# Patient Record
Sex: Male | Born: 1960 | Race: White | Hispanic: No | Marital: Married | Smoking: Never smoker
Health system: Southern US, Community
[De-identification: ages and names within clinical notes are randomized; demographics above are authoritative.]

## PROBLEM LIST (undated history)

## (undated) DIAGNOSIS — M109 Gout, unspecified: Secondary | ICD-10-CM

## (undated) DIAGNOSIS — M1990 Unspecified osteoarthritis, unspecified site: Secondary | ICD-10-CM

## (undated) DIAGNOSIS — R7989 Other specified abnormal findings of blood chemistry: Secondary | ICD-10-CM

## (undated) DIAGNOSIS — C61 Malignant neoplasm of prostate: Secondary | ICD-10-CM

## (undated) HISTORY — PX: CYSTECTOMY: SUR359

## (undated) HISTORY — PX: BONE MARROW BIOPSY: SHX199

## (undated) HISTORY — PX: KNEE SURGERY: SHX244

## (undated) HISTORY — DX: Gout, unspecified: M10.9

## (undated) HISTORY — DX: Malignant neoplasm of prostate: C61

---

## 2003-12-05 ENCOUNTER — Other Ambulatory Visit: Payer: Self-pay

## 2003-12-06 ENCOUNTER — Other Ambulatory Visit: Payer: Self-pay

## 2004-05-03 ENCOUNTER — Ambulatory Visit: Payer: Self-pay | Admitting: Internal Medicine

## 2004-05-18 ENCOUNTER — Ambulatory Visit: Payer: Self-pay | Admitting: Internal Medicine

## 2004-06-28 ENCOUNTER — Ambulatory Visit: Payer: Self-pay | Admitting: Internal Medicine

## 2004-07-18 ENCOUNTER — Ambulatory Visit: Payer: Self-pay | Admitting: Internal Medicine

## 2004-08-18 ENCOUNTER — Ambulatory Visit: Payer: Self-pay | Admitting: Internal Medicine

## 2004-09-07 ENCOUNTER — Other Ambulatory Visit: Payer: Self-pay

## 2004-09-15 ENCOUNTER — Ambulatory Visit: Payer: Self-pay | Admitting: Internal Medicine

## 2004-10-16 ENCOUNTER — Ambulatory Visit: Payer: Self-pay | Admitting: Internal Medicine

## 2004-11-15 ENCOUNTER — Ambulatory Visit: Payer: Self-pay | Admitting: Internal Medicine

## 2004-12-16 ENCOUNTER — Ambulatory Visit: Payer: Self-pay | Admitting: Internal Medicine

## 2005-01-15 ENCOUNTER — Ambulatory Visit: Payer: Self-pay | Admitting: Internal Medicine

## 2005-02-15 ENCOUNTER — Ambulatory Visit: Payer: Self-pay | Admitting: Internal Medicine

## 2005-03-18 ENCOUNTER — Ambulatory Visit: Payer: Self-pay | Admitting: Internal Medicine

## 2005-04-17 ENCOUNTER — Ambulatory Visit: Payer: Self-pay | Admitting: Internal Medicine

## 2005-05-18 ENCOUNTER — Ambulatory Visit: Payer: Self-pay | Admitting: Internal Medicine

## 2005-07-08 ENCOUNTER — Ambulatory Visit: Payer: Self-pay | Admitting: Internal Medicine

## 2005-07-18 ENCOUNTER — Ambulatory Visit: Payer: Self-pay | Admitting: Internal Medicine

## 2005-08-18 ENCOUNTER — Ambulatory Visit: Payer: Self-pay | Admitting: Internal Medicine

## 2005-09-15 ENCOUNTER — Ambulatory Visit: Payer: Self-pay | Admitting: Internal Medicine

## 2005-10-16 ENCOUNTER — Ambulatory Visit: Payer: Self-pay | Admitting: Internal Medicine

## 2005-12-26 ENCOUNTER — Ambulatory Visit: Payer: Self-pay | Admitting: Internal Medicine

## 2006-01-15 ENCOUNTER — Ambulatory Visit: Payer: Self-pay | Admitting: Internal Medicine

## 2006-02-15 ENCOUNTER — Ambulatory Visit: Payer: Self-pay | Admitting: Internal Medicine

## 2006-03-24 ENCOUNTER — Ambulatory Visit: Payer: Self-pay | Admitting: Internal Medicine

## 2006-04-17 ENCOUNTER — Ambulatory Visit: Payer: Self-pay | Admitting: Internal Medicine

## 2006-05-18 ENCOUNTER — Ambulatory Visit: Payer: Self-pay | Admitting: Internal Medicine

## 2006-06-27 ENCOUNTER — Ambulatory Visit: Payer: Self-pay | Admitting: Internal Medicine

## 2006-07-18 ENCOUNTER — Ambulatory Visit: Payer: Self-pay | Admitting: Internal Medicine

## 2006-08-23 ENCOUNTER — Ambulatory Visit: Payer: Self-pay | Admitting: Internal Medicine

## 2006-09-16 ENCOUNTER — Ambulatory Visit: Payer: Self-pay | Admitting: Internal Medicine

## 2006-10-17 ENCOUNTER — Ambulatory Visit: Payer: Self-pay | Admitting: Internal Medicine

## 2006-11-16 ENCOUNTER — Ambulatory Visit: Payer: Self-pay | Admitting: Internal Medicine

## 2006-12-17 ENCOUNTER — Ambulatory Visit: Payer: Self-pay | Admitting: Internal Medicine

## 2007-02-12 ENCOUNTER — Ambulatory Visit: Payer: Self-pay | Admitting: Internal Medicine

## 2007-02-16 ENCOUNTER — Ambulatory Visit: Payer: Self-pay | Admitting: Internal Medicine

## 2007-03-19 ENCOUNTER — Ambulatory Visit: Payer: Self-pay | Admitting: Internal Medicine

## 2007-04-03 ENCOUNTER — Ambulatory Visit: Payer: Self-pay | Admitting: Internal Medicine

## 2007-04-18 ENCOUNTER — Ambulatory Visit: Payer: Self-pay | Admitting: Internal Medicine

## 2007-06-06 ENCOUNTER — Ambulatory Visit: Payer: Self-pay | Admitting: Internal Medicine

## 2007-06-18 ENCOUNTER — Ambulatory Visit: Payer: Self-pay | Admitting: Internal Medicine

## 2007-07-24 ENCOUNTER — Ambulatory Visit: Payer: Self-pay | Admitting: Internal Medicine

## 2007-08-19 ENCOUNTER — Ambulatory Visit: Payer: Self-pay | Admitting: Internal Medicine

## 2007-10-02 ENCOUNTER — Ambulatory Visit: Payer: Self-pay | Admitting: Internal Medicine

## 2007-10-17 ENCOUNTER — Ambulatory Visit: Payer: Self-pay | Admitting: Internal Medicine

## 2007-11-29 ENCOUNTER — Ambulatory Visit: Payer: Self-pay | Admitting: Internal Medicine

## 2007-12-17 ENCOUNTER — Ambulatory Visit: Payer: Self-pay | Admitting: Internal Medicine

## 2008-01-28 DIAGNOSIS — D239 Other benign neoplasm of skin, unspecified: Secondary | ICD-10-CM

## 2008-01-28 HISTORY — DX: Other benign neoplasm of skin, unspecified: D23.9

## 2008-01-30 ENCOUNTER — Ambulatory Visit: Payer: Self-pay | Admitting: Internal Medicine

## 2008-02-16 ENCOUNTER — Ambulatory Visit: Payer: Self-pay | Admitting: Internal Medicine

## 2008-03-25 ENCOUNTER — Ambulatory Visit: Payer: Self-pay | Admitting: Internal Medicine

## 2008-04-17 ENCOUNTER — Ambulatory Visit: Payer: Self-pay | Admitting: Internal Medicine

## 2008-05-18 ENCOUNTER — Ambulatory Visit: Payer: Self-pay | Admitting: Internal Medicine

## 2008-06-17 ENCOUNTER — Ambulatory Visit: Payer: Self-pay | Admitting: Internal Medicine

## 2008-07-04 ENCOUNTER — Ambulatory Visit: Payer: Self-pay | Admitting: Internal Medicine

## 2008-07-18 ENCOUNTER — Ambulatory Visit: Payer: Self-pay

## 2008-07-25 ENCOUNTER — Ambulatory Visit: Payer: Self-pay | Admitting: Family Medicine

## 2008-08-22 ENCOUNTER — Ambulatory Visit: Payer: Self-pay | Admitting: Neurology

## 2008-09-02 ENCOUNTER — Ambulatory Visit: Payer: Self-pay | Admitting: Internal Medicine

## 2008-09-09 ENCOUNTER — Encounter: Payer: Self-pay | Admitting: Neurology

## 2008-09-15 ENCOUNTER — Encounter: Payer: Self-pay | Admitting: Neurology

## 2008-09-15 ENCOUNTER — Ambulatory Visit: Payer: Self-pay | Admitting: Internal Medicine

## 2008-10-16 ENCOUNTER — Encounter: Payer: Self-pay | Admitting: Neurology

## 2008-10-16 ENCOUNTER — Ambulatory Visit: Payer: Self-pay | Admitting: Internal Medicine

## 2008-10-24 ENCOUNTER — Ambulatory Visit: Payer: Self-pay | Admitting: Internal Medicine

## 2008-11-15 ENCOUNTER — Encounter: Payer: Self-pay | Admitting: Neurology

## 2008-11-15 ENCOUNTER — Ambulatory Visit: Payer: Self-pay | Admitting: Internal Medicine

## 2008-12-16 ENCOUNTER — Ambulatory Visit: Payer: Self-pay | Admitting: Internal Medicine

## 2009-01-02 ENCOUNTER — Ambulatory Visit: Payer: Self-pay | Admitting: Internal Medicine

## 2009-01-15 ENCOUNTER — Ambulatory Visit: Payer: Self-pay | Admitting: Internal Medicine

## 2009-02-27 ENCOUNTER — Ambulatory Visit: Payer: Self-pay | Admitting: Internal Medicine

## 2009-03-18 ENCOUNTER — Ambulatory Visit: Payer: Self-pay | Admitting: Internal Medicine

## 2009-04-24 ENCOUNTER — Ambulatory Visit: Payer: Self-pay | Admitting: Internal Medicine

## 2009-05-18 ENCOUNTER — Ambulatory Visit: Payer: Self-pay | Admitting: Internal Medicine

## 2009-07-18 ENCOUNTER — Ambulatory Visit: Payer: Self-pay | Admitting: Internal Medicine

## 2009-07-20 IMAGING — CR CERVICAL SPINE - 2-3 VIEW
1 series · 4 of 4 positions shown · non-contrast
Comparison: none

REASON FOR EXAM: Pain
COMMENTS:

[Series 2: view not recorded · 0.17mm/px · 4 of 4 slices shown]
[im 1/4]
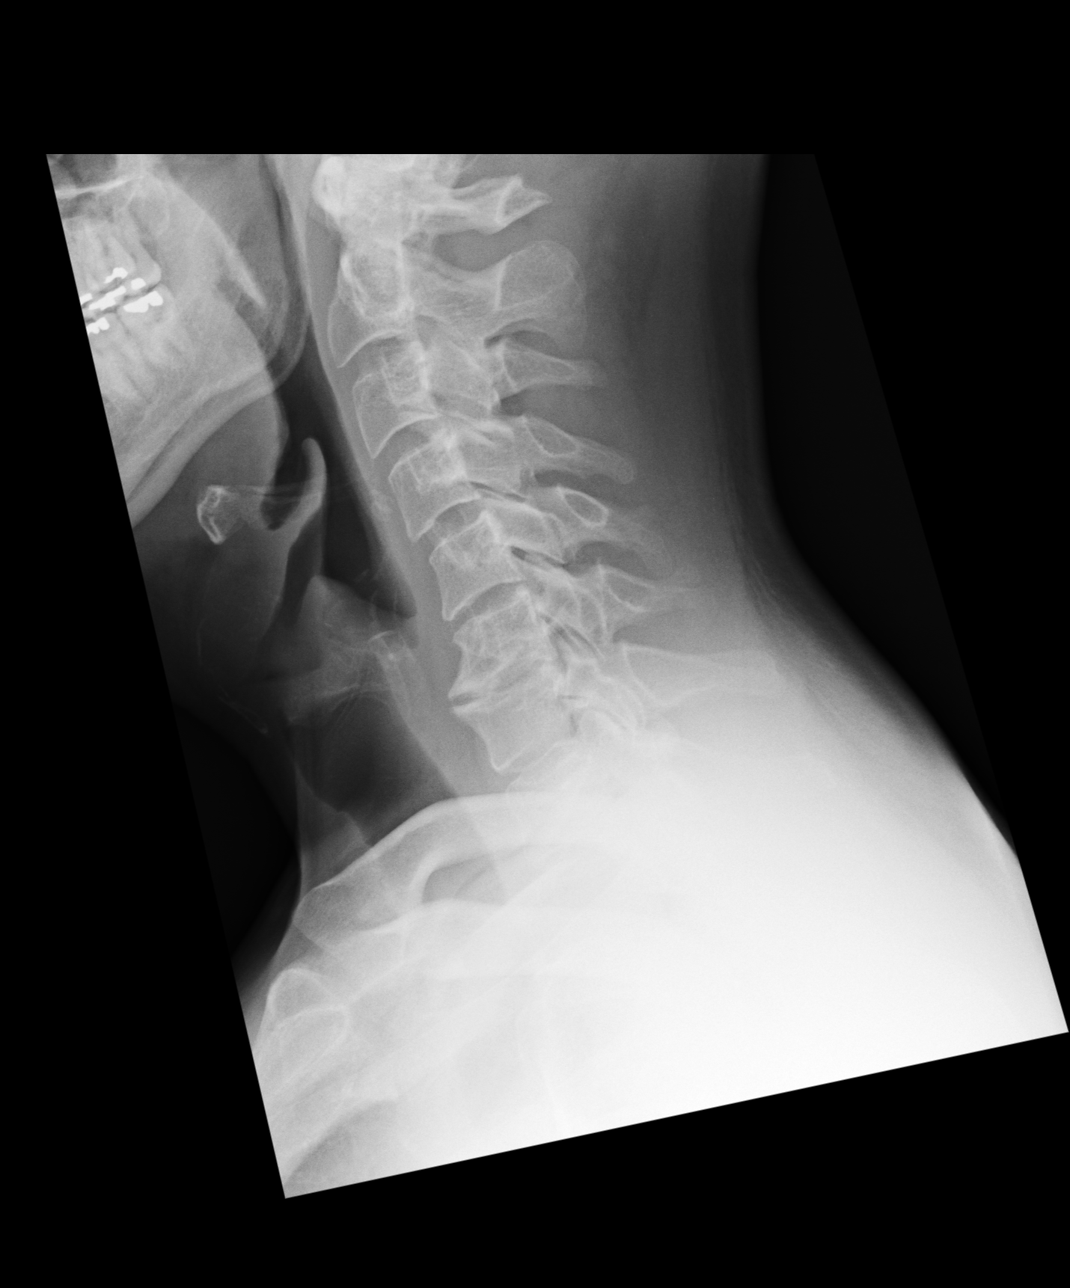
[im 2/4]
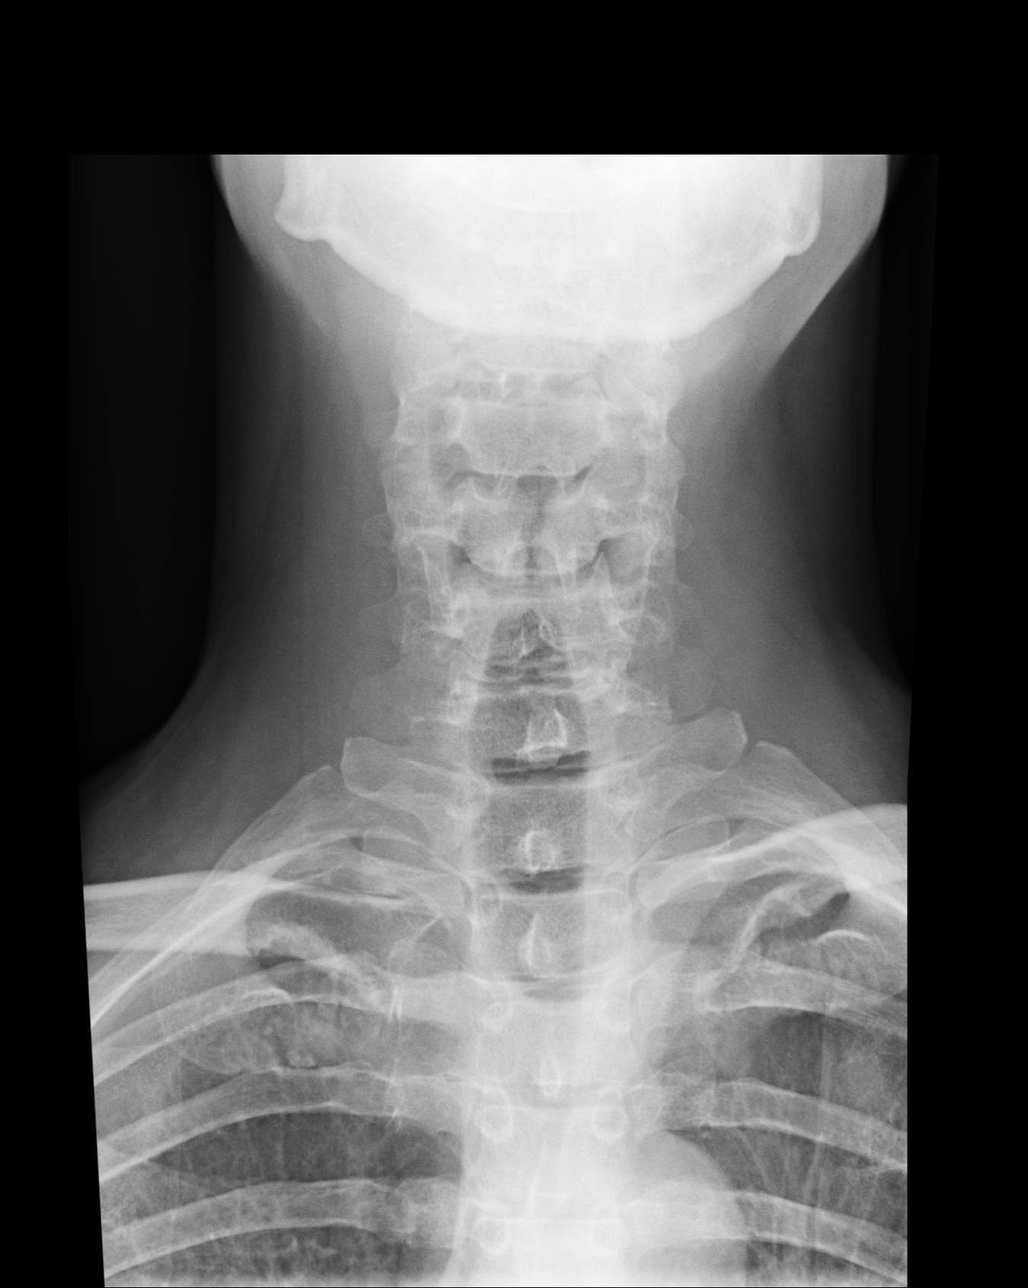
[im 3/4]
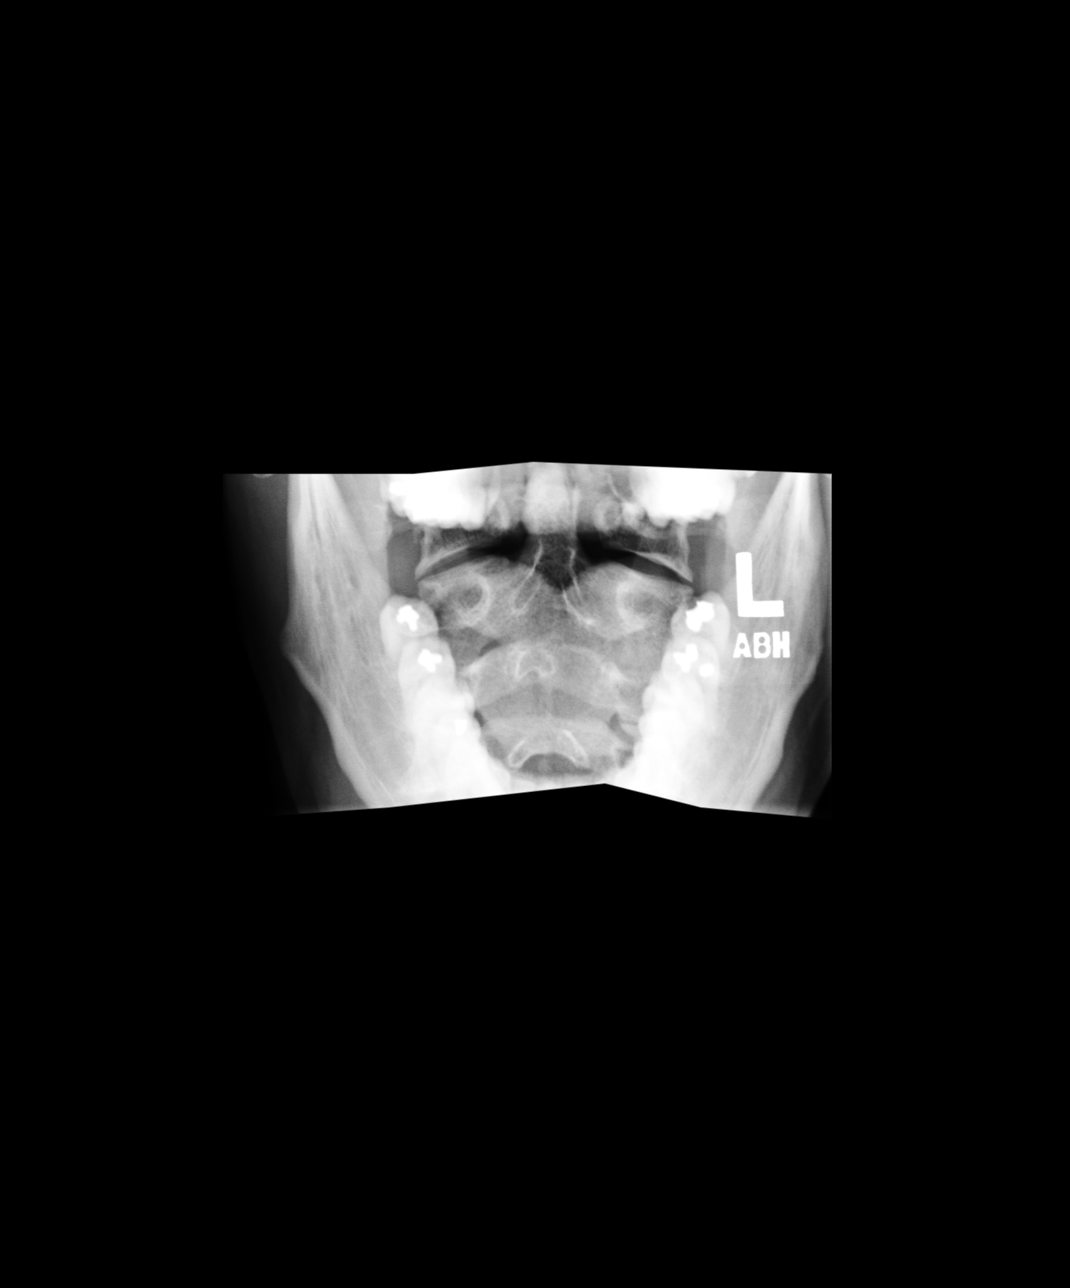
[im 4/4]
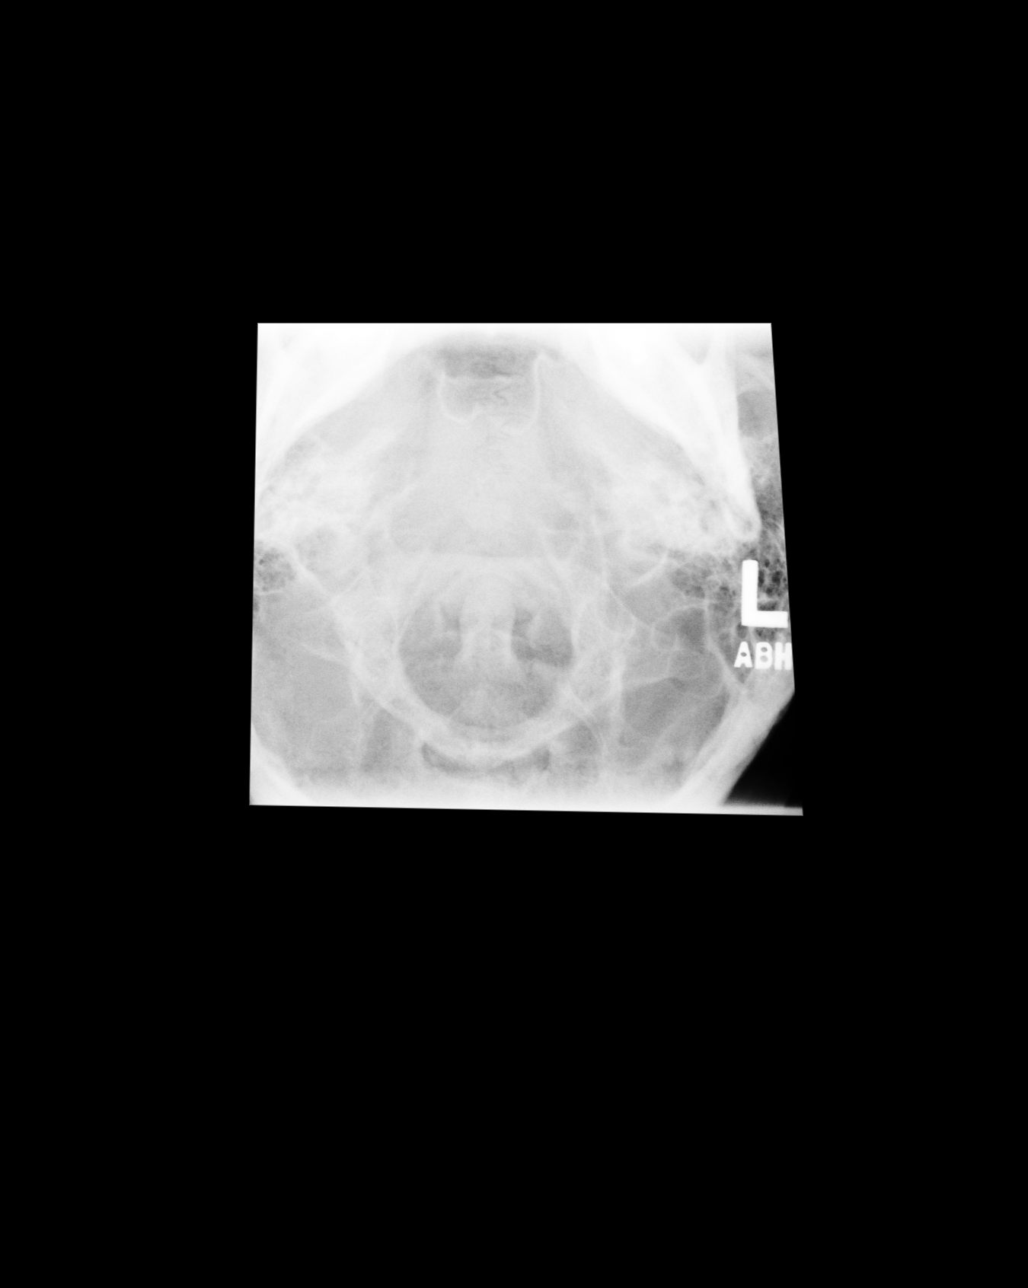

[4 of 4 positions shown; findings below may reference images not displayed]

PROCEDURE:     KDR - KDXR C-SPINE AP AND LATERAL  - July 25, 2008 [DATE]

RESULT:     The patient is reporting approximately one month history of left
upper extremity radicular symptoms.

The cervical vertebral bodies are preserved in height. There is considerable
degenerative change at the C6-7 disc with a large anterior osteophyte as
well as mild narrowing of the disc space. The posterior elements are grossly
intact. The prevertebral soft tissue spaces are normal. The odontoid is
intact and the lateral masses of C1 align normally with those of C2.
IMPRESSION: There is degenerative change at the C6-7 level. Further
evaluation with MRI would be useful to allow evaluation of the neural
foramina.

## 2009-08-14 ENCOUNTER — Ambulatory Visit: Payer: Self-pay | Admitting: Internal Medicine

## 2009-08-17 IMAGING — CR ORBITS FOR FOREIGN BODY - 2 VIEW
1 series · 3 of 3 positions shown · non-contrast
Comparison: none

REASON FOR EXAM: hx : metal in eye evaluate for residuak fragment prior
to MRI
COMMENTS:

PROCEDURE:     DXR - DXR ORBITS FOR MRI CLEARANCE  - August 22, 2008  [DATE]
RESULT:     Three views of the orbits were obtained prior to MRI
examination. No radiodense orbital foreign body is seen that would preclude
MRI.

[Series 1: view not recorded · 0.17mm/px · 3 of 3 slices shown]
[im 1/3]
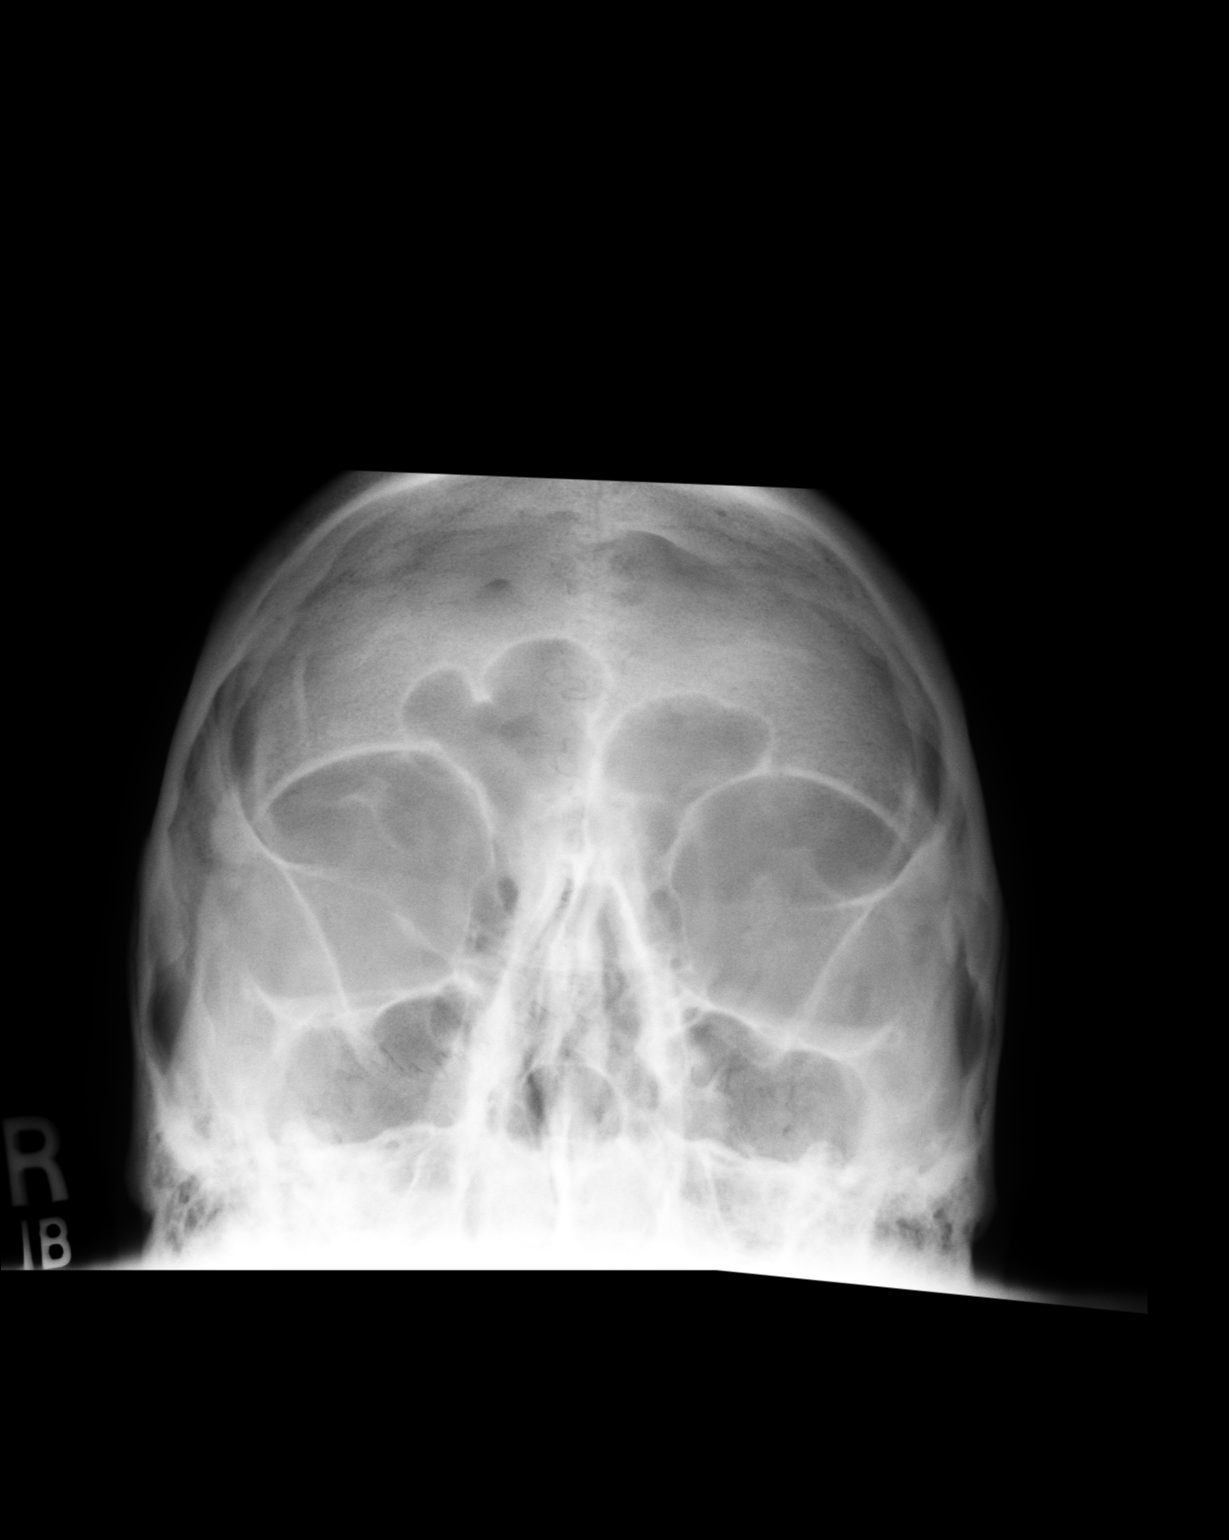
[im 2/3]
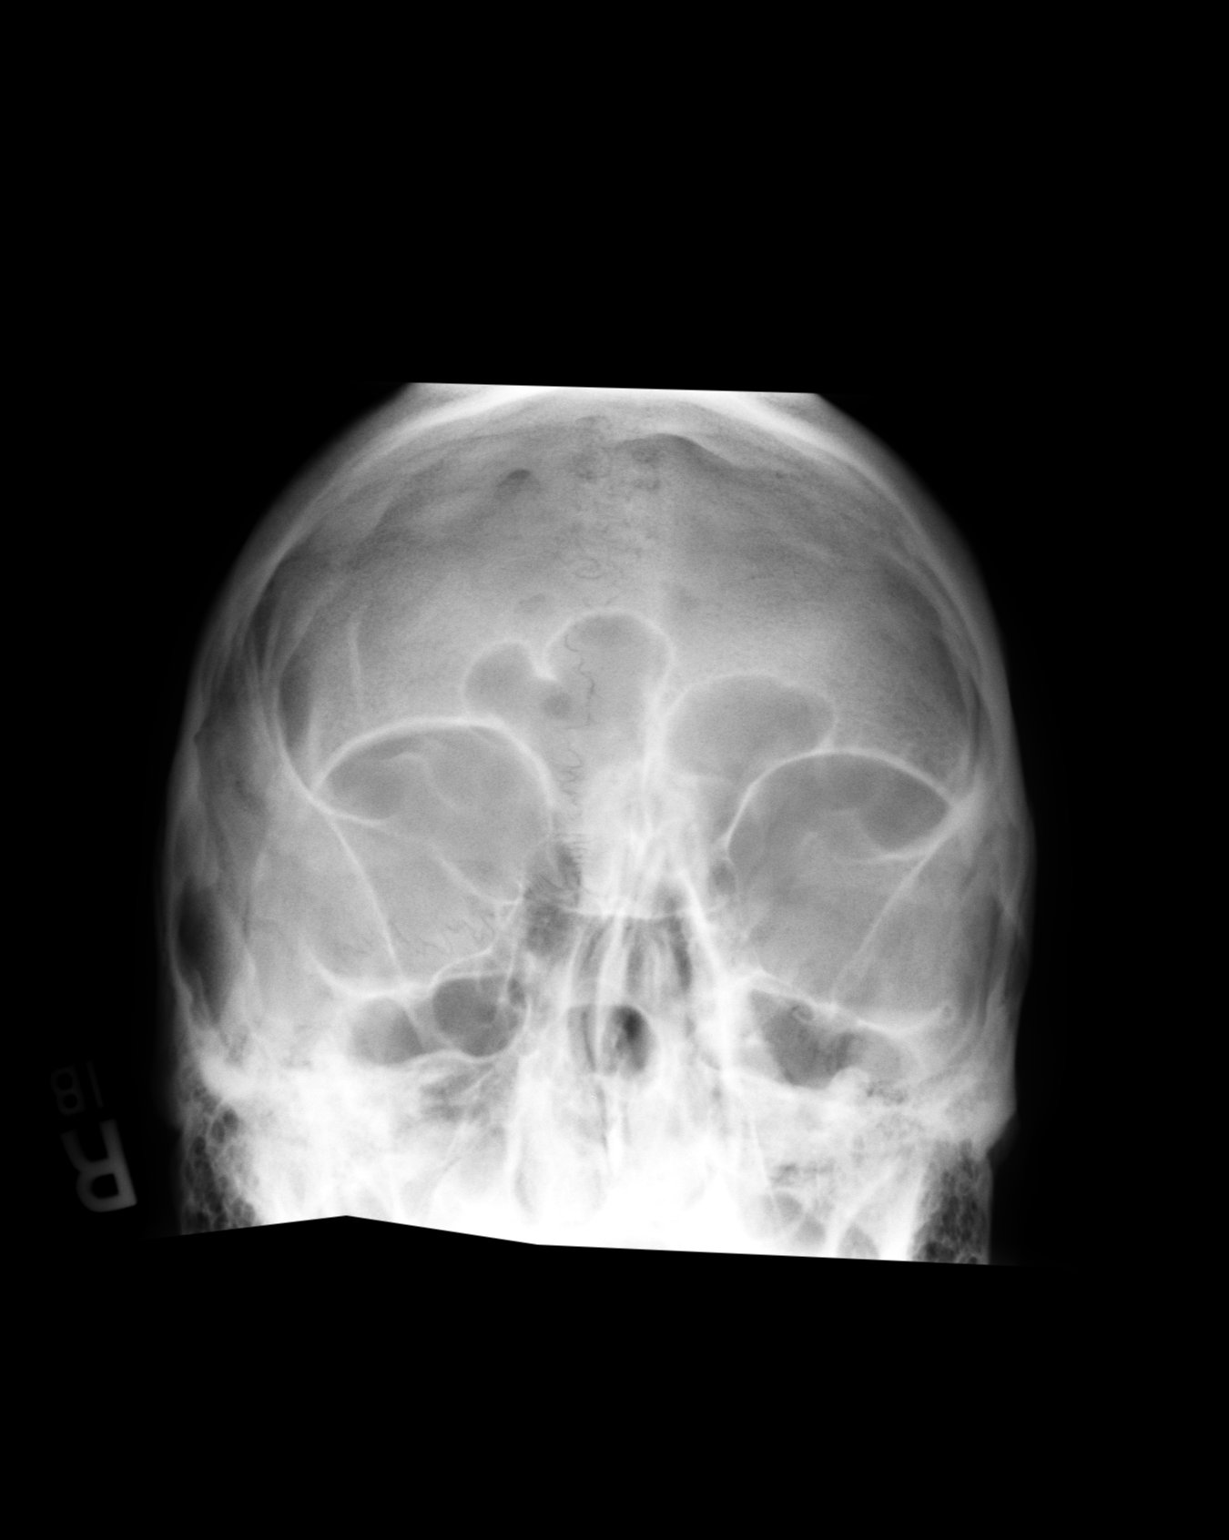
[im 3/3]
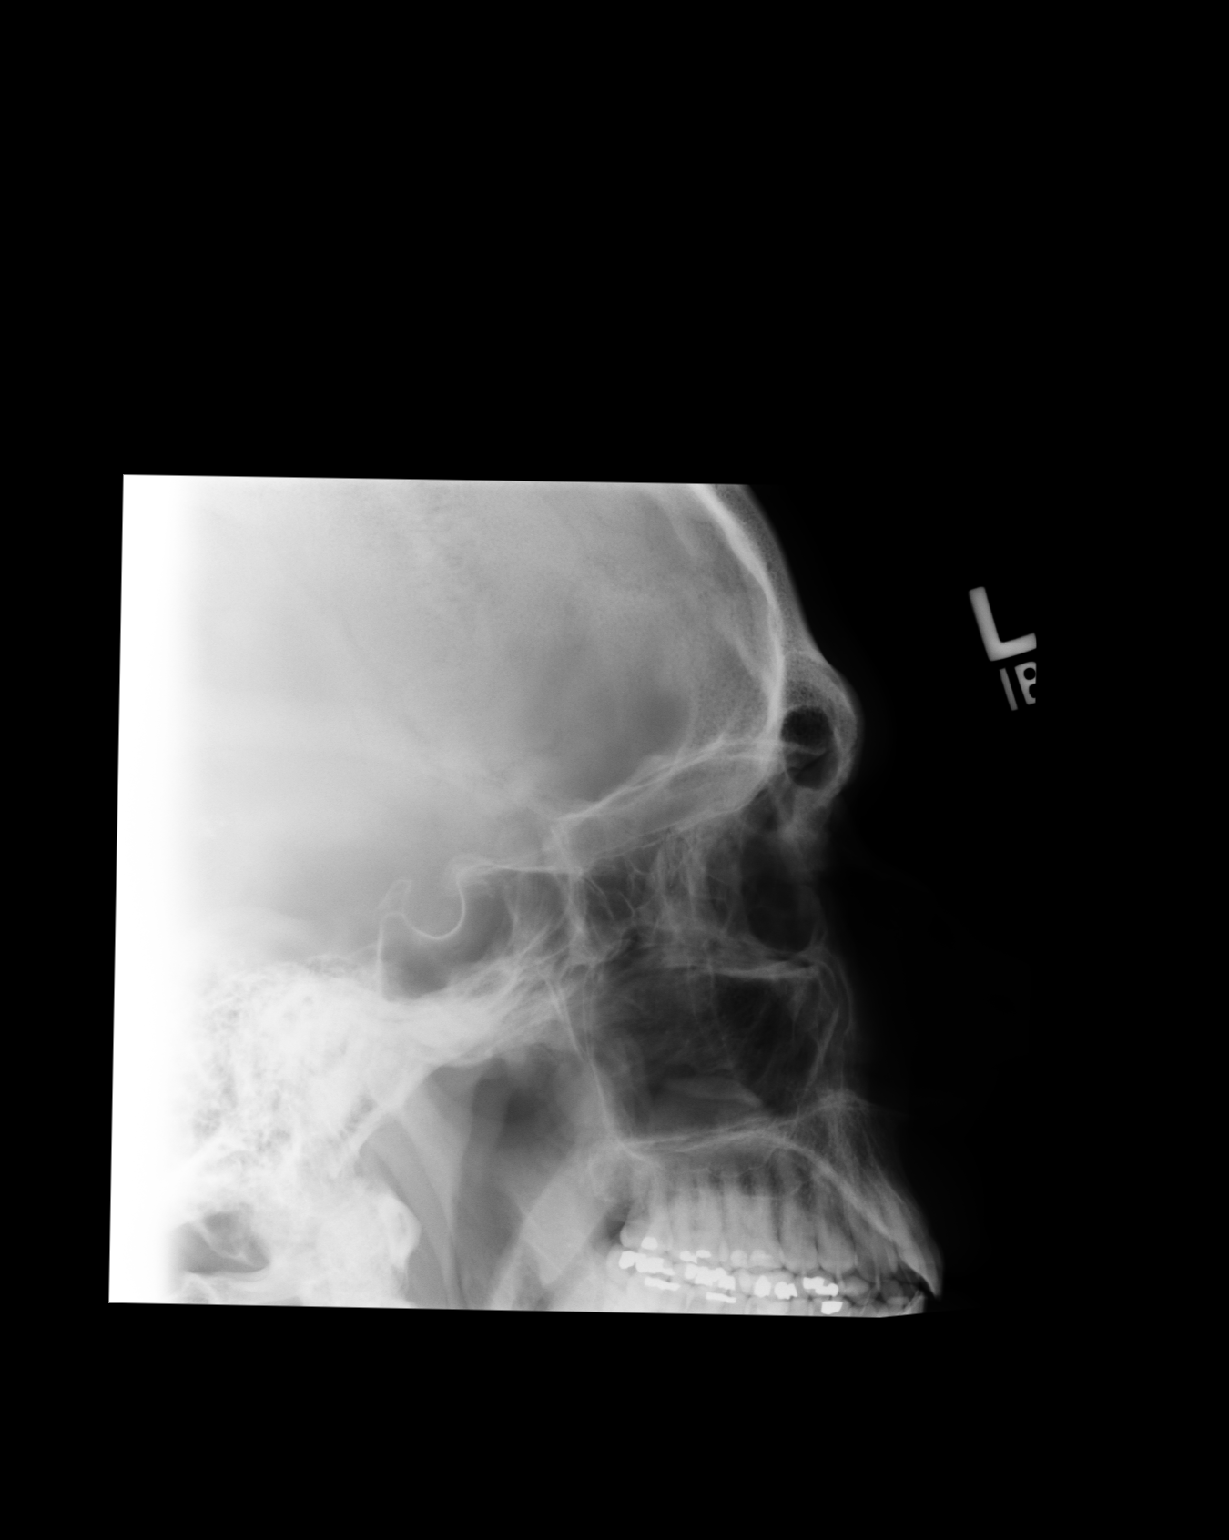

[3 of 3 positions shown; findings below may reference images not displayed]

IMPRESSION: 1. The patient is cleared for MRI.

## 2009-08-18 ENCOUNTER — Ambulatory Visit: Payer: Self-pay | Admitting: Internal Medicine

## 2009-10-12 DIAGNOSIS — C4491 Basal cell carcinoma of skin, unspecified: Secondary | ICD-10-CM

## 2009-10-12 HISTORY — DX: Basal cell carcinoma of skin, unspecified: C44.91

## 2009-10-13 ENCOUNTER — Ambulatory Visit: Payer: Self-pay | Admitting: Internal Medicine

## 2009-10-16 ENCOUNTER — Ambulatory Visit: Payer: Self-pay | Admitting: Internal Medicine

## 2009-12-04 ENCOUNTER — Ambulatory Visit: Payer: Self-pay | Admitting: Internal Medicine

## 2009-12-16 ENCOUNTER — Ambulatory Visit: Payer: Self-pay | Admitting: Internal Medicine

## 2010-03-18 ENCOUNTER — Ambulatory Visit: Payer: Self-pay | Admitting: Internal Medicine

## 2010-03-26 ENCOUNTER — Ambulatory Visit: Payer: Self-pay | Admitting: Internal Medicine

## 2010-04-17 ENCOUNTER — Ambulatory Visit: Payer: Self-pay | Admitting: Internal Medicine

## 2010-05-21 ENCOUNTER — Ambulatory Visit: Payer: Self-pay | Admitting: Internal Medicine

## 2010-06-17 ENCOUNTER — Ambulatory Visit: Payer: Self-pay | Admitting: Internal Medicine

## 2010-07-18 ENCOUNTER — Ambulatory Visit: Payer: Self-pay | Admitting: Internal Medicine

## 2010-09-10 ENCOUNTER — Ambulatory Visit: Payer: Self-pay | Admitting: Internal Medicine

## 2010-09-16 ENCOUNTER — Ambulatory Visit: Payer: Self-pay | Admitting: Internal Medicine

## 2010-12-03 ENCOUNTER — Ambulatory Visit: Payer: Self-pay | Admitting: Internal Medicine

## 2010-12-17 ENCOUNTER — Ambulatory Visit: Payer: Self-pay | Admitting: Internal Medicine

## 2011-02-08 ENCOUNTER — Ambulatory Visit: Payer: Self-pay | Admitting: Internal Medicine

## 2011-02-16 ENCOUNTER — Ambulatory Visit: Payer: Self-pay | Admitting: Internal Medicine

## 2011-03-25 ENCOUNTER — Ambulatory Visit: Payer: Self-pay | Admitting: Internal Medicine

## 2011-04-18 ENCOUNTER — Ambulatory Visit: Payer: Self-pay | Admitting: Internal Medicine

## 2011-07-20 ENCOUNTER — Ambulatory Visit: Payer: Self-pay | Admitting: Internal Medicine

## 2011-07-20 LAB — CBC CANCER CENTER
Basophil %: 0.7 %
Eosinophil %: 5 %
HCT: 38.9 % — ABNORMAL LOW (ref 40.0–52.0)
Lymphocyte #: 1.6 x10 3/mm (ref 1.0–3.6)
MCH: 30.9 pg (ref 26.0–34.0)
MCV: 92 fL (ref 80–100)
Monocyte %: 6.8 %
Neutrophil #: 5.5 x10 3/mm (ref 1.4–6.5)
Platelet: 191 x10 3/mm (ref 150–440)
RDW: 13.9 % (ref 11.5–14.5)
WBC: 8.1 x10 3/mm (ref 3.8–10.6)

## 2011-07-20 LAB — CREATININE, SERUM
Creatinine: 0.99 mg/dL (ref 0.60–1.30)
EGFR (African American): >60
EGFR (Non-African Amer.): >60

## 2011-07-20 LAB — HEPATIC FUNCTION PANEL A (ARMC)
Albumin: 3.3 g/dL — ABNORMAL LOW (ref 3.4–5.0)
Alkaline Phosphatase: 59 U/L (ref 50–136)
SGOT(AST): 22 U/L (ref 15–37)
SGPT (ALT): 25 U/L
Total Protein: 6.5 g/dL (ref 6.4–8.2)

## 2011-08-19 ENCOUNTER — Ambulatory Visit: Payer: Self-pay | Admitting: Internal Medicine

## 2011-09-20 ENCOUNTER — Ambulatory Visit: Payer: Self-pay | Admitting: Internal Medicine

## 2011-09-20 LAB — CBC CANCER CENTER
Basophil #: 0.1 x10 3/mm (ref 0.0–0.1)
Eosinophil #: 0.4 x10 3/mm (ref 0.0–0.7)
Eosinophil %: 5.1 %
HGB: 13.5 g/dL (ref 13.0–18.0)
Lymphocyte %: 20.5 %
MCH: 30.9 pg (ref 26.0–34.0)
MCHC: 33.8 g/dL (ref 32.0–36.0)
Monocyte #: 0.5 x10 3/mm (ref 0.0–0.7)
Monocyte %: 6.4 %
Neutrophil #: 5.5 x10 3/mm (ref 1.4–6.5)
Platelet: 261 x10 3/mm (ref 150–440)
RDW: 14.1 % (ref 11.5–14.5)
WBC: 8.2 x10 3/mm (ref 3.8–10.6)

## 2011-09-20 LAB — HEPATIC FUNCTION PANEL A (ARMC)
Alkaline Phosphatase: 48 U/L — ABNORMAL LOW (ref 50–136)
SGOT(AST): 37 U/L (ref 15–37)
Total Protein: 6.8 g/dL (ref 6.4–8.2)

## 2011-09-20 LAB — CREATININE, SERUM
Creatinine: 1.08 mg/dL (ref 0.60–1.30)
EGFR (African American): >60

## 2011-10-17 ENCOUNTER — Ambulatory Visit: Payer: Self-pay | Admitting: Internal Medicine

## 2011-11-09 LAB — CBC CANCER CENTER
Basophil #: 0.1 x10 3/mm (ref 0.0–0.1)
Basophil %: 1.7 %
Eosinophil #: 0.4 x10 3/mm (ref 0.0–0.7)
HCT: 42.2 % (ref 40.0–52.0)
MCH: 30.5 pg (ref 26.0–34.0)
MCV: 94 fL (ref 80–100)
Monocyte #: 0.5 x10 3/mm (ref 0.2–1.0)
Monocyte %: 6.9 %
Neutrophil #: 4.8 x10 3/mm (ref 1.4–6.5)
Neutrophil %: 63.8 %
Platelet: 350 x10 3/mm (ref 150–440)
RBC: 4.5 10*6/uL (ref 4.40–5.90)
RDW: 14 % (ref 11.5–14.5)
WBC: 7.5 x10 3/mm (ref 3.8–10.6)

## 2011-11-09 LAB — IRON AND TIBC
Iron Bind.Cap.(Total): 328 ug/dL (ref 250–450)
Iron Saturation: 30 %
Unbound Iron-Bind.Cap.: 229 ug/dL

## 2011-11-09 LAB — HEPATIC FUNCTION PANEL A (ARMC)
Alkaline Phosphatase: 61 U/L (ref 50–136)
Bilirubin, Direct: 0.1 mg/dL (ref 0.00–0.20)
Bilirubin,Total: 0.5 mg/dL (ref 0.2–1.0)
SGOT(AST): 24 U/L (ref 15–37)

## 2011-11-16 ENCOUNTER — Ambulatory Visit: Payer: Self-pay | Admitting: Internal Medicine

## 2012-01-27 ENCOUNTER — Ambulatory Visit: Payer: Self-pay | Admitting: Internal Medicine

## 2012-01-27 LAB — CBC CANCER CENTER
Basophil #: 0.2 x10 3/mm — ABNORMAL HIGH (ref 0.0–0.1)
Eosinophil #: 0.5 x10 3/mm (ref 0.0–0.7)
HGB: 13.5 g/dL (ref 13.0–18.0)
Lymphocyte #: 1.7 x10 3/mm (ref 1.0–3.6)
MCHC: 32.9 g/dL (ref 32.0–36.0)
Monocyte #: 0.6 x10 3/mm (ref 0.2–1.0)
Neutrophil #: 5.4 x10 3/mm (ref 1.4–6.5)
Neutrophil %: 64.7 %
RDW: 13.9 % (ref 11.5–14.5)
WBC: 8.3 x10 3/mm (ref 3.8–10.6)

## 2012-01-27 LAB — CREATININE, SERUM
Creatinine: 1.08 mg/dL
EGFR (African American): >60
EGFR (Non-African Amer.): >60

## 2012-01-27 LAB — HEPATIC FUNCTION PANEL A (ARMC)
Alkaline Phosphatase: 65 U/L (ref 50–136)
Bilirubin,Total: 0.6 mg/dL (ref 0.2–1.0)
SGOT(AST): 20 U/L (ref 15–37)
SGPT (ALT): 23 U/L
Total Protein: 6.9 g/dL (ref 6.4–8.2)

## 2012-02-16 ENCOUNTER — Ambulatory Visit: Payer: Self-pay | Admitting: Internal Medicine

## 2012-02-29 LAB — CBC CANCER CENTER
Basophil #: 0.2 x10 3/mm — ABNORMAL HIGH (ref 0.0–0.1)
Basophil %: 2.4 %
Eosinophil #: 0.4 x10 3/mm (ref 0.0–0.7)
Eosinophil %: 6.2 %
HGB: 13 g/dL (ref 13.0–18.0)
Lymphocyte %: 21.5 %
MCH: 30.6 pg (ref 26.0–34.0)
Monocyte #: 0.5 x10 3/mm (ref 0.2–1.0)
Monocyte %: 7.7 %
Neutrophil %: 62.2 %
RBC: 4.25 10*6/uL — ABNORMAL LOW (ref 4.40–5.90)
RDW: 13.7 % (ref 11.5–14.5)

## 2012-02-29 LAB — CREATININE, SERUM
EGFR (African American): >60
EGFR (Non-African Amer.): >60

## 2012-02-29 LAB — HEPATIC FUNCTION PANEL A (ARMC)
Alkaline Phosphatase: 60 U/L (ref 50–136)
SGOT(AST): 22 U/L (ref 15–37)
SGPT (ALT): 29 U/L (ref 12–78)

## 2012-03-18 ENCOUNTER — Ambulatory Visit: Payer: Self-pay | Admitting: Internal Medicine

## 2012-06-18 ENCOUNTER — Ambulatory Visit: Payer: Self-pay | Admitting: Internal Medicine

## 2012-06-18 LAB — CBC CANCER CENTER
Basophil #: 0.2 x10 3/mm — ABNORMAL HIGH (ref 0.0–0.1)
Eosinophil #: 0.5 x10 3/mm (ref 0.0–0.7)
Eosinophil %: 6.7 %
HCT: 40.3 % (ref 40.0–52.0)
Lymphocyte #: 1.6 x10 3/mm (ref 1.0–3.6)
MCHC: 33 g/dL (ref 32.0–36.0)
MCV: 92 fL (ref 80–100)
Monocyte %: 6.7 %
Neutrophil #: 4.4 x10 3/mm (ref 1.4–6.5)
RBC: 4.37 10*6/uL — ABNORMAL LOW (ref 4.40–5.90)
RDW: 14.1 % (ref 11.5–14.5)
WBC: 7.1 x10 3/mm (ref 3.8–10.6)

## 2012-06-18 LAB — IRON AND TIBC
Iron Bind.Cap.(Total): 331 ug/dL (ref 250–450)
Iron: 126 ug/dL (ref 65–175)
Unbound Iron-Bind.Cap.: 205 ug/dL

## 2012-06-18 LAB — HEPATIC FUNCTION PANEL A (ARMC)
Albumin: 3.7 g/dL (ref 3.4–5.0)
Bilirubin, Direct: 0.1 mg/dL (ref 0.00–0.20)
Total Protein: 6.7 g/dL (ref 6.4–8.2)

## 2012-06-18 LAB — CREATININE, SERUM
Creatinine: 0.97 mg/dL (ref 0.60–1.30)
EGFR (African American): >60

## 2012-07-18 ENCOUNTER — Ambulatory Visit: Payer: Self-pay | Admitting: Internal Medicine

## 2012-08-18 ENCOUNTER — Ambulatory Visit: Payer: Self-pay | Admitting: Internal Medicine

## 2012-08-20 LAB — CBC CANCER CENTER
Basophil #: 0.1 x10 3/mm (ref 0.0–0.1)
Basophil %: 1.4 %
Eosinophil #: 0.4 x10 3/mm (ref 0.0–0.7)
Eosinophil %: 3.8 %
HCT: 38.9 % — ABNORMAL LOW (ref 40.0–52.0)
Lymphocyte %: 15.5 %
MCH: 30.3 pg (ref 26.0–34.0)
MCHC: 32.8 g/dL (ref 32.0–36.0)
MCV: 93 fL (ref 80–100)
Monocyte #: 0.6 x10 3/mm (ref 0.2–1.0)
Platelet: 365 x10 3/mm (ref 150–440)
RBC: 4.2 10*6/uL — ABNORMAL LOW (ref 4.40–5.90)
WBC: 9.3 x10 3/mm (ref 3.8–10.6)

## 2012-08-20 LAB — CREATININE, SERUM
Creatinine: 0.98 mg/dL (ref 0.60–1.30)
EGFR (African American): >60
EGFR (Non-African Amer.): >60

## 2012-08-20 LAB — HEPATIC FUNCTION PANEL A (ARMC)
Alkaline Phosphatase: 55 U/L (ref 50–136)
Bilirubin, Direct: 0.1 mg/dL (ref 0.00–0.20)
SGOT(AST): 24 U/L (ref 15–37)
SGPT (ALT): 25 U/L (ref 12–78)
Total Protein: 6.4 g/dL (ref 6.4–8.2)

## 2012-09-15 ENCOUNTER — Ambulatory Visit: Payer: Self-pay | Admitting: Internal Medicine

## 2012-10-10 LAB — CREATININE, SERUM
Creatinine: 1.3 mg/dL (ref 0.60–1.30)
EGFR (Non-African Amer.): >60

## 2012-10-10 LAB — HEPATIC FUNCTION PANEL A (ARMC)
Albumin: 3.7 g/dL (ref 3.4–5.0)
Alkaline Phosphatase: 51 U/L (ref 50–136)
Bilirubin, Direct: 0.1 mg/dL (ref 0.00–0.20)
Bilirubin,Total: 0.6 mg/dL (ref 0.2–1.0)
SGOT(AST): 26 U/L (ref 15–37)
SGPT (ALT): 26 U/L (ref 12–78)
Total Protein: 6.7 g/dL (ref 6.4–8.2)

## 2012-10-10 LAB — CBC CANCER CENTER
Eosinophil #: 0.4 x10 3/mm (ref 0.0–0.7)
HCT: 40.4 % (ref 40.0–52.0)
HGB: 13.5 g/dL (ref 13.0–18.0)
Lymphocyte #: 1.4 x10 3/mm (ref 1.0–3.6)
MCH: 30.8 pg (ref 26.0–34.0)
MCHC: 33.5 g/dL (ref 32.0–36.0)
MCV: 92 fL (ref 80–100)
Neutrophil #: 5.6 x10 3/mm (ref 1.4–6.5)
Neutrophil %: 69.2 %
WBC: 8 x10 3/mm (ref 3.8–10.6)

## 2012-10-16 ENCOUNTER — Ambulatory Visit: Payer: Self-pay | Admitting: Internal Medicine

## 2012-11-15 ENCOUNTER — Ambulatory Visit: Payer: Self-pay | Admitting: Internal Medicine

## 2012-12-12 LAB — CBC CANCER CENTER
Eosinophil #: 0.5 x10 3/mm (ref 0.0–0.7)
Eosinophil %: 6 %
HGB: 13.8 g/dL (ref 13.0–18.0)
Lymphocyte #: 1.8 x10 3/mm (ref 1.0–3.6)
MCH: 30.9 pg (ref 26.0–34.0)
MCHC: 33.7 g/dL (ref 32.0–36.0)
MCV: 92 fL (ref 80–100)
Neutrophil #: 5.2 x10 3/mm (ref 1.4–6.5)
Platelet: 536 x10 3/mm — ABNORMAL HIGH (ref 150–440)
RBC: 4.48 10*6/uL (ref 4.40–5.90)
RDW: 13.8 % (ref 11.5–14.5)

## 2012-12-12 LAB — HEPATIC FUNCTION PANEL A (ARMC)
Albumin: 3.7 g/dL (ref 3.4–5.0)
Bilirubin, Direct: 0.1 mg/dL (ref 0.00–0.20)
SGOT(AST): 23 U/L (ref 15–37)
SGPT (ALT): 27 U/L (ref 12–78)
Total Protein: 6.8 g/dL (ref 6.4–8.2)

## 2012-12-12 LAB — CREATININE, SERUM: Creatinine: 1.1 mg/dL (ref 0.60–1.30)

## 2012-12-16 ENCOUNTER — Ambulatory Visit: Payer: Self-pay | Admitting: Internal Medicine

## 2013-01-15 ENCOUNTER — Ambulatory Visit: Payer: Self-pay | Admitting: Internal Medicine

## 2013-01-30 LAB — CBC CANCER CENTER
Basophil %: 3.4 %
Eosinophil #: 0.5 x10 3/mm (ref 0.0–0.7)
HCT: 41 % (ref 40.0–52.0)
Lymphocyte #: 1.7 x10 3/mm (ref 1.0–3.6)
Lymphocyte %: 20.5 %
MCHC: 33.9 g/dL (ref 32.0–36.0)
MCV: 91 fL (ref 80–100)
Monocyte #: 0.6 x10 3/mm (ref 0.2–1.0)
Monocyte %: 6.5 %
Neutrophil #: 5.4 x10 3/mm (ref 1.4–6.5)
Neutrophil %: 63.9 %
Platelet: 441 x10 3/mm — ABNORMAL HIGH (ref 150–440)
RBC: 4.49 10*6/uL (ref 4.40–5.90)
WBC: 8.4 x10 3/mm (ref 3.8–10.6)

## 2013-01-30 LAB — IRON AND TIBC
Iron Bind.Cap.(Total): 349 ug/dL (ref 250–450)
Unbound Iron-Bind.Cap.: 252 ug/dL

## 2013-01-30 LAB — CREATININE, SERUM: EGFR (African American): >60

## 2013-01-30 LAB — HEPATIC FUNCTION PANEL A (ARMC)
Albumin: 3.8 g/dL (ref 3.4–5.0)
Bilirubin, Direct: 0.1 mg/dL (ref 0.00–0.20)
SGOT(AST): 28 U/L (ref 15–37)
SGPT (ALT): 24 U/L (ref 12–78)

## 2013-02-15 ENCOUNTER — Ambulatory Visit: Payer: Self-pay | Admitting: Internal Medicine

## 2013-03-27 ENCOUNTER — Ambulatory Visit: Payer: Self-pay | Admitting: Internal Medicine

## 2013-05-22 ENCOUNTER — Ambulatory Visit: Payer: Self-pay | Admitting: Internal Medicine

## 2013-05-24 LAB — CBC CANCER CENTER
Basophil #: 0.3 x10 3/mm — ABNORMAL HIGH (ref 0.0–0.1)
Eosinophil %: 4.4 %
Lymphocyte #: 1.9 x10 3/mm (ref 1.0–3.6)
MCH: 30.8 pg (ref 26.0–34.0)
MCV: 94 fL (ref 80–100)
Monocyte %: 7.3 %
Neutrophil #: 5.7 x10 3/mm (ref 1.4–6.5)
Neutrophil %: 64.1 %
Platelet: 447 x10 3/mm — ABNORMAL HIGH (ref 150–440)
RBC: 4.46 10*6/uL (ref 4.40–5.90)
RDW: 13.7 % (ref 11.5–14.5)
WBC: 8.9 x10 3/mm (ref 3.8–10.6)

## 2013-05-24 LAB — CREATININE, SERUM
Creatinine: 1.14 mg/dL (ref 0.60–1.30)
EGFR (African American): >60
EGFR (Non-African Amer.): >60

## 2013-05-24 LAB — HEPATIC FUNCTION PANEL A (ARMC)
Albumin: 3.5 g/dL (ref 3.4–5.0)
Alkaline Phosphatase: 48 U/L — ABNORMAL LOW (ref 50–136)
Bilirubin,Total: 0.6 mg/dL (ref 0.2–1.0)
SGOT(AST): 22 U/L (ref 15–37)
SGPT (ALT): 25 U/L (ref 12–78)

## 2013-06-17 ENCOUNTER — Ambulatory Visit: Payer: Self-pay | Admitting: Internal Medicine

## 2013-08-15 ENCOUNTER — Ambulatory Visit: Payer: Self-pay | Admitting: Internal Medicine

## 2013-08-16 LAB — HEPATIC FUNCTION PANEL A (ARMC)
ALT: 27 U/L (ref 12–78)
AST: 23 U/L (ref 15–37)
Albumin: 3.5 g/dL (ref 3.4–5.0)
Alkaline Phosphatase: 43 U/L — ABNORMAL LOW
BILIRUBIN DIRECT: 0.1 mg/dL (ref 0.00–0.20)
Bilirubin,Total: 0.4 mg/dL (ref 0.2–1.0)
Total Protein: 6.5 g/dL (ref 6.4–8.2)

## 2013-08-16 LAB — CBC CANCER CENTER
BASOS PCT: 2.5 %
Basophil #: 0.2 x10 3/mm — ABNORMAL HIGH (ref 0.0–0.1)
EOS ABS: 0.4 x10 3/mm (ref 0.0–0.7)
Eosinophil %: 4.9 %
HCT: 42.9 % (ref 40.0–52.0)
HGB: 13.9 g/dL (ref 13.0–18.0)
LYMPHS PCT: 20.4 %
Lymphocyte #: 1.8 x10 3/mm (ref 1.0–3.6)
MCH: 29.7 pg (ref 26.0–34.0)
MCHC: 32.4 g/dL (ref 32.0–36.0)
MCV: 92 fL (ref 80–100)
Monocyte #: 0.6 x10 3/mm (ref 0.2–1.0)
Monocyte %: 6.8 %
NEUTROS ABS: 5.8 x10 3/mm (ref 1.4–6.5)
Neutrophil %: 65.4 %
PLATELETS: 466 x10 3/mm — AB (ref 150–440)
RBC: 4.69 10*6/uL (ref 4.40–5.90)
RDW: 13.1 % (ref 11.5–14.5)
WBC: 8.8 x10 3/mm (ref 3.8–10.6)

## 2013-08-16 LAB — CREATININE, SERUM
Creatinine: 1.21 mg/dL (ref 0.60–1.30)
EGFR (Non-African Amer.): >60

## 2013-08-18 ENCOUNTER — Ambulatory Visit: Payer: Self-pay | Admitting: Internal Medicine

## 2013-11-07 ENCOUNTER — Ambulatory Visit: Payer: Self-pay | Admitting: Internal Medicine

## 2014-01-01 LAB — BASIC METABOLIC PANEL
BUN: 18 mg/dL (ref 4–21)
CREATININE: 1 mg/dL (ref 0.6–1.3)
Glucose: 89 mg/dL
Potassium: 5.5 mmol/L — AB (ref 3.4–5.3)
SODIUM: 140 mmol/L (ref 137–147)

## 2014-01-01 LAB — TSH: TSH: 4.31 u[IU]/mL (ref 0.41–5.90)

## 2014-01-01 LAB — LIPID PANEL
Cholesterol: 219 mg/dL — AB (ref 0–200)
HDL: 78 mg/dL — AB (ref 35–70)
LDL Cholesterol: 128 mg/dL
LDL/HDL RATIO: 1.6
Triglycerides: 65 mg/dL (ref 40–160)

## 2014-02-04 ENCOUNTER — Ambulatory Visit: Payer: Self-pay | Admitting: Internal Medicine

## 2014-02-04 LAB — IRON AND TIBC
IRON: 83 ug/dL (ref 65–175)
Iron Bind.Cap.(Total): 360 ug/dL (ref 250–450)
Iron Saturation: 23 %
Unbound Iron-Bind.Cap.: 277 ug/dL

## 2014-02-04 LAB — CBC CANCER CENTER
Basophil #: 0.1 x10 3/mm (ref 0.0–0.1)
Basophil %: 1.2 %
EOS ABS: 0.5 x10 3/mm (ref 0.0–0.7)
Eosinophil %: 5.6 %
HCT: 43.6 % (ref 40.0–52.0)
HGB: 14.1 g/dL (ref 13.0–18.0)
LYMPHS PCT: 20.4 %
Lymphocyte #: 1.8 x10 3/mm (ref 1.0–3.6)
MCH: 30.2 pg (ref 26.0–34.0)
MCHC: 32.3 g/dL (ref 32.0–36.0)
MCV: 93 fL (ref 80–100)
MONO ABS: 0.4 x10 3/mm (ref 0.2–1.0)
MONOS PCT: 4.8 %
Neutrophil #: 5.9 x10 3/mm (ref 1.4–6.5)
Neutrophil %: 68 %
PLATELETS: 377 x10 3/mm (ref 150–440)
RBC: 4.67 10*6/uL (ref 4.40–5.90)
RDW: 13.9 % (ref 11.5–14.5)
WBC: 8.7 x10 3/mm (ref 3.8–10.6)

## 2014-02-04 LAB — HEPATIC FUNCTION PANEL A (ARMC)
ALK PHOS: 51 U/L
AST: 22 U/L (ref 15–37)
Albumin: 3.7 g/dL (ref 3.4–5.0)
BILIRUBIN TOTAL: 0.6 mg/dL (ref 0.2–1.0)
Bilirubin, Direct: 0.1 mg/dL (ref 0.00–0.20)
SGPT (ALT): 21 U/L (ref 12–78)
Total Protein: 6.9 g/dL (ref 6.4–8.2)

## 2014-02-04 LAB — CREATININE, SERUM
CREATININE: 1.03 mg/dL (ref 0.60–1.30)
EGFR (African American): >60
EGFR (Non-African Amer.): >60

## 2014-02-15 ENCOUNTER — Ambulatory Visit: Payer: Self-pay | Admitting: Internal Medicine

## 2014-04-25 ENCOUNTER — Ambulatory Visit: Payer: Self-pay | Admitting: Internal Medicine

## 2014-04-25 LAB — CBC CANCER CENTER
Basophil #: 0.1 x10 3/mm (ref 0.0–0.1)
Basophil %: 0.6 %
EOS ABS: 0.6 x10 3/mm (ref 0.0–0.7)
Eosinophil %: 6.1 %
HCT: 43.2 % (ref 40.0–52.0)
HGB: 14.1 g/dL (ref 13.0–18.0)
LYMPHS PCT: 21.1 %
Lymphocyte #: 2.1 x10 3/mm (ref 1.0–3.6)
MCH: 30.4 pg (ref 26.0–34.0)
MCHC: 32.5 g/dL (ref 32.0–36.0)
MCV: 93 fL (ref 80–100)
MONO ABS: 0.7 x10 3/mm (ref 0.2–1.0)
MONOS PCT: 7 %
NEUTROS ABS: 6.3 x10 3/mm (ref 1.4–6.5)
Neutrophil %: 65.2 %
PLATELETS: 432 x10 3/mm (ref 150–440)
RBC: 4.63 10*6/uL (ref 4.40–5.90)
RDW: 13.8 % (ref 11.5–14.5)
WBC: 9.7 x10 3/mm (ref 3.8–10.6)

## 2014-04-25 LAB — HEPATIC FUNCTION PANEL A (ARMC)
Albumin: 3.8 g/dL (ref 3.4–5.0)
Alkaline Phosphatase: 44 U/L — ABNORMAL LOW
BILIRUBIN DIRECT: 0.1 mg/dL (ref 0.00–0.20)
BILIRUBIN TOTAL: 0.6 mg/dL (ref 0.2–1.0)
SGOT(AST): 22 U/L (ref 15–37)
SGPT (ALT): 26 U/L
TOTAL PROTEIN: 6.7 g/dL (ref 6.4–8.2)

## 2014-04-25 LAB — CREATININE, SERUM: CREATININE: 1.1 mg/dL (ref 0.60–1.30)

## 2014-05-18 ENCOUNTER — Ambulatory Visit: Payer: Self-pay | Admitting: Internal Medicine

## 2014-06-06 ENCOUNTER — Ambulatory Visit: Payer: Self-pay | Admitting: Gastroenterology

## 2014-07-18 HISTORY — PX: COLONOSCOPY: SHX174

## 2014-12-03 ENCOUNTER — Other Ambulatory Visit: Payer: Self-pay | Admitting: *Deleted

## 2014-12-03 DIAGNOSIS — D473 Essential (hemorrhagic) thrombocythemia: Secondary | ICD-10-CM

## 2014-12-05 ENCOUNTER — Inpatient Hospital Stay: Payer: Managed Care, Other (non HMO) | Attending: Internal Medicine

## 2014-12-05 ENCOUNTER — Encounter (INDEPENDENT_AMBULATORY_CARE_PROVIDER_SITE_OTHER): Payer: Self-pay

## 2014-12-05 ENCOUNTER — Telehealth: Payer: Self-pay | Admitting: *Deleted

## 2014-12-05 DIAGNOSIS — D473 Essential (hemorrhagic) thrombocythemia: Secondary | ICD-10-CM | POA: Diagnosis present

## 2014-12-05 LAB — CBC WITH DIFFERENTIAL/PLATELET
Basophils Absolute: 0.2 10*3/uL — ABNORMAL HIGH (ref 0–0.1)
Basophils Relative: 2 %
EOS ABS: 0.5 10*3/uL (ref 0–0.7)
Eosinophils Relative: 6 %
HEMATOCRIT: 41.4 % (ref 40.0–52.0)
HEMOGLOBIN: 13.7 g/dL (ref 13.0–18.0)
LYMPHS ABS: 1.6 10*3/uL (ref 1.0–3.6)
Lymphocytes Relative: 21 %
MCH: 30 pg (ref 26.0–34.0)
MCHC: 33 g/dL (ref 32.0–36.0)
MCV: 90.7 fL (ref 80.0–100.0)
Monocytes Absolute: 0.7 10*3/uL (ref 0.2–1.0)
Monocytes Relative: 9 %
Neutro Abs: 4.6 10*3/uL (ref 1.4–6.5)
Neutrophils Relative %: 62 %
Platelets: 402 10*3/uL (ref 150–440)
RBC: 4.56 MIL/uL (ref 4.40–5.90)
RDW: 13.5 % (ref 11.5–14.5)
WBC: 7.5 10*3/uL (ref 3.8–10.6)

## 2014-12-05 LAB — HEPATIC FUNCTION PANEL
ALT: 18 U/L (ref 17–63)
AST: 28 U/L (ref 15–41)
Albumin: 4.2 g/dL (ref 3.5–5.0)
Alkaline Phosphatase: 37 U/L — ABNORMAL LOW (ref 38–126)
BILIRUBIN INDIRECT: 0.7 mg/dL (ref 0.3–0.9)
Bilirubin, Direct: 0.1 mg/dL (ref 0.1–0.5)
TOTAL PROTEIN: 6.8 g/dL (ref 6.5–8.1)
Total Bilirubin: 0.8 mg/dL (ref 0.3–1.2)

## 2014-12-05 LAB — IRON AND TIBC
IRON: 118 ug/dL (ref 45–182)
SATURATION RATIOS: 33 % (ref 17.9–39.5)
TIBC: 359 ug/dL (ref 250–450)
UIBC: 241 ug/dL

## 2014-12-05 LAB — CREATININE, SERUM: Creatinine, Ser: 1.03 mg/dL (ref 0.61–1.24)

## 2014-12-05 NOTE — Telephone Encounter (Signed)
Left message on voicemail that labs nml and no change in med

## 2015-01-01 ENCOUNTER — Other Ambulatory Visit: Payer: Self-pay | Admitting: *Deleted

## 2015-01-02 MED ORDER — ANAGRELIDE HCL 0.5 MG PO CAPS: ORAL_CAPSULE | ORAL | Status: DC

## 2015-01-02 NOTE — Telephone Encounter (Signed)
Escribed

## 2015-01-05 ENCOUNTER — Telehealth: Payer: Self-pay | Admitting: *Deleted

## 2015-01-05 ENCOUNTER — Other Ambulatory Visit: Payer: Self-pay | Admitting: Internal Medicine

## 2015-01-05 DIAGNOSIS — D473 Essential (hemorrhagic) thrombocythemia: Secondary | ICD-10-CM

## 2015-01-05 MED ORDER — ANAGRELIDE HCL 0.5 MG PO CAPS: ORAL_CAPSULE | ORAL | Status: DC

## 2015-01-05 NOTE — Telephone Encounter (Signed)
Pt.notified

## 2015-01-05 NOTE — Telephone Encounter (Signed)
Have sent Escript for 90-day supply with 3 refills.

## 2015-03-27 ENCOUNTER — Inpatient Hospital Stay: Payer: BLUE CROSS/BLUE SHIELD | Attending: Internal Medicine

## 2015-03-27 ENCOUNTER — Inpatient Hospital Stay (HOSPITAL_BASED_OUTPATIENT_CLINIC_OR_DEPARTMENT_OTHER): Payer: BLUE CROSS/BLUE SHIELD | Admitting: Internal Medicine

## 2015-03-27 VITALS — BP 135/82 | HR 58 | Temp 97.3°F | Resp 18 | Ht 70.0 in | Wt 178.1 lb

## 2015-03-27 DIAGNOSIS — Z7982 Long term (current) use of aspirin: Secondary | ICD-10-CM | POA: Diagnosis not present

## 2015-03-27 DIAGNOSIS — D473 Essential (hemorrhagic) thrombocythemia: Secondary | ICD-10-CM | POA: Diagnosis present

## 2015-03-27 DIAGNOSIS — M129 Arthropathy, unspecified: Secondary | ICD-10-CM | POA: Insufficient documentation

## 2015-03-27 DIAGNOSIS — Z79899 Other long term (current) drug therapy: Secondary | ICD-10-CM | POA: Insufficient documentation

## 2015-03-27 LAB — CREATININE, SERUM
CREATININE: 1.05 mg/dL (ref 0.61–1.24)
GFR calc Af Amer: >60 mL/min (ref >60)

## 2015-03-27 LAB — HEPATIC FUNCTION PANEL
ALT: 21 U/L (ref 17–63)
AST: 30 U/L (ref 15–41)
Albumin: 4.2 g/dL (ref 3.5–5.0)
Alkaline Phosphatase: 41 U/L (ref 38–126)
BILIRUBIN TOTAL: 0.8 mg/dL (ref 0.3–1.2)
Bilirubin, Direct: <0.1 mg/dL — ABNORMAL LOW (ref 0.1–0.5)
Total Protein: 7 g/dL (ref 6.5–8.1)

## 2015-03-27 LAB — CBC
HCT: 43.1 % (ref 40.0–52.0)
Hemoglobin: 14.3 g/dL (ref 13.0–18.0)
MCH: 30.4 pg (ref 26.0–34.0)
MCHC: 33.2 g/dL (ref 32.0–36.0)
MCV: 91.6 fL (ref 80.0–100.0)
PLATELETS: 453 10*3/uL — AB (ref 150–440)
RBC: 4.7 MIL/uL (ref 4.40–5.90)
RDW: 13.7 % (ref 11.5–14.5)
WBC: 8.9 10*3/uL (ref 3.8–10.6)

## 2015-03-27 NOTE — Progress Notes (Signed)
Patient is here for follow-up. He states that he has been doing well and offers no complaints today.  

## 2015-03-30 ENCOUNTER — Telehealth: Payer: Self-pay | Admitting: *Deleted

## 2015-03-30 DIAGNOSIS — D473 Essential (hemorrhagic) thrombocythemia: Secondary | ICD-10-CM

## 2015-03-30 MED ORDER — ANAGRELIDE HCL 0.5 MG PO CAPS: ORAL_CAPSULE | ORAL | Status: DC

## 2015-03-30 NOTE — Telephone Encounter (Signed)
Now able to get med from local pharmacy adn is requesting refill be sent to [facility]

## 2015-04-12 NOTE — Progress Notes (Signed)
[facility]  [phone] [phone]     ID: Anthony Graves OB: [birth year]  MR#: [id number]  VQM#:[id number]  Patient Care Team: Jerrol Banana., MD as PCP - General (Family Medicine)  CHIEF COMPLAINT/DIAGNOSIS:  Thrombocytosis - Essential thrombocythemia.  Asymptomatic. On Anagrelide therapy.   Bone marrow study on December 24, 2003 showed increased enlarged megakaryocytes, cellularity 50%, consistent with ET.  Cytogenetics, flow study and BCR/ABL study negative. January 2007: [id number] mutation negative    HISTORY OF PRESENT ILLNESS:  Patient returns for hematology followup, he was seen in Oct 2015. States that he continues to do well.  Denies any acute sickness or any episodes of deep vein thrombosis (DVT), pulmonary embolism, transient ischemic attack (TIA), stroke or angina since last visit here.  Platelet count has remained under good control, he is on Anagrelide. Denies any recurrent dizziness or headaches. Denies any bleeding symptoms.  Appetite and weight are steady.  States he remains physically active and ambulatory.  REVIEW OF SYSTEMS:   ROS As in HPI above. In addition, no fevers or sweats. No new headaches or focal weakness.  No sore throat, cough, shortness of breath, sputum, hemoptysis or chest pain. No dizziness or palpitation. No abdominal pain, constipation, diarrhea, dysuria or hematuria. No new skin rash or bleeding symptoms. No new paresthesias in extremities.   PAST MEDICAL HISTORY: Reviewed.        ET   Arthritis in left knee.   Arthroscopic knee surgery in 1998.  PAST SURGICAL HISTORY: Reviewed. As above.  FAMILY HISTORY: Reviewed. Negative for malignancy, hematologic disorders, diabetes or  hypertension.  SOCIAL HISTORY: Reviewed. Nonsmoker.  Moderate social alcohol. No recreational drug use.  Allergies  Allergen Reactions  . Penicillins     Other reaction(s): Unknown Reaction as a child    Current Outpatient  Prescriptions  Medication Sig Dispense Refill  . aspirin EC 81 MG tablet Take by mouth.    . celecoxib (CELEBREX) 200 MG capsule Take by mouth.    . anagrelide (AGRYLIN) 0.5 MG capsule 2 cap(s) orally in am and 1 cap orally in pm 270 capsule 2   No current facility-administered medications for this visit.    PHYSICAL EXAM: Filed Vitals:   03/27/15 1019  BP: 135/82  Pulse: 58  Temp: 97.3 F (36.3 C)  Resp: 18     Body mass index is 25.56 kg/(m^2).      GENERAL: Patient is alert and oriented and in no acute distress. No icterus. HEENT: EOMs intact. No cervical lymphadenopathy. CVS: S1S2, regular LUNGS: Bilaterally clear to auscultation, no rhonchi. ABDOMEN: Soft, nontender. No hepatosplenomegaly clinically.  NEURO: grossly nonfocal, cranial nerves are intact. Gait unremarkable. EXTREMITIES: no edema.   LAB RESULTS:    Component Value Date/Time   CREATININE 1.05 03/27/2015 1004   CREATININE 1.10 04/25/2014 0823   PROT 7.0 03/27/2015 1004   PROT 6.7 04/25/2014 0823   ALBUMIN 4.2 03/27/2015 1004   ALBUMIN 3.8 04/25/2014 0823   AST 30 03/27/2015 1004   AST 22 04/25/2014 0823   ALT 21 03/27/2015 1004   ALT 26 04/25/2014 0823   ALKPHOS 41 03/27/2015 1004   ALKPHOS 44* 04/25/2014 0823   BILITOT 0.8 03/27/2015 1004   BILITOT 0.6 04/25/2014 0823   GFRNONAA >60 03/27/2015 1004   GFRNONAA >60 04/25/2014 0823   GFRNONAA >60 02/04/2014 0830   GFRAA >60 03/27/2015 1004   GFRAA >60 04/25/2014 0823   GFRAA >60 02/04/2014 0830    Lab Results  Component Value Date   WBC 8.9 03/27/2015   NEUTROABS 4.6 12/05/2014   HGB 14.3 03/27/2015   HCT 43.1 03/27/2015   MCV 91.6 03/27/2015   PLT 453* 03/27/2015    Lab Results  Component Value Date   IRON 118 12/05/2014     ASSESSMENT / PLAN:   Essential thrombocythemia - patient continues to do well clinically without any new symptoms. No h/o episodes of thromboembolic phenomena or any bleeding symptoms. Platelet count remains  under good control at 453K today. Plan is to continue current dose of Anagrelide at 1 mg every morning and 0.5 mg every afternoon.  He was also advised to continue aspirin 81 mg daily. He is planning for hip surgery in Dec, will therefore check blood counts 1-2 weeks before this, then lab at 30 weeks, next MD f/u at about 46 weeks with CBC. Will also monitor creatinine and LFT intermittently.   In between visits, he was advised to call or come to the ER in case of fevers, bleeding, acute sickness or new symptoms. Patient is agreeable to this plan.     Leia Alf, MD   04/12/2015 10:41 PM

## 2015-07-02 ENCOUNTER — Telehealth: Payer: Self-pay | Admitting: Family Medicine

## 2015-07-03 ENCOUNTER — Encounter: Payer: Self-pay | Admitting: Emergency Medicine

## 2015-07-03 ENCOUNTER — Inpatient Hospital Stay: Payer: BLUE CROSS/BLUE SHIELD | Attending: Internal Medicine

## 2015-07-03 ENCOUNTER — Emergency Department
Admission: EM | Admit: 2015-07-03 | Discharge: 2015-07-03 | Disposition: A | Discharge status: Home / Self Care | Payer: BLUE CROSS/BLUE SHIELD | Attending: Emergency Medicine | Admitting: Emergency Medicine

## 2015-07-03 DIAGNOSIS — Z79899 Other long term (current) drug therapy: Secondary | ICD-10-CM | POA: Diagnosis not present

## 2015-07-03 DIAGNOSIS — R110 Nausea: Secondary | ICD-10-CM | POA: Diagnosis not present

## 2015-07-03 DIAGNOSIS — R040 Epistaxis: Secondary | ICD-10-CM | POA: Insufficient documentation

## 2015-07-03 DIAGNOSIS — Z7982 Long term (current) use of aspirin: Secondary | ICD-10-CM | POA: Diagnosis not present

## 2015-07-03 DIAGNOSIS — Z880 Allergy status to penicillin: Secondary | ICD-10-CM | POA: Insufficient documentation

## 2015-07-03 HISTORY — DX: Other specified abnormal findings of blood chemistry: R79.89

## 2015-07-03 MED ORDER — OXYMETAZOLINE HCL 0.05 % NA SOLN
1.0000 | Freq: Once | NASAL | Status: AC
  Administered 2015-07-03: 1 via NASAL

## 2015-07-03 MED ORDER — TRANEXAMIC ACID 1000 MG/10ML IV SOLN
500.0000 mg | Freq: Once | INTRAVENOUS | Status: AC
  Administered 2015-07-03: 500 mg via TOPICAL
  Filled 2015-07-03: qty 10

## 2015-07-03 MED ORDER — OXYMETAZOLINE HCL 0.05 % NA SOLN
NASAL | Status: AC
  Administered 2015-07-03: 1 via NASAL
  Filled 2015-07-03: qty 15

## 2015-07-03 NOTE — ED Notes (Signed)
Dr Kinner at bedside. 

## 2015-07-03 NOTE — ED Notes (Signed)
Pt states his nose started bleeding this am, Mqueen stopped the bleeding once today on left side, now right side is bleeding. Pt states he is swallowing clots of blood.

## 2015-07-03 NOTE — ED Notes (Signed)
Per Dr. Corky Downs, Pt is walking up and down hallway. If pt feels okay, discharge will be re-instated and pt will be able to go home.

## 2015-07-03 NOTE — Discharge Instructions (Signed)

## 2015-07-03 NOTE — ED Notes (Signed)
Pt left nare began bleeding around packing. Dr. Corky Downs notified. Pt will be watched for further bleeding, and discharge will be held off for now.

## 2015-07-03 NOTE — ED Provider Notes (Addendum)
[facility] Emergency Department Provider Note  ____________________________________________  Time seen: 8 PM  I have reviewed the triage vital signs and the nursing notes.   HISTORY  Chief Complaint Epistaxis    HPI Anthony Graves is a 54 y.o. male who presents with nosebleed. Reportedly he had a nosebleed this morning and saw Dr. Tami Ribas ENT who packed his left nare after cauterizing. He had been doing well but reports the bleeding started abruptly again while watching TV he felt a running down the back of his throat. He reports the bleeding stopped while waiting in the waiting room. Overall he feels well. He does not have a history of high blood pressure.     Past Medical History  Diagnosis Date  . High platelet count (HCC)     There are no active problems to display for this patient.   Past Surgical History  Procedure Laterality Date  . Knee surgery      Current Outpatient Rx  Name  Route  Sig  Dispense  Refill  . anagrelide (AGRYLIN) 0.5 MG capsule      2 cap(s) orally in am and 1 cap orally in pm   270 capsule   2   . aspirin EC 81 MG tablet   Oral   Take by mouth.         . celecoxib (CELEBREX) 200 MG capsule   Oral   Take by mouth.           Allergies Penicillins  No family history on file.  Social History Social History  Substance Use Topics  . Smoking status: Never Smoker   . Smokeless tobacco: None  . Alcohol Use: Yes     Comment: occas.     Review of Systems  Constitutional: Negative for fever. Eyes: Negative for visual changes. ENT: Positive for nosebleed Cardiovascular: Negative for chest pain. Respiratory: Negative for shortness of breath. Gastrointestinal: Positive for nausea   Skin: Negative for pallor Neurological: No dizziness Psychiatric: No anxiety    ____________________________________________   PHYSICAL EXAM:  VITAL SIGNS: ED Triage Vitals  Enc Vitals Group     BP 07/03/15 1900  159/68 mmHg     Pulse Rate 07/03/15 1900 87     Resp 07/03/15 1900 18     Temp --      Temp src --      SpO2 07/03/15 1900 100 %     Weight 07/03/15 1853 170 lb (77.111 kg)     Height 07/03/15 1853 5\' 10"  (1.778 m)     Head Cir --      Peak Flow --      Pain Score 07/03/15 2001 0     Pain Loc --      Pain Edu? --      Excl. in [place]? --      Constitutional: Alert and oriented. Well appearing and in no distress. Eyes: Conjunctivae are normal.  ENT   Head: Normocephalic and atraumatic.   Mouth/Throat: Mucous membranes are moist.  nose: Dried blood in the right nose, no active bleeding,Merocell in the left nose, no active bleeding Cardiovascular: Normal rate, regular rhythm.  Respiratory: Normal respiratory effort without tachypnea nor retractions.   Genitourinary: deferred Musculoskeletal: Nontender with normal range of motion in all extremities.  Neurologic:  Normal speech and language. No gross focal neurologic deficits are appreciated. Skin:  Skin is warm, dry and intact. No rash noted. Psychiatric: Mood and affect are normal. Patient exhibits appropriate  insight and judgment.  ____________________________________________    LABS (pertinent positives/negatives)  Labs Reviewed - No data to display  ____________________________________________   EKG  None  ____________________________________________    RADIOLOGY I have personally reviewed any xrays that were ordered on this patient: None  ____________________________________________   PROCEDURES  Procedure(s) performed: none  Critical Care performed: none  ____________________________________________   INITIAL IMPRESSION / ASSESSMENT AND PLAN / ED COURSE  Pertinent labs & imaging results that were available during my care of the patient were reviewed by me and considered in my medical decision making (see chart for details).  Patient's epistaxis improved in the waiting room. He is not actively  bleeding.  I discussed the case with ENT Dr. Richardson Landry who is on call. He states no indication to do additional packing at this time.  I gave the patient Afrin and discharged him after having him walk down the hallway without any discomfort or rebleeding. However just before leaving he started to have bleeding in the left nares again around the Merocel.  Nasal clamp applied after Afrin sprayed onto Merocel.  ----------------------------------------- 9:33 PM on 07/03/2015 -----------------------------------------  Patient continuing to have bleeding in the left nose. We will use TXA.  ----------------------------------------- 10:08 PM on 07/03/2015 -----------------------------------------  Bleeding is controlled patient is ambulating eating and drinking without difficulty.  Okay for discharge  ____________________________________________   FINAL CLINICAL IMPRESSION(S) / ED DIAGNOSES  Final diagnoses:  Anterior epistaxis     Lavonia Drafts, MD 07/03/15 2113  Lavonia Drafts, MD 07/03/15 2209

## 2015-07-30 DIAGNOSIS — D473 Essential (hemorrhagic) thrombocythemia: Secondary | ICD-10-CM | POA: Insufficient documentation

## 2015-07-30 DIAGNOSIS — Z872 Personal history of diseases of the skin and subcutaneous tissue: Secondary | ICD-10-CM | POA: Insufficient documentation

## 2015-07-30 DIAGNOSIS — M5412 Radiculopathy, cervical region: Secondary | ICD-10-CM | POA: Insufficient documentation

## 2015-09-16 ENCOUNTER — Ambulatory Visit: Payer: Self-pay | Admitting: Family Medicine

## 2015-10-23 ENCOUNTER — Telehealth: Payer: Self-pay | Admitting: *Deleted

## 2015-10-23 ENCOUNTER — Inpatient Hospital Stay: Payer: BLUE CROSS/BLUE SHIELD | Attending: Internal Medicine

## 2015-10-23 DIAGNOSIS — D751 Secondary polycythemia: Secondary | ICD-10-CM | POA: Insufficient documentation

## 2015-10-23 DIAGNOSIS — D473 Essential (hemorrhagic) thrombocythemia: Secondary | ICD-10-CM

## 2015-10-23 LAB — CREATININE, SERUM
CREATININE: 0.95 mg/dL (ref 0.61–1.24)
GFR calc non Af Amer: >60 mL/min (ref >60)

## 2015-10-23 LAB — CBC WITH DIFFERENTIAL/PLATELET
BASOS ABS: 0.2 10*3/uL — AB (ref 0–0.1)
Basophils Relative: 2 %
EOS ABS: 0.5 10*3/uL (ref 0–0.7)
EOS PCT: 4 %
HCT: 40.6 % (ref 40.0–52.0)
Hemoglobin: 13.6 g/dL (ref 13.0–18.0)
LYMPHS PCT: 17 %
Lymphs Abs: 1.9 10*3/uL (ref 1.0–3.6)
MCH: 30.2 pg (ref 26.0–34.0)
MCHC: 33.6 g/dL (ref 32.0–36.0)
MCV: 89.8 fL (ref 80.0–100.0)
Monocytes Absolute: 0.7 10*3/uL (ref 0.2–1.0)
Monocytes Relative: 7 %
NEUTROS PCT: 70 %
Neutro Abs: 7.5 10*3/uL — ABNORMAL HIGH (ref 1.4–6.5)
PLATELETS: 515 10*3/uL — AB (ref 150–440)
RBC: 4.52 MIL/uL (ref 4.40–5.90)
RDW: 13.8 % (ref 11.5–14.5)
WBC: 10.8 10*3/uL — AB (ref 3.8–10.6)

## 2015-10-23 LAB — HEPATIC FUNCTION PANEL
ALT: 19 U/L (ref 17–63)
AST: 25 U/L (ref 15–41)
Albumin: 4.1 g/dL (ref 3.5–5.0)
Alkaline Phosphatase: 42 U/L (ref 38–126)
BILIRUBIN INDIRECT: 0.8 mg/dL (ref 0.3–0.9)
Bilirubin, Direct: 0.1 mg/dL (ref 0.1–0.5)
TOTAL PROTEIN: 6.9 g/dL (ref 6.5–8.1)
Total Bilirubin: 0.9 mg/dL (ref 0.3–1.2)

## 2015-10-23 NOTE — Telephone Encounter (Signed)
Called for results of labs from this morning. I showed results to Dr Rogue Bussing who said to check with patient to see if he is taking his Agrylin 1 mg in AM 0.5mg  in PM and that he is taking his 81 mg ASA. He is to repeat labs in 1 month and to see him same day, will not change meds at this time, but will consider changing at appt in 1 month if not improved in lab values. Patient reports that he is taking his med correctly and that he agrees to appt on 5/10

## 2015-10-26 ENCOUNTER — Inpatient Hospital Stay: Admit: 2015-10-26 | Payer: BLUE CROSS/BLUE SHIELD | Admitting: Orthopedic Surgery

## 2015-10-26 SURGERY — ARTHROPLASTY, KNEE, TOTAL
Anesthesia: Choice | Site: Knee | Laterality: Right

## 2015-11-03 ENCOUNTER — Ambulatory Visit (INDEPENDENT_AMBULATORY_CARE_PROVIDER_SITE_OTHER): Payer: BLUE CROSS/BLUE SHIELD | Admitting: Family Medicine

## 2015-11-03 ENCOUNTER — Encounter: Payer: Self-pay | Admitting: Family Medicine

## 2015-11-03 VITALS — BP 130/68 | HR 75 | Temp 98.1°F | Resp 16 | Wt 179.6 lb

## 2015-11-03 DIAGNOSIS — J0100 Acute maxillary sinusitis, unspecified: Secondary | ICD-10-CM

## 2015-11-03 MED ORDER — AZITHROMYCIN 250 MG PO TABS: ORAL_TABLET | ORAL | Status: DC

## 2015-11-03 NOTE — Patient Instructions (Signed)

## 2015-11-03 NOTE — Progress Notes (Signed)
Patient ID: Anthony Graves, male   DOB: 09-10-1960, 55 y.o.   MRN: [id number]   Patient: Anthony Graves Male    DOB: 1961/07/09   55 y.o.   MRN: [id number] Visit Date: 11/03/2015  Today's Provider: Vernie Murders, PA   Chief Complaint  Patient presents with  . URI   Subjective:    URI  This is a recurrent (had a heavy cold/"flu" 2 weeks ago.) problem. Episode onset: onset 2 days ago. The problem has been unchanged. There has been no fever. Associated symptoms include congestion, coughing and sneezing. Pertinent negatives include no ear pain or sore throat. Treatments tried: Delsym. The treatment provided mild relief.    Past Medical History  Diagnosis Date  . High platelet count [facility])    Patient Active Problem List   Diagnosis Date Noted  . 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 Personal history of diseases of skin or subcutaneous tissue 07/30/2015  . Elevated platelet count ([place]) 07/30/2015   Past Surgical History  Procedure Laterality Date  . Knee surgery    . Cystectomy      pilonidal cyst removed   History reviewed. No pertinent family history.  Previous Medications   ANAGRELIDE (AGRYLIN) 0.5 MG CAPSULE    2 cap(s) orally in am and 1 cap orally in pm   ASPIRIN EC 81 MG TABLET    Take by mouth.   CELECOXIB (CELEBREX) 200 MG CAPSULE    Take by mouth.   Allergies  Allergen Reactions  . Penicillins     Other reaction(s): Unknown Reaction as a child    Review of Systems  Constitutional: Negative.   HENT: Positive for congestion and sneezing. Negative for ear pain and sore throat.   Eyes: Negative.   Respiratory: Positive for cough.   Cardiovascular: Negative.   Gastrointestinal: Negative.   Endocrine: Negative.   Genitourinary: Negative.   Musculoskeletal: Negative.   Skin: Negative.   Allergic/Immunologic: Negative.   Neurological: Negative.   Hematological: Negative.   Psychiatric/Behavioral: Negative.     Social History  Substance Use Topics  . Smoking  status: Never Smoker   . Smokeless tobacco: Not on file  . Alcohol Use: Yes     Comment: occas.    Objective:   BP 130/68 mmHg  Pulse 75  Temp(Src) 98.1 F (36.7 C) (Oral)  Resp 16  Wt 179 lb 9.6 oz (81.466 kg)  Physical Exam  Constitutional: He is oriented to person, place, and time. He appears well-developed and well-nourished.  HENT:  Head: Normocephalic.  Right Ear: External ear normal.  Left Ear: External ear normal.  Mouth/Throat: Oropharynx is clear and moist.  Slightly reddened and swollen turbinates with some bluish color to posterior membranes on the right. No transillumination of the right maxillary sinus without significant tenderness.  Eyes: Conjunctivae and EOM are normal.  Neck: Neck supple.  Cardiovascular: Normal rate and regular rhythm.   Pulmonary/Chest: Effort normal and breath sounds normal.  Abdominal: Soft. Bowel sounds are normal.  Lymphadenopathy:    He has no cervical adenopathy.  Neurological: He is alert and oriented to person, place, and time.  Skin: No rash noted.  Psychiatric: He has a normal mood and affect. His behavior is normal.      Assessment & Plan:     1. Acute maxillary sinusitis, recurrence not specified Onset over the past 2 days with some sneezing and cough. May use Flonase, Zyrtec and Delsym (or Mucinex-DM) as needed. Add antibiotic and increase fluid  intake. May use Tylenol prn aches or pains. Recheck in 7 days if no improvement.  - azithromycin (ZITHROMAX) 250 MG tablet; Take 2 tablets by mouth today then one daily for 4 days.  Dispense: 6 tablet; Refill: 0

## 2015-11-23 DIAGNOSIS — L578 Other skin changes due to chronic exposure to nonionizing radiation: Secondary | ICD-10-CM | POA: Diagnosis not present

## 2015-11-23 DIAGNOSIS — D485 Neoplasm of uncertain behavior of skin: Secondary | ICD-10-CM | POA: Diagnosis not present

## 2015-11-23 DIAGNOSIS — Z85828 Personal history of other malignant neoplasm of skin: Secondary | ICD-10-CM | POA: Diagnosis not present

## 2015-11-23 DIAGNOSIS — L820 Inflamed seborrheic keratosis: Secondary | ICD-10-CM | POA: Diagnosis not present

## 2015-11-23 DIAGNOSIS — Z1283 Encounter for screening for malignant neoplasm of skin: Secondary | ICD-10-CM | POA: Diagnosis not present

## 2015-11-25 ENCOUNTER — Inpatient Hospital Stay (HOSPITAL_BASED_OUTPATIENT_CLINIC_OR_DEPARTMENT_OTHER): Payer: BLUE CROSS/BLUE SHIELD | Admitting: Internal Medicine

## 2015-11-25 ENCOUNTER — Inpatient Hospital Stay: Payer: BLUE CROSS/BLUE SHIELD | Attending: Internal Medicine

## 2015-11-25 VITALS — BP 129/79 | HR 72 | Temp 95.6°F | Resp 20 | Wt 178.8 lb

## 2015-11-25 DIAGNOSIS — Z79899 Other long term (current) drug therapy: Secondary | ICD-10-CM | POA: Insufficient documentation

## 2015-11-25 DIAGNOSIS — R7989 Other specified abnormal findings of blood chemistry: Secondary | ICD-10-CM

## 2015-11-25 DIAGNOSIS — D473 Essential (hemorrhagic) thrombocythemia: Secondary | ICD-10-CM

## 2015-11-25 DIAGNOSIS — Z7982 Long term (current) use of aspirin: Secondary | ICD-10-CM | POA: Insufficient documentation

## 2015-11-25 LAB — CBC WITH DIFFERENTIAL/PLATELET
BASOS PCT: 1 %
Basophils Absolute: 0.1 10*3/uL (ref 0–0.1)
EOS ABS: 0.4 10*3/uL (ref 0–0.7)
EOS PCT: 4 %
HCT: 40.3 % (ref 40.0–52.0)
HEMOGLOBIN: 13.6 g/dL (ref 13.0–18.0)
Lymphocytes Relative: 17 %
Lymphs Abs: 1.8 10*3/uL (ref 1.0–3.6)
MCH: 29.9 pg (ref 26.0–34.0)
MCHC: 33.7 g/dL (ref 32.0–36.0)
MCV: 88.9 fL (ref 80.0–100.0)
MONO ABS: 0.8 10*3/uL (ref 0.2–1.0)
MONOS PCT: 8 %
NEUTROS PCT: 70 %
Neutro Abs: 7.3 10*3/uL — ABNORMAL HIGH (ref 1.4–6.5)
PLATELETS: 469 10*3/uL — AB (ref 150–440)
RBC: 4.54 MIL/uL (ref 4.40–5.90)
RDW: 14.2 % (ref 11.5–14.5)
WBC: 10.4 10*3/uL (ref 3.8–10.6)

## 2015-11-25 NOTE — Progress Notes (Signed)
Patient does not offer any problems today.  

## 2015-11-25 NOTE — Progress Notes (Signed)
[place] OFFICE PROGRESS NOTE  Patient Care Team: Jerrol Banana., MD as PCP - General (Family Medicine)   SUMMARY OF HEMATOLOGIC/ONCOLOGIC HISTORY:  # 2005- Essential thrombocythemia [Bone marrow study on December 24, 2003 showed increased enlarged megakaryocytes, cellularity 50%, consistent with ET; Cytogenetics, flow study nd BCR/ABL study negative.January 2007: [id number] mutation negative]On Anagrelide therapy Asa 81mg /d   INTERVAL HISTORY:  This is my first interaction with the patient since I joined the practice September 2016. I reviewed the patient's prior charts/pertinent labs/imaging in detail; findings are summarized above.   A very pleasant 55 year old male patient with above history of essential thrombocytosis currently on anagrelide is here for follow-up. Patient denies any unusual burning in fingertips and toes. Appetite is good. No nausea no vomiting. Denies any weight loss. Denies any chest pain or shortness of breath.  Patient is planned to have a knee replacement in the next few weeks.  REVIEW OF SYSTEMS:  A complete 10 point review of system is done which is negative except mentioned above/history of present illness.   PAST MEDICAL HISTORY :  Past Medical History  Diagnosis Date  . High platelet count ([place])     PAST SURGICAL HISTORY :   Past Surgical History  Procedure Laterality Date  . Knee surgery    . Cystectomy      pilonidal cyst removed    FAMILY HISTORY :  No family history on file.  SOCIAL HISTORY:   Social History  Substance Use Topics  . Smoking status: Never Smoker   . Smokeless tobacco: Not on file  . Alcohol Use: Yes     Comment: occas.     ALLERGIES:  is allergic to penicillins.  MEDICATIONS:  Current Outpatient Prescriptions  Medication Sig Dispense Refill  . anagrelide (AGRYLIN) 0.5 MG capsule 2 cap(s) orally in am and 1 cap orally in pm 270 capsule 2  . aspirin EC 81 MG tablet Take by mouth.    . celecoxib  (CELEBREX) 200 MG capsule Take by mouth.     No current facility-administered medications for this visit.    PHYSICAL EXAMINATION: ECOG PERFORMANCE STATUS: 0 - Asymptomatic  BP 129/79 mmHg  Pulse 72  Temp(Src) 95.6 F (35.3 C) (Tympanic)  Wt 178 lb 12.7 oz (81.1 kg)  Filed Weights   11/25/15 0828  Weight: 178 lb 12.7 oz (81.1 kg)    GENERAL: Well-nourished well-developed; Alert, no distress and comfortable.  Alone. EYES: no pallor or icterus OROPHARYNX: no thrush or ulceration; good dentition  NECK: supple, no masses felt LYMPH:  no palpable lymphadenopathy in the cervical, axillary or inguinal regions LUNGS: clear to auscultation and  No wheeze or crackles HEART/CVS: regular rate & rhythm and no murmurs; No lower extremity edema ABDOMEN:abdomen soft, non-tender and normal bowel sounds Musculoskeletal:no cyanosis of digits and no clubbing  PSYCH: alert & oriented x 3 with fluent speech NEURO: no focal motor/sensory deficits SKIN:  no rashes or significant lesions  LABORATORY DATA:  I have reviewed the data as listed    Component Value Date/Time   NA 140 01/01/2014   K 5.5* 01/01/2014   BUN 18 01/01/2014   CREATININE 0.95 10/23/2015 0918   CREATININE 1.10 04/25/2014 0823   CREATININE 1.0 01/01/2014   PROT 6.9 10/23/2015 0918   PROT 6.7 04/25/2014 0823   ALBUMIN 4.1 10/23/2015 0918   ALBUMIN 3.8 04/25/2014 0823   AST 25 10/23/2015 0918   AST 22 04/25/2014 0823   ALT 19 10/23/2015 0918  ALT 26 04/25/2014 0823   ALKPHOS 42 10/23/2015 0918   ALKPHOS 44* 04/25/2014 0823   BILITOT 0.9 10/23/2015 0918   BILITOT 0.6 04/25/2014 0823   GFRNONAA >60 10/23/2015 0918   GFRNONAA >60 04/25/2014 0823   GFRNONAA >60 02/04/2014 0830   GFRAA >60 10/23/2015 0918   GFRAA >60 04/25/2014 0823   GFRAA >60 02/04/2014 0830    No results found for: SPEP, UPEP  Lab Results  Component Value Date   WBC 10.4 11/25/2015   NEUTROABS 7.3* 11/25/2015   HGB 13.6 11/25/2015   HCT 40.3  11/25/2015   MCV 88.9 11/25/2015   PLT 469* 11/25/2015      Chemistry      Component Value Date/Time   NA 140 01/01/2014   K 5.5* 01/01/2014   BUN 18 01/01/2014   CREATININE 0.95 10/23/2015 0918   CREATININE 1.10 04/25/2014 0823   CREATININE 1.0 01/01/2014   GLU 89 01/01/2014      Component Value Date/Time   ALKPHOS 42 10/23/2015 0918   ALKPHOS 44* 04/25/2014 0823   AST 25 10/23/2015 0918   AST 22 04/25/2014 0823   ALT 19 10/23/2015 0918   ALT 26 04/25/2014 0823   BILITOT 0.9 10/23/2015 0918   BILITOT 0.6 04/25/2014 0823         ASSESSMENT & PLAN:   # Essential thrombocytosis-no complications noted. platelets today 466/stable on anagrelide 1.5 mg a day. Continue the same. Discussed regarding Hydrea; however we'll continue anagrelide at this time. and also discussed regarding checking MPL/CALR mutation Visit.  # Follow-up in 6 months CBC CMP     Cammie Sickle, MD 11/25/2015 8:56 AM

## 2015-12-27 ENCOUNTER — Ambulatory Visit: Payer: Self-pay | Admitting: Orthopedic Surgery

## 2016-01-11 ENCOUNTER — Other Ambulatory Visit: Payer: Self-pay | Admitting: *Deleted

## 2016-01-11 DIAGNOSIS — D473 Essential (hemorrhagic) thrombocythemia: Secondary | ICD-10-CM

## 2016-01-11 MED ORDER — ANAGRELIDE HCL 0.5 MG PO CAPS
ORAL_CAPSULE | ORAL | Status: DC
Start: 2016-01-11 — End: 2016-10-17

## 2016-01-26 ENCOUNTER — Encounter: Payer: Self-pay | Admitting: Family Medicine

## 2016-02-12 ENCOUNTER — Ambulatory Visit: Payer: BLUE CROSS/BLUE SHIELD | Admitting: Internal Medicine

## 2016-02-12 ENCOUNTER — Other Ambulatory Visit: Payer: BLUE CROSS/BLUE SHIELD

## 2016-02-15 ENCOUNTER — Encounter (HOSPITAL_COMMUNITY): Admission: RE | Payer: Self-pay | Source: Ambulatory Visit

## 2016-02-15 ENCOUNTER — Inpatient Hospital Stay (HOSPITAL_COMMUNITY)
Admission: RE | Admit: 2016-02-15 | Payer: BLUE CROSS/BLUE SHIELD | Source: Ambulatory Visit | Admitting: Orthopedic Surgery

## 2016-02-15 SURGERY — ARTHROPLASTY, KNEE, TOTAL
Anesthesia: Choice | Site: Knee | Laterality: Right

## 2016-05-12 ENCOUNTER — Telehealth: Payer: Self-pay | Admitting: Family Medicine

## 2016-05-24 ENCOUNTER — Ambulatory Visit: Payer: Self-pay | Admitting: Orthopedic Surgery

## 2016-05-27 ENCOUNTER — Other Ambulatory Visit: Payer: BLUE CROSS/BLUE SHIELD

## 2016-05-27 ENCOUNTER — Ambulatory Visit: Payer: Self-pay | Admitting: Internal Medicine

## 2016-05-30 ENCOUNTER — Inpatient Hospital Stay: Payer: BLUE CROSS/BLUE SHIELD | Attending: Internal Medicine | Admitting: Internal Medicine

## 2016-05-30 ENCOUNTER — Inpatient Hospital Stay: Payer: BLUE CROSS/BLUE SHIELD

## 2016-05-30 VITALS — BP 126/61 | HR 72 | Temp 96.7°F | Resp 18 | Wt 177.4 lb

## 2016-05-30 DIAGNOSIS — Z7982 Long term (current) use of aspirin: Secondary | ICD-10-CM | POA: Diagnosis not present

## 2016-05-30 DIAGNOSIS — D473 Essential (hemorrhagic) thrombocythemia: Secondary | ICD-10-CM

## 2016-05-30 DIAGNOSIS — R7989 Other specified abnormal findings of blood chemistry: Secondary | ICD-10-CM

## 2016-05-30 LAB — CBC WITH DIFFERENTIAL/PLATELET
BASOS PCT: 1 %
Basophils Absolute: 0.2 10*3/uL — ABNORMAL HIGH (ref 0–0.1)
EOS ABS: 0.4 10*3/uL (ref 0–0.7)
EOS PCT: 4 %
HCT: 41 % (ref 40.0–52.0)
HEMOGLOBIN: 13.5 g/dL (ref 13.0–18.0)
Lymphocytes Relative: 21 %
Lymphs Abs: 2.6 10*3/uL (ref 1.0–3.6)
MCH: 29.7 pg (ref 26.0–34.0)
MCHC: 33 g/dL (ref 32.0–36.0)
MCV: 90 fL (ref 80.0–100.0)
Monocytes Absolute: 0.8 10*3/uL (ref 0.2–1.0)
Monocytes Relative: 7 %
NEUTROS PCT: 67 %
Neutro Abs: 8 10*3/uL — ABNORMAL HIGH (ref 1.4–6.5)
PLATELETS: 537 10*3/uL — AB (ref 150–440)
RBC: 4.55 MIL/uL (ref 4.40–5.90)
RDW: 14.1 % (ref 11.5–14.5)
WBC: 11.9 10*3/uL — AB (ref 3.8–10.6)

## 2016-05-30 LAB — COMPREHENSIVE METABOLIC PANEL
ALBUMIN: 4.4 g/dL (ref 3.5–5.0)
ALK PHOS: 35 U/L — AB (ref 38–126)
ALT: 19 U/L (ref 17–63)
ANION GAP: 8 (ref 5–15)
AST: 27 U/L (ref 15–41)
BUN: 23 mg/dL — ABNORMAL HIGH (ref 6–20)
CALCIUM: 9.1 mg/dL (ref 8.9–10.3)
CHLORIDE: 101 mmol/L (ref 101–111)
CO2: 28 mmol/L (ref 22–32)
CREATININE: 1.02 mg/dL (ref 0.61–1.24)
GFR calc non Af Amer: >60 mL/min (ref >60)
GLUCOSE: 95 mg/dL (ref 65–99)
Potassium: 4.2 mmol/L (ref 3.5–5.1)
SODIUM: 137 mmol/L (ref 135–145)
Total Bilirubin: 0.8 mg/dL (ref 0.3–1.2)
Total Protein: 7 g/dL (ref 6.5–8.1)

## 2016-05-30 NOTE — Progress Notes (Signed)
Patient is here for follow up, he is doing well no complaints  

## 2016-05-30 NOTE — Assessment & Plan Note (Addendum)
#   Essential thrombocytosis-no complications noted. platelets today 536/stable on anagrelide 1.5 mg a day. Continue the same. Awaiting on MPL/CALR mutation ordered on todays visit.   # Prognosis/ Life insurance-  In general patients with essential thrombocytosis have similar to life expectancy.   # right knee replacement- end of jan 2018; will have labs thru pcp.  Patient will need standard  thromboebolic prophylaxis.   # Follow-up in 12 months CBC CMP.  CC: Dr.Aluesio; Dr. Rosanna Randy.

## 2016-05-30 NOTE — Progress Notes (Signed)
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  His planned to have a right knee replacement in January 2018.   Patient denies any unusual burning in fingertips and toes. Appetite is good. No nausea no vomiting. Denies any weight loss. Denies any chest pain or shortness of breath.   REVIEW OF SYSTEMS:  A complete 10 point review of system is done which is negative except mentioned above/history of present illness.   PAST MEDICAL HISTORY :  Past Medical History:  Diagnosis Date  . 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 CYSTECTOMY     pilonidal cyst removed  . KNEE SURGERY      FAMILY HISTORY :  No family history on file.  SOCIAL HISTORY:   Social History  Substance Use Topics  . Smoking status: Never Smoker  . Smokeless tobacco: Not on file  . Alcohol use Yes     Comment: occas.     ALLERGIES:  is allergic to penicillins.  MEDICATIONS:  Current Outpatient Prescriptions  Medication Sig Dispense Refill  . anagrelide (AGRYLIN) 0.5 MG capsule 2 cap(s) orally in am and 1 cap orally in pm 270 capsule 2  . aspirin EC 81 MG tablet Take 81 mg by mouth daily.     . celecoxib (CELEBREX) 200 MG capsule Take 200 mg by mouth daily.       No current facility-administered medications for this visit.     PHYSICAL EXAMINATION: ECOG PERFORMANCE STATUS: 0 - Asymptomatic  BP 126/61 (BP Location: Left Arm, Patient Position: Sitting)   Pulse 72   Temp (!) 96.7 F (35.9 C) (Tympanic)   Resp 18   Wt 177 lb 6.4 oz (80.5 kg)   BMI 25.45 kg/m   Filed Weights   05/30/16 1113  Weight: 177 lb 6.4 oz (80.5 kg)    GENERAL: Well-nourished well-developed; Alert, no distress and comfortable.  Alone. EYES: no pallor or icterus OROPHARYNX: no thrush or ulceration; good dentition  NECK: supple, no masses felt LYMPH:  no palpable lymphadenopathy in the cervical, axillary or inguinal regions LUNGS: clear to auscultation and  No wheeze or crackles HEART/CVS: regular rate & rhythm and no murmurs; No lower extremity edema ABDOMEN:abdomen soft, non-tender and normal bowel sounds Musculoskeletal:no cyanosis of digits and no clubbing  PSYCH: alert & oriented x 3 with fluent speech NEURO: no focal motor/sensory deficits SKIN:  no rashes or significant lesions  LABORATORY DATA:  I have reviewed the data as listed    Component Value Date/Time   NA 137 05/30/2016 1015   NA 140 01/01/2014   K 4.2 05/30/2016 1015   CL 101 05/30/2016 1015   CO2 28 05/30/2016 1015   GLUCOSE 95 05/30/2016 1015   BUN 23 (H) 05/30/2016 1015   BUN 18 01/01/2014  CREATININE 1.02 05/30/2016 1015   CREATININE 1.10 04/25/2014 0823   CALCIUM 9.1 05/30/2016 1015   PROT 7.0 05/30/2016 1015   PROT 6.7 04/25/2014 0823   ALBUMIN 4.4 05/30/2016 1015   ALBUMIN 3.8 04/25/2014 0823   AST 27 05/30/2016 1015   AST 22 04/25/2014 0823   ALT 19 05/30/2016 1015   ALT 26 04/25/2014 0823   ALKPHOS 35 (L) 05/30/2016 1015   ALKPHOS 44 (L) 04/25/2014 0823   BILITOT 0.8 05/30/2016 1015   BILITOT 0.6 04/25/2014 0823   GFRNONAA >60 05/30/2016 1015   GFRNONAA >60 04/25/2014 0823   GFRNONAA >60 02/04/2014 0830   GFRAA >60 05/30/2016 1015   GFRAA >60 04/25/2014 0823    GFRAA >60 02/04/2014 0830    No results found for: SPEP, UPEP  Lab Results  Component Value Date   WBC 11.9 (H) 05/30/2016   NEUTROABS 8.0 (H) 05/30/2016   HGB 13.5 05/30/2016   HCT 41.0 05/30/2016   MCV 90.0 05/30/2016   PLT 537 (H) 05/30/2016      Chemistry      Component Value Date/Time   NA 137 05/30/2016 1015   NA 140 01/01/2014   K 4.2 05/30/2016 1015   CL 101 05/30/2016 1015   CO2 28 05/30/2016 1015   BUN 23 (H) 05/30/2016 1015   BUN 18 01/01/2014   CREATININE 1.02 05/30/2016 1015   CREATININE 1.10 04/25/2014 0823   GLU 89 01/01/2014      Component Value Date/Time   CALCIUM 9.1 05/30/2016 1015   ALKPHOS 35 (L) 05/30/2016 1015   ALKPHOS 44 (L) 04/25/2014 0823   AST 27 05/30/2016 1015   AST 22 04/25/2014 0823   ALT 19 05/30/2016 1015   ALT 26 04/25/2014 0823   BILITOT 0.8 05/30/2016 1015   BILITOT 0.6 04/25/2014 0823         ASSESSMENT & PLAN:   Essential thrombocytosis ([place]) # Essential thrombocytosis-no complications noted. platelets today 536/stable on anagrelide 1.5 mg a day. Continue the same. Awaiting on MPL/CALR mutation ordered on todays visit.   # Prognosis/ Life insurance-  In general patients with essential thrombocytosis have similar to life expectancy.   # right knee replacement- end of jan 2018; will have labs thru pcp.  Patient will need standard  thromboebolic prophylaxis.   # Follow-up in 12 months CBC CMP.  CC: Dr.Aluesio; Dr. Rosanna Randy.       Cammie Sickle, MD 05/30/2016 1:49 PM

## 2016-06-06 LAB — MISC LABCORP TEST (SEND OUT): Labcorp test code: 489450

## 2016-06-07 LAB — MPL MUTATION ANALYSIS

## 2016-07-22 ENCOUNTER — Ambulatory Visit: Payer: Self-pay | Admitting: Orthopedic Surgery

## 2016-07-22 NOTE — H&P (Signed)
Anthony Graves DOB: 09-26-1960 Married / Language: English / Race: White Male Date of Admission:  08/16/2015 CC:  Right Knee Pain History of Present Illness  The patient is a 56 year old male who comes in for a preoperative History and Physical. The patient is scheduled for a right total knee arthroplasty to be performed by Dr. Dione Plover. Aluisio, MD at [facility] on 08-15-2016. The patient is a 56 year old male who presented to the practice transitioning from another physician. The patient reports right knee symptoms including: pain, swelling, stiffness and grinding which began year(s) (worsened over the past 10 years) ago.The patient feels that the symptoms are worsening. Prior to being seen the patient was previously evaluated by a colleague (Dr. Lisette Grinder in [place], is a good friend). Current treatment includes nonsteroidal anti-inflammatory drugs (Celebrex daily x 2 years). Note for "Knee pain": History of torn PCL at age 41. No history of cortisone injections. His right knee has gotten progressively worse over time. He states that it is hurting him with almost all activities now. It is really limiting what he can and cannot do. He cannot even go for long walks any more. He wants to be more active but the knee is preventing him from doing so. He has had cortisone injections in the past but no longer beneficial. He is now reached a point where he would like to get something done about the knee. They have been treated conservatively in the past for the above stated problem and despite conservative measures, they continue to have progressive pain and severe functional limitations and dysfunction. They have failed non-operative management including home exercise, medications, and injections. It is felt that they would benefit from undergoing total joint replacement. Risks and benefits of the procedure have been discussed with the patient and they elect to proceed with surgery. There are no  active contraindications to surgery such as ongoing infection or rapidly progressive neurological disease.  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 (for high platelet count) Celecoxib (200MG  Capsule, Oral) Active. Aspirin (81MG  Tablet, Oral) Active.  Past Surgical History Arthroscopy of Knee  right; 1981, 1999  Review of Systems General Not Present- Chills, Fatigue, Fever, Memory Loss, Night Sweats, Weight Gain and Weight Loss. Skin Not Present- Eczema, Hives, Itching, Lesions and Rash. HEENT Not Present- Dentures, Double Vision, Headache, Hearing Loss, Tinnitus and Visual Loss. Respiratory Not Present- Allergies, Chronic Cough, Coughing up blood, Shortness of breath at rest and Shortness of breath with exertion. Cardiovascular Not Present- Chest Pain, Difficulty Breathing Lying Down, Murmur, Palpitations, Racing/skipping heartbeats and Swelling. Gastrointestinal Not Present- Abdominal Pain, Bloody Stool, Constipation, Diarrhea, Difficulty Swallowing, Heartburn, Jaundice, Loss of appetitie, Nausea and Vomiting. Male Genitourinary Not Present- Blood in Urine, Discharge, Flank Pain, Incontinence, Painful Urination, Urgency, Urinary frequency, Urinary Retention, Urinating at Night and Weak urinary stream. Musculoskeletal Present- Joint Pain. Not Present- Back Pain, Joint Swelling, Morning Stiffness, Muscle Pain, Muscle Weakness and Spasms. Neurological Not Present- Blackout spells, Difficulty with balance, Dizziness, Paralysis, Tremor and Weakness. Psychiatric Not Present- Insomnia.  Vitals   Weight: 180 lb Height: 70in Body Surface Area: 2 m Body Mass Index:  25.83 kg/m  Pulse: 72 (Regular)  BP: 140/62 (Sitting, Right Arm, Standard)  Physical Exam  General Mental Status -Alert, cooperative and good historian. General Appearance-pleasant, Not in acute distress. Orientation-Oriented X3. Build & Nutrition-Well nourished and Well developed.  Head and Neck Head-normocephalic, atraumatic . Neck Global Assessment - supple, no bruit auscultated on the right, no bruit auscultated on the left.  Eye Vision-Wears corrective lenses(readers only). Pupil - Bilateral-Regular and Round. Motion - Bilateral-EOMI.  Chest and Lung Exam Auscultation Breath sounds - clear at anterior chest wall and clear at posterior chest wall. Adventitious sounds - No Adventitious sounds.  Cardiovascular Auscultation Rhythm - Regular rate and rhythm. Heart Sounds - S1 WNL and S2 WNL. Murmurs & Other Heart Sounds - Auscultation of the heart reveals - No Murmurs.  Abdomen Palpation/Percussion Tenderness - Abdomen is non-tender to palpation. Rigidity (guarding) - Abdomen is soft. Auscultation Auscultation of the abdomen reveals - Bowel sounds normal.  Male Genitourinary Note: Not done, not pertinent to present illness   Musculoskeletal Note: On exam, well-developed male, alert and oriented, in no apparent distress. Evaluation of his hips show normal range of motion. No discomfort. His left knee shows no effusion. Range 0 to 140 on the left, but no swelling, tenderness, or instability. Right knee: No effusion. Slight varus. Range 5 to 120. Marked crepitus on range of motion. Tenderness medial greater than lateral with no instability noted. Pulse, sensation and motor are intact, both lower extremities.  RADIOGRAPHS AP both knees and lateral show severe end-stage arthritis of that right knee, bone-on-bone throughout, with large osteophytes throughout.  Assessment & Plan   Osteoarthritis Right Knee  Note:Surgical Plans: Right Total Knee Replacement  Disposition: Home and straight to outpatient therapy [facility] outpatient therapy department on Thursday Feb 1st.  PCP: Dr. Rosanna Randy - Pending at time of H&P  IV TXA  Anesthesia Issues: None  Patient was instructed on what medications to stop prior to surgery.  Signed electronically by Ok Edwards, III PA-C

## 2016-07-26 ENCOUNTER — Encounter: Payer: Self-pay | Admitting: Family Medicine

## 2016-07-26 ENCOUNTER — Ambulatory Visit (INDEPENDENT_AMBULATORY_CARE_PROVIDER_SITE_OTHER): Payer: BLUE CROSS/BLUE SHIELD | Admitting: Family Medicine

## 2016-07-26 VITALS — BP 132/66 | HR 80 | Temp 98.5°F | Resp 16 | Wt 182.0 lb

## 2016-07-26 DIAGNOSIS — Z0000 Encounter for general adult medical examination without abnormal findings: Secondary | ICD-10-CM

## 2016-07-26 DIAGNOSIS — Z01818 Encounter for other preprocedural examination: Secondary | ICD-10-CM | POA: Diagnosis not present

## 2016-07-26 DIAGNOSIS — Z1211 Encounter for screening for malignant neoplasm of colon: Secondary | ICD-10-CM | POA: Diagnosis not present

## 2016-07-26 DIAGNOSIS — Z125 Encounter for screening for malignant neoplasm of prostate: Secondary | ICD-10-CM | POA: Diagnosis not present

## 2016-07-26 DIAGNOSIS — E041 Nontoxic single thyroid nodule: Secondary | ICD-10-CM | POA: Diagnosis not present

## 2016-07-26 DIAGNOSIS — G8929 Other chronic pain: Secondary | ICD-10-CM

## 2016-07-26 DIAGNOSIS — D473 Essential (hemorrhagic) thrombocythemia: Secondary | ICD-10-CM

## 2016-07-26 DIAGNOSIS — M25561 Pain in right knee: Secondary | ICD-10-CM | POA: Diagnosis not present

## 2016-07-26 LAB — IFOBT (OCCULT BLOOD): IMMUNOLOGICAL FECAL OCCULT BLOOD TEST: NEGATIVE

## 2016-07-26 NOTE — Progress Notes (Signed)
Anthony Graves  MRN: [id number] DOB: Jul 02, 1961  Subjective:  HPI Here for CPE which has not been done in several years. Patient is here for surgical clearance. Surgery scheduled for right total knee replacement on August 15, 2016 by Dr. Maureen Ralphs at [facility]. Patient does not have any concerns as far as knee surgery and what this means but does states he has had high platelet count for the past 15 years and wanted to discuss this. He is taking Aspirin 81 mg. Does not smoke. Social alcohol use. No past reactions to the anesthesia.  Patient Active Problem List   Diagnosis Date Noted  . 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 Personal history of diseases of skin or subcutaneous tissue 07/30/2015  . Essential thrombocytosis ([place]) 07/30/2015    Past Medical History:  Diagnosis Date  . High platelet count (HCC)     Social History   Social History  . Marital status: Married    Spouse name: N/A  . Number of children: N/A  . Years of education: N/A   Occupational History  . Not on file.   Social History Main Topics  . Smoking status: Never Smoker  . Smokeless tobacco: Not on file  . Alcohol use Yes     Comment: occas.   . Drug use: Unknown  . Sexual activity: Not on file   Other Topics Concern  . Not on file   Social History Narrative  . No narrative on file    Outpatient Encounter Prescriptions as of 07/26/2016  Medication Sig Note  . anagrelide (AGRYLIN) 0.5 MG capsule 2 cap(s) orally in am and 1 cap orally in pm   . aspirin EC 81 MG tablet Take 81 mg by mouth daily.  03/27/2015: Received from: [facility]  . celecoxib (CELEBREX) 200 MG capsule Take 200 mg by mouth daily.  03/27/2015: Received from: [facility]   No facility-administered encounter medications on file as of 07/26/2016.     Allergies  Allergen Reactions  . Penicillins     Other reaction(s): Unknown Reaction as a child    Review of Systems    Constitutional: Negative.   HENT: Negative.   Eyes: Negative.   Respiratory: Negative.   Cardiovascular: Negative.   Gastrointestinal: Negative.   Musculoskeletal: Positive for joint pain.  Skin: Negative.   Neurological: Negative.   Endo/Heme/Allergies: Negative.        Known thrombocytosis which is being treated by hematology. Normal platelet counts between 500,000 and 600,000  Psychiatric/Behavioral: Negative.     Objective:  BP 132/66   Pulse 80   Temp 98.5 F (36.9 C)   Resp 16   Wt 182 lb (82.6 kg)   BMI 26.11 kg/m   Physical Exam  Constitutional: He is oriented to person, place, and time and well-developed, well-nourished, and in no distress.  HENT:  Head: Normocephalic and atraumatic.  Right Ear: External ear normal.  Left Ear: External ear normal.  Nose: Nose normal.  Mouth/Throat: Oropharynx is clear and moist.  Eyes: Conjunctivae are normal. Pupils are equal, round, and reactive to light.  Neck: Normal range of motion. Neck supple. Thyromegaly present.  Thyroid nodule, appears to be at the isthmus.  Cardiovascular: Normal rate, regular rhythm, normal heart sounds and intact distal pulses.   No murmur heard. Pulmonary/Chest: Effort normal and breath sounds normal. No respiratory distress. He has no wheezes.  Abdominal: Soft. He exhibits no distension. There is no tenderness.  Genitourinary: Rectum  normal, prostate normal and penis normal. Rectal exam shows guaiac negative stool. No discharge found.  Musculoskeletal: He exhibits no edema or tenderness.  Neurological: He is alert and oriented to person, place, and time. Gait normal.  Skin: No rash noted. No erythema.  Psychiatric: Mood, memory, affect and judgment normal.    Assessment and Plan :  1. Pre-op exam EKG stable. Patient is cleared for surgery. - EKG 12-Lead  2. Annual physical exam - Comprehensive metabolic panel - Lipid Panel With LDL/HDL Ratio - TSH Up-to-date on colonoscopy. 3. Chronic  pain of right knee Having surgery 08/15/16 for this issue  4. Essential thrombocytosis (HCC) - CBC w/Diff/Platelet  5. Colon cancer screening - IFOBT POC (occult bld, rslt in office)  6. Prostate cancer screening - PSA  7. Thyroid nodule Found on the exam today, refer to Dr Maretta Bees. Considered ultrasound. Go ahead and refer to endocrinology. HPI, Exam and A&P transcribed under direction and in the presence of Miguel Aschoff, MD.  - Ambulatory referral to Endocrinology I have done the exam and reviewed the chart and it is accurate to the best of my knowledge. Development worker, community has been used and  any errors in dictation or transcription are unintentional. Miguel Aschoff M.D. [facility]

## 2016-07-28 DIAGNOSIS — L820 Inflamed seborrheic keratosis: Secondary | ICD-10-CM | POA: Diagnosis not present

## 2016-07-28 DIAGNOSIS — D485 Neoplasm of uncertain behavior of skin: Secondary | ICD-10-CM | POA: Diagnosis not present

## 2016-07-28 DIAGNOSIS — L570 Actinic keratosis: Secondary | ICD-10-CM | POA: Diagnosis not present

## 2016-07-28 DIAGNOSIS — D229 Melanocytic nevi, unspecified: Secondary | ICD-10-CM | POA: Diagnosis not present

## 2016-07-28 DIAGNOSIS — Z85828 Personal history of other malignant neoplasm of skin: Secondary | ICD-10-CM | POA: Diagnosis not present

## 2016-07-28 DIAGNOSIS — L821 Other seborrheic keratosis: Secondary | ICD-10-CM | POA: Diagnosis not present

## 2016-07-29 DIAGNOSIS — Z125 Encounter for screening for malignant neoplasm of prostate: Secondary | ICD-10-CM | POA: Diagnosis not present

## 2016-07-29 DIAGNOSIS — Z0000 Encounter for general adult medical examination without abnormal findings: Secondary | ICD-10-CM | POA: Diagnosis not present

## 2016-07-29 DIAGNOSIS — D473 Essential (hemorrhagic) thrombocythemia: Secondary | ICD-10-CM | POA: Diagnosis not present

## 2016-07-30 LAB — COMPREHENSIVE METABOLIC PANEL
A/G RATIO: 2 (ref 1.2–2.2)
ALT: 16 IU/L (ref 0–44)
AST: 22 IU/L (ref 0–40)
Albumin: 4.5 g/dL (ref 3.5–5.5)
Alkaline Phosphatase: 37 IU/L — ABNORMAL LOW (ref 39–117)
BUN/Creatinine Ratio: 20 (ref 9–20)
BUN: 20 mg/dL (ref 6–24)
Bilirubin Total: 0.6 mg/dL (ref 0.0–1.2)
CALCIUM: 9.7 mg/dL (ref 8.7–10.2)
CO2: 27 mmol/L (ref 18–29)
CREATININE: 0.98 mg/dL (ref 0.76–1.27)
Chloride: 102 mmol/L (ref 96–106)
GFR, EST AFRICAN AMERICAN: 99 mL/min/{1.73_m2} (ref >59)
GFR, EST NON AFRICAN AMERICAN: 86 mL/min/{1.73_m2} (ref >59)
GLOBULIN, TOTAL: 2.2 g/dL (ref 1.5–4.5)
Glucose: 93 mg/dL (ref 65–99)
POTASSIUM: 5.4 mmol/L — AB (ref 3.5–5.2)
SODIUM: 143 mmol/L (ref 134–144)
TOTAL PROTEIN: 6.7 g/dL (ref 6.0–8.5)

## 2016-07-30 LAB — CBC WITH DIFFERENTIAL/PLATELET
BASOS: 1 %
Basophils Absolute: 0.1 10*3/uL (ref 0.0–0.2)
EOS (ABSOLUTE): 0.2 10*3/uL (ref 0.0–0.4)
EOS: 2 %
HEMATOCRIT: 40.4 % (ref 37.5–51.0)
HEMOGLOBIN: 13.2 g/dL (ref 13.0–17.7)
IMMATURE GRANS (ABS): 0 10*3/uL (ref 0.0–0.1)
IMMATURE GRANULOCYTES: 0 %
LYMPHS: 20 %
Lymphocytes Absolute: 2.3 10*3/uL (ref 0.7–3.1)
MCH: 29.7 pg (ref 26.6–33.0)
MCHC: 32.7 g/dL (ref 31.5–35.7)
MCV: 91 fL (ref 79–97)
MONOCYTES: 7 %
MONOS ABS: 0.8 10*3/uL (ref 0.1–0.9)
NEUTROS PCT: 70 %
Neutrophils Absolute: 8.2 10*3/uL — ABNORMAL HIGH (ref 1.4–7.0)
Platelets: 539 10*3/uL — ABNORMAL HIGH (ref 150–379)
RBC: 4.45 x10E6/uL (ref 4.14–5.80)
RDW: 14.2 % (ref 12.3–15.4)
WBC: 11.6 10*3/uL — ABNORMAL HIGH (ref 3.4–10.8)

## 2016-07-30 LAB — LIPID PANEL WITH LDL/HDL RATIO
Cholesterol, Total: 220 mg/dL — ABNORMAL HIGH (ref 100–199)
HDL: 70 mg/dL (ref >39)
LDL CALC: 135 mg/dL — AB (ref 0–99)
LDL/HDL RATIO: 1.9 ratio (ref 0.0–3.6)
TRIGLYCERIDES: 74 mg/dL (ref 0–149)
VLDL Cholesterol Cal: 15 mg/dL (ref 5–40)

## 2016-07-30 LAB — TSH: TSH: 5.34 u[IU]/mL — AB (ref 0.450–4.500)

## 2016-07-30 LAB — PSA: PROSTATE SPECIFIC AG, SERUM: 4.7 ng/mL — AB (ref 0.0–4.0)

## 2016-08-01 ENCOUNTER — Telehealth: Payer: Self-pay

## 2016-08-01 NOTE — Telephone Encounter (Signed)
[id number] 

## 2016-08-01 NOTE — Telephone Encounter (Signed)
-----   Message from Jerrol Banana., MD sent at 07/30/2016 11:48 AM EST ----- Labs OK-repeat TSH next OV.

## 2016-08-01 NOTE — Telephone Encounter (Signed)
Pt advised-aa 

## 2016-08-08 ENCOUNTER — Other Ambulatory Visit (HOSPITAL_COMMUNITY): Payer: Self-pay | Admitting: Emergency Medicine

## 2016-08-08 NOTE — Patient Instructions (Addendum)
Jamason Landis [place]  08/08/2016   Your procedure is scheduled on: 08-15-16  Report to [facility]  Entrance take [facility]  elevators to 3rd floor to  [place] at 1045 AM.  Call this number if you have problems the morning of surgery [phone]   Remember: ONLY 1 PERSON MAY GO WITH YOU TO SHORT STAY TO GET  READY MORNING OF [place].  Do not eat food  :After Midnight. clear liquids from midnight until 745 am day of surgery- then nothing by mouth     Take these medicines the morning of surgery with A SIP OF WATER: none                               You may not have any metal on your body including hair pins and              piercings  Do not wear jewelry, make-up, lotions, powders or perfumes, deodorant             Do not wear nail polish.  Do not shave  48 hours prior to surgery.              Men may shave face and neck.   Do not bring valuables to the hospital. [place].  Contacts, dentures or bridgework may not be worn into surgery.  Leave suitcase in the car. After surgery it may be brought to your room.                 Please read over the following fact sheets you were given: _____________________________________________________________________                CLEAR LIQUID DIET   Foods Allowed                                                                     Foods Excluded  Coffee and tea, regular and decaf                             liquids that you cannot  Plain Jell-O in any flavor                                             see through such as: Fruit ices (not with fruit pulp)                                     milk, soups, orange juice  Iced Popsicles                                    All solid food Carbonated beverages, regular and diet  Cranberry, grape and apple juices Sports drinks like Gatorade Lightly seasoned clear broth or consume(fat  free) Sugar, honey syrup  Sample Menu Breakfast                                Lunch                                     Supper Cranberry juice                    Beef broth                            Chicken broth Jell-O                                     Grape juice                           Apple juice Coffee or tea                        Jell-O                                      Popsicle                                                Coffee or tea                        Coffee or tea  _____________________________________________________________________  [facility] - Preparing for Surgery Before surgery, you can play an important role.  Because skin is not sterile, your skin needs to be as free of germs as possible.  You can reduce the number of germs on your skin by washing with CHG (chlorahexidine gluconate) soap before surgery.  CHG is an antiseptic cleaner which kills germs and bonds with the skin to continue killing germs even after washing. Please DO NOT use if you have an allergy to CHG or antibacterial soaps.  If your skin becomes reddened/irritated stop using the CHG and inform your nurse when you arrive at Short Stay. Do not shave (including legs and underarms) for at least 48 hours prior to the first CHG shower.  You may shave your face/neck. Please follow these instructions carefully:  1.  Shower with CHG Soap the night before surgery and the  morning of Surgery.  2.  If you choose to wash your hair, wash your hair first as usual with your  normal  shampoo.  3.  After you shampoo, rinse your hair and body thoroughly to remove the  shampoo.                           4.  Use CHG as you would any other liquid soap.  You can apply chg directly  to the skin and wash  Gently with a scrungie or clean washcloth.  5.  Apply the CHG Soap to your body ONLY FROM THE NECK DOWN.   Do not use on face/ open                           Wound or open sores. Avoid contact  with eyes, ears mouth and genitals (private parts).                       Wash face,  Genitals (private parts) with your normal soap.             6.  Wash thoroughly, paying special attention to the area where your surgery  will be performed.  7.  Thoroughly rinse your body with warm water from the neck down.  8.  DO NOT shower/wash with your normal soap after using and rinsing off  the CHG Soap.                9.  Pat yourself dry with a clean towel.            10.  Wear clean pajamas.            11.  Place clean sheets on your bed the night of your first shower and do not  sleep with pets. Day of Surgery : Do not apply any lotions/deodorants the morning of surgery.  Please wear clean clothes to the hospital/surgery center.  FAILURE TO FOLLOW THESE INSTRUCTIONS MAY RESULT IN THE CANCELLATION OF YOUR SURGERY PATIENT SIGNATURE_________________________________  NURSE SIGNATURE__________________________________  ________________________________________________________________________   Adam Phenix  An incentive spirometer is a tool that can help keep your lungs clear and active. This tool measures how well you are filling your lungs with each breath. Taking long deep breaths may help reverse or decrease the chance of developing breathing (pulmonary) problems (especially infection) following:  A long period of time when you are unable to move or be active. BEFORE THE PROCEDURE   If the spirometer includes an indicator to show your best effort, your nurse or respiratory therapist will set it to a desired goal.  If possible, sit up straight or lean slightly forward. Try not to slouch.  Hold the incentive spirometer in an upright position. INSTRUCTIONS FOR USE  1. Sit on the edge of your bed if possible, or sit up as far as you can in bed or on a chair. 2. Hold the incentive spirometer in an upright position. 3. Breathe out normally. 4. Place the mouthpiece in your mouth and seal  your lips tightly around it. 5. Breathe in slowly and as deeply as possible, raising the piston or the ball toward the top of the column. 6. Hold your breath for 3-5 seconds or for as long as possible. Allow the piston or ball to fall to the bottom of the column. 7. Remove the mouthpiece from your mouth and breathe out normally. 8. Rest for a few seconds and repeat Steps 1 through 7 at least 10 times every 1-2 hours when you are awake. Take your time and take a few normal breaths between deep breaths. 9. The spirometer may include an indicator to show your best effort. Use the indicator as a goal to work toward during each repetition. 10. After each set of 10 deep breaths, practice coughing to be sure your lungs are clear. If you have an incision (the cut made at the time of  surgery), support your incision when coughing by placing a pillow or rolled up towels firmly against it. Once you are able to get out of bed, walk around indoors and cough well. You may stop using the incentive spirometer when instructed by your caregiver.  RISKS AND COMPLICATIONS  Take your time so you do not get dizzy or light-headed.  If you are in pain, you may need to take or ask for pain medication before doing incentive spirometry. It is harder to take a deep breath if you are having pain. AFTER USE  Rest and breathe slowly and easily.  It can be helpful to keep track of a log of your progress. Your caregiver can provide you with a simple table to help with this. If you are using the spirometer at home, follow these instructions: [place] IF:   You are having difficultly using the spirometer.  You have trouble using the spirometer as often as instructed.  Your pain medication is not giving enough relief while using the spirometer.  You develop fever of 100.5 F (38.1 C) or higher. SEEK IMMEDIATE MEDICAL CARE IF:   You cough up bloody sputum that had not been present before.  You develop fever of  102 F (38.9 C) or greater.  You develop worsening pain at or near the incision site. MAKE SURE YOU:   Understand these instructions.  Will watch your condition.  Will get help right away if you are not doing well or get worse. Document Released: 11/14/2006 Document Revised: 09/26/2011 Document Reviewed: 01/15/2007 ExitCare Patient Information 2014 Exit[employer], [place].   ________________________________________________________________________  WHAT IS A BLOOD TRANSFUSION? Blood Transfusion Information  A transfusion is the replacement of blood or some of its parts. Blood is made up of multiple cells which provide different functions.  Red blood cells carry oxygen and are used for blood loss replacement.  White blood cells fight against infection.  Platelets control bleeding.  Plasma helps clot blood.  Other blood products are available for specialized needs, such as hemophilia or other clotting disorders. BEFORE THE TRANSFUSION  Who gives blood for transfusions?   Healthy volunteers who are fully evaluated to make sure their blood is safe. This is blood bank blood. Transfusion therapy is the safest it has ever been in the practice of medicine. Before blood is taken from a donor, a complete history is taken to make sure that person has no history of diseases nor engages in risky social behavior (examples are intravenous drug use or sexual activity with multiple partners). The donor's travel history is screened to minimize risk of transmitting infections, such as malaria. The donated blood is tested for signs of infectious diseases, such as HIV and hepatitis. The blood is then tested to be sure it is compatible with you in order to minimize the chance of a transfusion reaction. If you or a relative donates blood, this is often done in anticipation of surgery and is not appropriate for emergency situations. It takes many days to process the donated blood. RISKS AND COMPLICATIONS Although  transfusion therapy is very safe and saves many lives, the main dangers of transfusion include:   Getting an infectious disease.  Developing a transfusion reaction. This is an allergic reaction to something in the blood you were given. Every precaution is taken to prevent this. The decision to have a blood transfusion has been considered carefully by your caregiver before blood is given. Blood is not given unless the benefits outweigh the risks. AFTER THE  TRANSFUSION  Right after receiving a blood transfusion, you will usually feel much better and more energetic. This is especially true if your red blood cells have gotten low (anemic). The transfusion raises the level of the red blood cells which carry oxygen, and this usually causes an energy increase.  The nurse administering the transfusion will monitor you carefully for complications. HOME CARE INSTRUCTIONS  No special instructions are needed after a transfusion. You may find your energy is better. Speak with your caregiver about any limitations on activity for underlying diseases you may have. SEEK MEDICAL CARE IF:   Your condition is not improving after your transfusion.  You develop redness or irritation at the intravenous (IV) site. SEEK IMMEDIATE MEDICAL CARE IF:  Any of the following symptoms occur over the next 12 hours:  Shaking chills.  You have a temperature by mouth above 102 F (38.9 C), not controlled by medicine.  Chest, back, or muscle pain.  People around you feel you are not acting correctly or are confused.  Shortness of breath or difficulty breathing.  Dizziness and fainting.  You get a rash or develop hives.  You have a decrease in urine output.  Your urine turns a dark color or changes to pink, red, or brown. Any of the following symptoms occur over the next 10 days:  You have a temperature by mouth above 102 F (38.9 C), not controlled by medicine.  Shortness of breath.  Weakness after normal  activity.  The white part of the eye turns yellow (jaundice).  You have a decrease in the amount of urine or are urinating less often.  Your urine turns a dark color or changes to pink, red, or brown. Document Released: 07/01/2000 Document Revised: 09/26/2011 Document Reviewed: 02/18/2008 [facility] Patient Information 2014 [place], [place].  _______________________________________________________________________

## 2016-08-09 ENCOUNTER — Encounter (HOSPITAL_COMMUNITY)
Admission: RE | Admit: 2016-08-09 | Discharge: 2016-08-09 | Disposition: A | Discharge status: Home / Self Care | Payer: BLUE CROSS/BLUE SHIELD | Source: Ambulatory Visit | Attending: Orthopedic Surgery | Admitting: Orthopedic Surgery

## 2016-08-09 ENCOUNTER — Encounter (HOSPITAL_COMMUNITY): Payer: Self-pay

## 2016-08-09 DIAGNOSIS — Z01812 Encounter for preprocedural laboratory examination: Secondary | ICD-10-CM | POA: Insufficient documentation

## 2016-08-09 HISTORY — DX: Unspecified osteoarthritis, unspecified site: M19.90

## 2016-08-09 LAB — ABO/RH: ABO/RH(D): O POS

## 2016-08-09 LAB — URINALYSIS, ROUTINE W REFLEX MICROSCOPIC
Bilirubin Urine: NEGATIVE
Glucose, UA: NEGATIVE mg/dL
HGB URINE DIPSTICK: NEGATIVE
Ketones, ur: NEGATIVE mg/dL
Leukocytes, UA: NEGATIVE
Nitrite: NEGATIVE
PH: 6 (ref 5.0–8.0)
Protein, ur: NEGATIVE mg/dL
SPECIFIC GRAVITY, URINE: 1.004 — AB (ref 1.005–1.030)

## 2016-08-09 LAB — PROTIME-INR
INR: 1.14
PROTHROMBIN TIME: 14.7 s (ref 11.4–15.2)

## 2016-08-09 LAB — TYPE AND SCREEN
ABO/RH(D): O POS
Antibody Screen: NEGATIVE

## 2016-08-09 LAB — APTT: APTT: 34 s (ref 24–36)

## 2016-08-09 LAB — SURGICAL PCR SCREEN
MRSA, PCR: NEGATIVE
STAPHYLOCOCCUS AUREUS: NEGATIVE

## 2016-08-12 ENCOUNTER — Ambulatory Visit: Payer: Self-pay | Admitting: Orthopedic Surgery

## 2016-08-12 NOTE — H&P (Signed)
Anthony Graves DOB: Jul 23, 1960 Married / Language: English / Race: White Male Date of Admission:  08/16/2015 CC:  Right Knee Pain History of Present Illness  The patient is a 55 year old male who comes in for a preoperative History and Physical. The patient is scheduled for a right total knee arthroplasty to be performed by Dr. Dione Plover. Aluisio, MD at [facility] on 08-15-2016. The patient is a 56 year old male who presented to the practice transitioning from another physician. The patient reports right knee symptoms including: pain, swelling, stiffness and grinding which began year(s) (worsened over the past 10 years) ago.The patient feels that the symptoms are worsening. Prior to being seen the patient was previously evaluated by a colleague (Dr. Lisette Grinder in [place], is a good friend). Current treatment includes nonsteroidal anti-inflammatory drugs (Celebrex daily x 2 years). Note for "Knee pain": History of torn PCL at age 47. No history of cortisone injections. His right knee has gotten progressively worse over time. He states that it is hurting him with almost all activities now. It is really limiting what he can and cannot do. He cannot even go for long walks any more. He wants to be more active but the knee is preventing him from doing so. He has had cortisone injections in the past but no longer beneficial. He is now reached a point where he would like to get something done about the knee. They have been treated conservatively in the past for the above stated problem and despite conservative measures, they continue to have progressive pain and severe functional limitations and dysfunction. They have failed non-operative management including home exercise, medications, and injections. It is felt that they would benefit from undergoing total joint replacement. Risks and benefits of the procedure have been discussed with the patient and they elect to proceed with surgery. There are no  active contraindications to surgery such as ongoing infection or rapidly progressive neurological disease.  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 (for high platelet count) Celecoxib (200MG  Capsule, Oral) Active. Aspirin (81MG  Tablet, Oral) Active.  Past Surgical History Arthroscopy of Knee  right; 1981, 1999  Review of Systems General Not Present- Chills, Fatigue, Fever, Memory Loss, Night Sweats, Weight Gain and Weight Loss. Skin Not Present- Eczema, Hives, Itching, Lesions and Rash. HEENT Not Present- Dentures, Double Vision, Headache, Hearing Loss, Tinnitus and Visual Loss. Respiratory Not Present- Allergies, Chronic Cough, Coughing up blood, Shortness of breath at rest and Shortness of breath with exertion. Cardiovascular Not Present- Chest Pain, Difficulty Breathing Lying Down, Murmur, Palpitations, Racing/skipping heartbeats and Swelling. Gastrointestinal Not Present- Abdominal Pain, Bloody Stool, Constipation, Diarrhea, Difficulty Swallowing, Heartburn, Jaundice, Loss of appetitie, Nausea and Vomiting. Male Genitourinary Not Present- Blood in Urine, Discharge, Flank Pain, Incontinence, Painful Urination, Urgency, Urinary frequency, Urinary Retention, Urinating at Night and Weak urinary stream. Musculoskeletal Present- Joint Pain. Not Present- Back Pain, Joint Swelling, Morning Stiffness, Muscle Pain, Muscle Weakness and Spasms. Neurological Not Present- Blackout spells, Difficulty with balance, Dizziness, Paralysis, Tremor and Weakness. Psychiatric Not Present-  Insomnia.  Vitals  Weight: 180 lb Height: 70in Body Surface Area: 2 m Body Mass Index:  25.83 kg/m  Pulse: 72 (Regular)  BP: 140/62 (Sitting, Right Arm, Standard)  Physical Exam  General Mental Status -Alert, cooperative and good historian. General Appearance-pleasant, Not in acute distress. Orientation-Oriented X3. Build & Nutrition-Well nourished and Well developed.  Head and Neck Head-normocephalic, atraumatic . Neck Global Assessment - supple, no bruit auscultated on the right, no bruit auscultated on the left.  Eye Vision-Wears corrective lenses(readers only). Pupil - Bilateral-Regular and Round. Motion - Bilateral-EOMI.  Chest and Lung Exam Auscultation Breath sounds - clear at anterior chest wall and clear at posterior chest wall. Adventitious sounds - No Adventitious sounds.  Cardiovascular Auscultation Rhythm - Regular rate and rhythm. Heart Sounds - S1 WNL and S2 WNL. Murmurs & Other Heart Sounds - Auscultation of the heart reveals - No Murmurs.  Abdomen Palpation/Percussion Tenderness - Abdomen is non-tender to palpation. Rigidity (guarding) - Abdomen is soft. Auscultation Auscultation of the abdomen reveals - Bowel sounds normal.  Male Genitourinary Note: Not done, not pertinent to present illness   Musculoskeletal Note: On exam, well-developed male, alert and oriented, in no apparent distress. Evaluation of his hips show normal range of motion. No discomfort. His left knee shows no effusion. Range 0 to 140 on the left, but no swelling, tenderness, or instability. Right knee: No effusion. Slight varus. Range 5 to 120. Marked crepitus on range of motion. Tenderness medial greater than lateral with no instability noted. Pulse, sensation and motor are intact, both lower extremities.  RADIOGRAPHS AP both knees and lateral show severe end-stage arthritis of that right knee, bone-on-bone throughout, with large osteophytes  throughout.  Assessment & Plan  Osteoarthritis Right Knee  Note:Surgical Plans: Right Total Knee Replacement  Disposition: Home and straight to outpatient therapy at [facility] outpatient therapy department on Thursday Feb 1st.  PCP: Dr. Rosanna Randy - Pending at time of H&P  IV TXA  Anesthesia Issues: None  Patient was instructed on what medications to stop prior to surgery.  Signed electronically by Ok Edwards, III PA-C

## 2016-08-15 ENCOUNTER — Inpatient Hospital Stay (HOSPITAL_COMMUNITY): Payer: BLUE CROSS/BLUE SHIELD | Admitting: Registered Nurse

## 2016-08-15 ENCOUNTER — Encounter (HOSPITAL_COMMUNITY)
Admission: RE | Disposition: A | Discharge status: Home / Self Care | Payer: Self-pay | Source: Ambulatory Visit | Attending: Orthopedic Surgery

## 2016-08-15 ENCOUNTER — Encounter (HOSPITAL_COMMUNITY): Payer: Self-pay | Admitting: *Deleted

## 2016-08-15 ENCOUNTER — Inpatient Hospital Stay (HOSPITAL_COMMUNITY)
Admission: RE | Admit: 2016-08-15 | Discharge: 2016-08-16 | DRG: 470 | Disposition: A | Discharge status: Home / Self Care | Payer: BLUE CROSS/BLUE SHIELD | Source: Ambulatory Visit | Attending: Orthopedic Surgery | Admitting: Orthopedic Surgery

## 2016-08-15 DIAGNOSIS — M1710 Unilateral primary osteoarthritis, unspecified knee: Secondary | ICD-10-CM

## 2016-08-15 DIAGNOSIS — G8918 Other acute postprocedural pain: Secondary | ICD-10-CM | POA: Diagnosis not present

## 2016-08-15 DIAGNOSIS — G542 Cervical root disorders, not elsewhere classified: Secondary | ICD-10-CM | POA: Diagnosis not present

## 2016-08-15 DIAGNOSIS — M179 Osteoarthritis of knee, unspecified: Secondary | ICD-10-CM | POA: Diagnosis present

## 2016-08-15 DIAGNOSIS — D696 Thrombocytopenia, unspecified: Secondary | ICD-10-CM | POA: Diagnosis not present

## 2016-08-15 DIAGNOSIS — M1712 Unilateral primary osteoarthritis, left knee: Secondary | ICD-10-CM | POA: Diagnosis not present

## 2016-08-15 DIAGNOSIS — M1711 Unilateral primary osteoarthritis, right knee: Principal | ICD-10-CM | POA: Diagnosis present

## 2016-08-15 HISTORY — PX: TOTAL KNEE ARTHROPLASTY: SHX125

## 2016-08-15 SURGERY — ARTHROPLASTY, KNEE, TOTAL
Anesthesia: Spinal | Site: Knee | Laterality: Right

## 2016-08-15 MED ORDER — LIDOCAINE 2% (20 MG/ML) 5 ML SYRINGE
INTRAMUSCULAR | Status: AC
  Filled 2016-08-15: qty 5

## 2016-08-15 MED ORDER — MENTHOL 3 MG MT LOZG: 1.0000 | LOZENGE | OROMUCOSAL | Status: DC | PRN

## 2016-08-15 MED ORDER — PROPOFOL 10 MG/ML IV BOLUS
INTRAVENOUS | Status: AC
  Filled 2016-08-15: qty 20

## 2016-08-15 MED ORDER — DEXAMETHASONE SODIUM PHOSPHATE 10 MG/ML IJ SOLN
10.0000 mg | Freq: Once | INTRAMUSCULAR | Status: AC
  Administered 2016-08-16: 10 mg via INTRAVENOUS
  Filled 2016-08-15: qty 1

## 2016-08-15 MED ORDER — PROPOFOL 10 MG/ML IV BOLUS
INTRAVENOUS | Status: AC
  Filled 2016-08-15: qty 40

## 2016-08-15 MED ORDER — ACETAMINOPHEN 325 MG PO TABS: 650.0000 mg | ORAL_TABLET | Freq: Four times a day (QID) | ORAL | Status: DC | PRN

## 2016-08-15 MED ORDER — POLYETHYLENE GLYCOL 3350 17 G PO PACK: 17.0000 g | PACK | Freq: Every day | ORAL | Status: DC | PRN

## 2016-08-15 MED ORDER — DIPHENHYDRAMINE HCL 12.5 MG/5ML PO ELIX: 12.5000 mg | ORAL_SOLUTION | ORAL | Status: DC | PRN

## 2016-08-15 MED ORDER — SODIUM CHLORIDE 0.9 % IV SOLN
INTRAVENOUS | Status: DC
  Administered 2016-08-15 (×2): via INTRAVENOUS

## 2016-08-15 MED ORDER — DOCUSATE SODIUM 100 MG PO CAPS
100.0000 mg | ORAL_CAPSULE | Freq: Two times a day (BID) | ORAL | Status: DC
  Administered 2016-08-15 – 2016-08-16 (×2): 100 mg via ORAL
  Filled 2016-08-15 (×2): qty 1

## 2016-08-15 MED ORDER — METOCLOPRAMIDE HCL 5 MG PO TABS
5.0000 mg | ORAL_TABLET | Freq: Three times a day (TID) | ORAL | Status: DC | PRN
Start: 2016-08-15 — End: 2016-08-16

## 2016-08-15 MED ORDER — OXYCODONE HCL 5 MG PO TABS
5.0000 mg | ORAL_TABLET | ORAL | Status: DC | PRN
  Administered 2016-08-15 – 2016-08-16 (×8): 10 mg via ORAL
  Filled 2016-08-15 (×8): qty 2

## 2016-08-15 MED ORDER — TRANEXAMIC ACID 1000 MG/10ML IV SOLN
1000.0000 mg | INTRAVENOUS | Status: AC
  Administered 2016-08-15: 1000 mg via INTRAVENOUS
  Filled 2016-08-15: qty 1100

## 2016-08-15 MED ORDER — PROMETHAZINE HCL 25 MG/ML IJ SOLN: 6.2500 mg | INTRAMUSCULAR | Status: DC | PRN

## 2016-08-15 MED ORDER — ONDANSETRON HCL 4 MG PO TABS: 4.0000 mg | ORAL_TABLET | Freq: Four times a day (QID) | ORAL | Status: DC | PRN

## 2016-08-15 MED ORDER — ACETAMINOPHEN 500 MG PO TABS
1000.0000 mg | ORAL_TABLET | Freq: Four times a day (QID) | ORAL | Status: AC
  Administered 2016-08-15 – 2016-08-16 (×4): 1000 mg via ORAL
  Filled 2016-08-15 (×4): qty 2

## 2016-08-15 MED ORDER — SODIUM CHLORIDE 0.9 % IJ SOLN
INTRAMUSCULAR | Status: DC | PRN
  Administered 2016-08-15: 30 mL

## 2016-08-15 MED ORDER — RIVAROXABAN 10 MG PO TABS
10.0000 mg | ORAL_TABLET | Freq: Every day | ORAL | Status: DC
  Administered 2016-08-16: 10 mg via ORAL
  Filled 2016-08-15: qty 1

## 2016-08-15 MED ORDER — PROPOFOL 500 MG/50ML IV EMUL
INTRAVENOUS | Status: DC | PRN
  Administered 2016-08-15: 40 ug/kg/min via INTRAVENOUS

## 2016-08-15 MED ORDER — TRAMADOL HCL 50 MG PO TABS: 50.0000 mg | ORAL_TABLET | Freq: Four times a day (QID) | ORAL | Status: DC | PRN

## 2016-08-15 MED ORDER — ACETAMINOPHEN 10 MG/ML IV SOLN
1000.0000 mg | Freq: Once | INTRAVENOUS | Status: AC
  Administered 2016-08-15: 1000 mg via INTRAVENOUS

## 2016-08-15 MED ORDER — PHENOL 1.4 % MT LIQD
1.0000 | OROMUCOSAL | Status: DC | PRN
  Filled 2016-08-15: qty 177

## 2016-08-15 MED ORDER — VANCOMYCIN HCL IN DEXTROSE 1-5 GM/200ML-% IV SOLN
1000.0000 mg | INTRAVENOUS | Status: AC
  Administered 2016-08-15: 1000 mg via INTRAVENOUS
  Filled 2016-08-15: qty 200

## 2016-08-15 MED ORDER — ANAGRELIDE HCL 0.5 MG PO CAPS
1.0000 mg | ORAL_CAPSULE | Freq: Every day | ORAL | Status: DC
  Administered 2016-08-16: 1 mg via ORAL
  Filled 2016-08-15: qty 2

## 2016-08-15 MED ORDER — BISACODYL 10 MG RE SUPP: 10.0000 mg | Freq: Every day | RECTAL | Status: DC | PRN

## 2016-08-15 MED ORDER — MORPHINE SULFATE (PF) 2 MG/ML IV SOLN
1.0000 mg | INTRAVENOUS | Status: DC | PRN
  Administered 2016-08-15 (×2): 1 mg via INTRAVENOUS
  Filled 2016-08-15 (×2): qty 1

## 2016-08-15 MED ORDER — ROPIVACAINE HCL 7.5 MG/ML IJ SOLN
INTRAMUSCULAR | Status: AC
  Filled 2016-08-15: qty 20

## 2016-08-15 MED ORDER — ONDANSETRON HCL 4 MG/2ML IJ SOLN
INTRAMUSCULAR | Status: DC | PRN
  Administered 2016-08-15: 4 mg via INTRAVENOUS

## 2016-08-15 MED ORDER — FLEET ENEMA 7-19 GM/118ML RE ENEM: 1.0000 | ENEMA | Freq: Once | RECTAL | Status: DC | PRN

## 2016-08-15 MED ORDER — HYDROMORPHONE HCL 1 MG/ML IJ SOLN: 0.2500 mg | INTRAMUSCULAR | Status: DC | PRN

## 2016-08-15 MED ORDER — METHOCARBAMOL 1000 MG/10ML IJ SOLN
500.0000 mg | Freq: Four times a day (QID) | INTRAVENOUS | Status: DC | PRN
  Administered 2016-08-15: 500 mg via INTRAVENOUS
  Filled 2016-08-15: qty 5
  Filled 2016-08-15: qty 550

## 2016-08-15 MED ORDER — FENTANYL CITRATE (PF) 100 MCG/2ML IJ SOLN
50.0000 ug | Freq: Once | INTRAMUSCULAR | Status: AC
  Administered 2016-08-15: 100 ug via INTRAVENOUS

## 2016-08-15 MED ORDER — ANAGRELIDE HCL 0.5 MG PO CAPS
0.5000 mg | ORAL_CAPSULE | Freq: Every day | ORAL | Status: DC
  Administered 2016-08-15: 0.5 mg via ORAL
  Filled 2016-08-15: qty 1

## 2016-08-15 MED ORDER — ACETAMINOPHEN 650 MG RE SUPP: 650.0000 mg | Freq: Four times a day (QID) | RECTAL | Status: DC | PRN

## 2016-08-15 MED ORDER — EPHEDRINE 5 MG/ML INJ
INTRAVENOUS | Status: AC
  Filled 2016-08-15: qty 10

## 2016-08-15 MED ORDER — BUPIVACAINE LIPOSOME 1.3 % IJ SUSP
INTRAMUSCULAR | Status: DC | PRN
  Administered 2016-08-15: 20 mL

## 2016-08-15 MED ORDER — SODIUM CHLORIDE 0.9 % IR SOLN
Status: DC | PRN
  Administered 2016-08-15: 1000 mL

## 2016-08-15 MED ORDER — ACETAMINOPHEN 10 MG/ML IV SOLN
INTRAVENOUS | Status: AC
  Filled 2016-08-15: qty 100

## 2016-08-15 MED ORDER — MIDAZOLAM HCL 2 MG/2ML IJ SOLN
INTRAMUSCULAR | Status: AC
  Filled 2016-08-15: qty 2

## 2016-08-15 MED ORDER — MIDAZOLAM HCL 2 MG/2ML IJ SOLN
1.0000 mg | Freq: Once | INTRAMUSCULAR | Status: AC
  Administered 2016-08-15: 1 mg via INTRAVENOUS

## 2016-08-15 MED ORDER — SODIUM CHLORIDE 0.9 % IJ SOLN
INTRAMUSCULAR | Status: AC
Start: 2016-08-15 — End: 2016-08-15
  Filled 2016-08-15: qty 50

## 2016-08-15 MED ORDER — CHLORHEXIDINE GLUCONATE 4 % EX LIQD: 60.0000 mL | Freq: Once | CUTANEOUS | Status: DC

## 2016-08-15 MED ORDER — FENTANYL CITRATE (PF) 100 MCG/2ML IJ SOLN
INTRAMUSCULAR | Status: AC
  Filled 2016-08-15: qty 2

## 2016-08-15 MED ORDER — 0.9 % SODIUM CHLORIDE (POUR BTL) OPTIME
TOPICAL | Status: DC | PRN
  Administered 2016-08-15: 1000 mL

## 2016-08-15 MED ORDER — DEXAMETHASONE SODIUM PHOSPHATE 10 MG/ML IJ SOLN
INTRAMUSCULAR | Status: AC
  Filled 2016-08-15: qty 1

## 2016-08-15 MED ORDER — ONDANSETRON HCL 4 MG/2ML IJ SOLN: 4.0000 mg | Freq: Four times a day (QID) | INTRAMUSCULAR | Status: DC | PRN

## 2016-08-15 MED ORDER — LIDOCAINE 2% (20 MG/ML) 5 ML SYRINGE
INTRAMUSCULAR | Status: DC | PRN
  Administered 2016-08-15: 100 mg via INTRAVENOUS

## 2016-08-15 MED ORDER — VANCOMYCIN HCL IN DEXTROSE 1-5 GM/200ML-% IV SOLN
1000.0000 mg | Freq: Two times a day (BID) | INTRAVENOUS | Status: AC
  Administered 2016-08-15: 1000 mg via INTRAVENOUS
  Filled 2016-08-15: qty 200

## 2016-08-15 MED ORDER — ONDANSETRON HCL 4 MG/2ML IJ SOLN
INTRAMUSCULAR | Status: AC
  Filled 2016-08-15: qty 2

## 2016-08-15 MED ORDER — EPHEDRINE SULFATE-NACL 50-0.9 MG/10ML-% IV SOSY
PREFILLED_SYRINGE | INTRAVENOUS | Status: DC | PRN
  Administered 2016-08-15 (×3): 5 mg via INTRAVENOUS

## 2016-08-15 MED ORDER — METHOCARBAMOL 500 MG PO TABS
500.0000 mg | ORAL_TABLET | Freq: Four times a day (QID) | ORAL | Status: DC | PRN
  Administered 2016-08-15 – 2016-08-16 (×2): 500 mg via ORAL
  Filled 2016-08-15 (×2): qty 1

## 2016-08-15 MED ORDER — TRANEXAMIC ACID 1000 MG/10ML IV SOLN
1000.0000 mg | Freq: Once | INTRAVENOUS | Status: AC
  Administered 2016-08-15: 1000 mg via INTRAVENOUS
  Filled 2016-08-15: qty 1100

## 2016-08-15 MED ORDER — METOCLOPRAMIDE HCL 5 MG/ML IJ SOLN: 5.0000 mg | Freq: Three times a day (TID) | INTRAMUSCULAR | Status: DC | PRN

## 2016-08-15 MED ORDER — DEXAMETHASONE SODIUM PHOSPHATE 10 MG/ML IJ SOLN
10.0000 mg | Freq: Once | INTRAMUSCULAR | Status: AC
  Administered 2016-08-15: 10 mg via INTRAVENOUS

## 2016-08-15 MED ORDER — LACTATED RINGERS IV SOLN
INTRAVENOUS | Status: DC
  Administered 2016-08-15: 11:00:00 via INTRAVENOUS

## 2016-08-15 MED ORDER — ROPIVACAINE HCL 7.5 MG/ML IJ SOLN
INTRAMUSCULAR | Status: DC | PRN
  Administered 2016-08-15: 20 mL via PERINEURAL

## 2016-08-15 MED ORDER — BUPIVACAINE LIPOSOME 1.3 % IJ SUSP
20.0000 mL | Freq: Once | INTRAMUSCULAR | Status: DC
  Filled 2016-08-15: qty 20

## 2016-08-15 SURGICAL SUPPLY — 49 items
BAG DECANTER FOR FLEXI CONT (MISCELLANEOUS) IMPLANT
BAG ZIPLOCK 12X15 (MISCELLANEOUS) ×2 IMPLANT
BANDAGE ACE 6X5 VEL STRL LF (GAUZE/BANDAGES/DRESSINGS) ×2 IMPLANT
BLADE SAG 18X100X1.27 (BLADE) ×2 IMPLANT
BLADE SAW SGTL 11.0X1.19X90.0M (BLADE) ×2 IMPLANT
BOWL SMART MIX CTS (DISPOSABLE) ×2 IMPLANT
CAPT KNEE TOTAL 3 ATTUNE ×2 IMPLANT
CEMENT HV SMART SET (Cement) ×4 IMPLANT
CLOTH BEACON ORANGE TIMEOUT ST (SAFETY) ×2 IMPLANT
CUFF TOURN SGL QUICK 34 (TOURNIQUET CUFF) ×1
CUFF TRNQT CYL 34X4X40X1 (TOURNIQUET CUFF) ×1 IMPLANT
DECANTER SPIKE VIAL GLASS SM (MISCELLANEOUS) ×2 IMPLANT
DRAPE U-SHAPE 47X51 STRL (DRAPES) ×2 IMPLANT
DRSG ADAPTIC 3X8 NADH LF (GAUZE/BANDAGES/DRESSINGS) ×2 IMPLANT
DRSG PAD ABDOMINAL 8X10 ST (GAUZE/BANDAGES/DRESSINGS) ×2 IMPLANT
DURAPREP 26ML APPLICATOR (WOUND CARE) ×2 IMPLANT
ELECT REM PT RETURN 9FT ADLT (ELECTROSURGICAL) ×2
ELECTRODE REM PT RTRN 9FT ADLT (ELECTROSURGICAL) ×1 IMPLANT
EVACUATOR 1/8 PVC DRAIN (DRAIN) ×2 IMPLANT
GAUZE SPONGE 4X4 12PLY STRL (GAUZE/BANDAGES/DRESSINGS) ×2 IMPLANT
GLOVE BIO SURGEON STRL SZ7.5 (GLOVE) IMPLANT
GLOVE BIO SURGEON STRL SZ8 (GLOVE) ×2 IMPLANT
GLOVE BIOGEL PI IND STRL 6.5 (GLOVE) IMPLANT
GLOVE BIOGEL PI IND STRL 8 (GLOVE) ×1 IMPLANT
GLOVE BIOGEL PI INDICATOR 6.5 (GLOVE)
GLOVE BIOGEL PI INDICATOR 8 (GLOVE) ×1
GLOVE SURG SS PI 6.5 STRL IVOR (GLOVE) IMPLANT
GOWN STRL REUS W/TWL LRG LVL3 (GOWN DISPOSABLE) ×8 IMPLANT
GOWN STRL REUS W/TWL XL LVL3 (GOWN DISPOSABLE) IMPLANT
HANDPIECE INTERPULSE COAX TIP (DISPOSABLE) ×1
IMMOBILIZER KNEE 20 (SOFTGOODS) ×2
IMMOBILIZER KNEE 20 THIGH 36 (SOFTGOODS) ×1 IMPLANT
MANIFOLD NEPTUNE II (INSTRUMENTS) ×2 IMPLANT
NS IRRIG 1000ML POUR BTL (IV SOLUTION) ×2 IMPLANT
PACK TOTAL KNEE CUSTOM (KITS) ×2 IMPLANT
PADDING CAST COTTON 6X4 STRL (CAST SUPPLIES) ×6 IMPLANT
POSITIONER SURGICAL ARM (MISCELLANEOUS) ×2 IMPLANT
SET HNDPC FAN SPRY TIP SCT (DISPOSABLE) ×1 IMPLANT
STRIP CLOSURE SKIN 1/2X4 (GAUZE/BANDAGES/DRESSINGS) ×4 IMPLANT
SUT MNCRL AB 4-0 PS2 18 (SUTURE) ×2 IMPLANT
SUT VIC AB 2-0 CT1 27 (SUTURE) ×3
SUT VIC AB 2-0 CT1 TAPERPNT 27 (SUTURE) ×3 IMPLANT
SUT VLOC 180 0 24IN GS25 (SUTURE) ×2 IMPLANT
SYR 50ML LL SCALE MARK (SYRINGE) IMPLANT
TRAY FOLEY W/METER SILVER 16FR (SET/KITS/TRAYS/PACK) ×2 IMPLANT
WATER STERILE IRR 1000ML POUR (IV SOLUTION) ×2 IMPLANT
WATER STERILE IRR 1500ML POUR (IV SOLUTION) ×2 IMPLANT
WRAP KNEE MAXI GEL POST OP (GAUZE/BANDAGES/DRESSINGS) ×2 IMPLANT
YANKAUER SUCT BULB TIP 10FT TU (MISCELLANEOUS) ×2 IMPLANT

## 2016-08-15 NOTE — Progress Notes (Signed)
AssistedDr. Massagee with right, ultrasound guided, adductor canal block. Side rails up, monitors on throughout procedure. See vital signs in flow sheet. Tolerated Procedure well.  

## 2016-08-15 NOTE — Transfer of Care (Signed)
Immediate Anesthesia Transfer of Care Note  Patient: Anthony Graves  Procedure(s) Performed: Procedure(s): RIGHT TOTAL KNEE ARTHROPLASTY (Right)  Patient Location: PACU  Anesthesia Type:Spinal  Level of Consciousness: awake, alert , oriented and patient cooperative  Airway & Oxygen Therapy: Patient Spontanous Breathing and Patient connected to face mask oxygen  Post-op Assessment: Report given to RN and Post -op Vital signs reviewed and stable  Post vital signs: Reviewed and stable  Last Vitals:  Vitals:   08/15/16 1114 08/15/16 1115  BP:  123/70  Pulse: 68 66  Resp: (!) 9 (!) 8  Temp:      Last Pain:  Vitals:   08/15/16 0922  TempSrc:   PainSc: 5       Patients Stated Pain Goal: 4 ([id number])  Complications: No apparent anesthesia complications

## 2016-08-15 NOTE — Anesthesia Procedure Notes (Signed)
Spinal  Start time: 08/15/2016 11:25 AM End time: 08/15/2016 11:29 AM Staffing Resident/CRNA: Carleene Cooper A Preanesthetic Checklist Completed: patient identified, site marked, surgical consent, pre-op evaluation, timeout performed, IV checked, risks and benefits discussed and monitors and equipment checked Spinal Block Patient position: sitting Prep: DuraPrep Patient monitoring: heart rate, continuous pulse ox and blood pressure Approach: midline Location: L3-4 Needle Needle type: Sprotte  Needle gauge: 24 G Needle length: 9 cm Assessment Sensory level: T4 Additional Notes Spinal kit expiration date checked and verified. Pt in sitting position. One attempt. + CSF. - heme. Pt tolerated well.

## 2016-08-15 NOTE — Anesthesia Preprocedure Evaluation (Addendum)
Anesthesia Evaluation  Patient identified by MRN, date of birth, ID band Patient awake    Reviewed: Allergy & Precautions, NPO status , Patient's Chart, lab work & pertinent test results  History of Anesthesia Complications Negative for: history of anesthetic complications  Airway Mallampati: I  TM Distance: >3 FB Neck ROM: Full    Dental  (+) Teeth Intact   Pulmonary neg pulmonary ROS,    breath sounds clear to auscultation       Cardiovascular negative cardio ROS   Rhythm:Regular Rate:Normal     Neuro/Psych  Neuromuscular disease    GI/Hepatic negative GI ROS, Neg liver ROS,   Endo/Other  negative endocrine ROS  Renal/GU negative Renal ROS     Musculoskeletal  (+) Arthritis ,   Abdominal   Peds  Hematology negative hematology ROS (+)   Anesthesia Other Findings   Reproductive/Obstetrics                             Anesthesia Physical Anesthesia Plan  ASA: I  Anesthesia Plan: Spinal   Post-op Pain Management:  Regional for Post-op pain   Induction: Intravenous  Airway Management Planned: Natural Airway and Simple Face Mask  Additional Equipment:   Intra-op Plan:   Post-operative Plan:   Informed Consent: I have reviewed the patients History and Physical, chart, labs and discussed the procedure including the risks, benefits and alternatives for the proposed anesthesia with the patient or authorized representative who has indicated his/her understanding and acceptance.   Dental advisory given  Plan Discussed with: CRNA  Anesthesia Plan Comments:        Anesthesia Quick Evaluation

## 2016-08-15 NOTE — Anesthesia Postprocedure Evaluation (Signed)
Anesthesia Post Note  Patient: Anthony Graves  Procedure(s) Performed: Procedure(s) (LRB): RIGHT TOTAL KNEE ARTHROPLASTY (Right)  Patient location during evaluation: PACU Anesthesia Type: Spinal Level of consciousness: oriented and awake and alert Pain management: pain level controlled Vital Signs Assessment: post-procedure vital signs reviewed and stable Respiratory status: spontaneous breathing, respiratory function stable and patient connected to nasal cannula oxygen Cardiovascular status: blood pressure returned to baseline and stable Postop Assessment: no headache and no backache Anesthetic complications: no       Last Vitals:  Vitals:   08/15/16 1430 08/15/16 1445  BP: 122/76 134/75  Pulse: 60 68  Resp: 11 14  Temp:      Last Pain:  Vitals:   08/15/16 1400  TempSrc:   PainSc: 0-No pain                 Savier Trickett S

## 2016-08-15 NOTE — Op Note (Signed)
OPERATIVE REPORT-TOTAL KNEE ARTHROPLASTY   Pre-operative diagnosis- Osteoarthritis  Right knee(s)  Post-operative diagnosis- Osteoarthritis Right knee(s)  Procedure-  Right  Total Knee Arthroplasty  Surgeon- Dione Plover. Gradie Butrick, MD  Assistant- Ardeen Jourdain, PA-C   Anesthesia-  Adductor canal block and spinal  EBL-* No blood loss amount entered *   Drains Hemovac  Tourniquet time- No tourniquet utilized    Complications- None  Condition-PACU - hemodynamically stable.   Brief Clinical Note  Anthony Graves is a 56 y.o. year old male with end stage OA of his right knee with progressively worsening pain and dysfunction. He has constant pain, with activity and at rest and significant functional deficits with difficulties even with ADLs. He has had extensive non-op management including analgesics, injections of cortisone and viscosupplements, and home exercise program, but remains in significant pain with significant dysfunction. Radiographs show bone on bone arthritis all 3 compartments with severe deformity. He presents now for right Total Knee Arthroplasty.    Procedure in detail---   The patient is brought into the operating room and positioned supine on the operating table. After successful administration of  Adductor canal block and spinal,   a tourniquet is placed high on the  Right thigh(s) and the lower extremity is prepped and draped in the usual sterile fashion. Time out is performed by the operating team and then the  Right lower extremity is wrapped in Esmarch, knee flexed and the tourniquet inflated to 300 mmHg. The tourniquet was not functional and thus was not utilized. The case was performed without the tourniquet.      A midline incision is made with a ten blade through the subcutaneous tissue to the level of the extensor mechanism. A fresh blade is used to make a medial parapatellar arthrotomy. Soft tissue over the proximal medial tibia is subperiosteally elevated to the  joint line with a knife and into the semimembranosus bursa with a Cobb elevator. Soft tissue over the proximal lateral tibia is elevated with attention being paid to avoiding the patellar tendon on the tibial tubercle. The patella is everted, knee flexed 90 degrees and the ACL and PCL are removed. Findings are bone on bone all 3 compartments with massive global osteophytes.        The drill is used to create a starting hole in the distal femur and the canal is thoroughly irrigated with sterile saline to remove the fatty contents. The 5 degree Right  valgus alignment guide is placed into the femoral canal and the distal femoral cutting block is pinned to remove 9 mm off the distal femur. Resection is made with an oscillating saw.      The tibia is subluxed forward and the menisci are removed. The extramedullary alignment guide is placed referencing proximally at the medial aspect of the tibial tubercle and distally along the second metatarsal axis and tibial crest. The block is pinned to remove 36mm off the more deficient medial  side. Resection is made with an oscillating saw. This was a large resection as this was a massive defect. Size 8is the most appropriate size for the tibia and the proximal tibia is prepared with the modular drill and keel punch for that size.      The femoral sizing guide is placed and size 8 is most appropriate. Rotation is marked off the epicondylar axis and confirmed by creating a rectangular flexion gap at 90 degrees. The size 8 cutting block is pinned in this rotation and the anterior, posterior  and chamfer cuts are made with the oscillating saw. The intercondylar block is then placed and that cut is made.      Trial size 8 tibial component, trial size 8 posterior stabilized femur and a 16  mm posterior stabilized rotating platform insert trial is placed. Full extension is achieved with excellent varus/valgus and anterior/posterior balance throughout full range of motion. The patella  is everted and thickness measured to be 27  mm. Free hand resection is taken to 15 mm, a 41 template is placed, lug holes are drilled, trial patella is placed, and it tracks normally. Osteophytes are removed off the posterior femur with the trial in place. All trials are removed and the cut bone surfaces prepared with pulsatile lavage. Cement is mixed and once ready for implantation, the size 8 tibial implant, size  8 posterior stabilized femoral component, and the size 41 patella are cemented in place and the patella is held with the clamp. The trial insert is placed and the knee held in full extension. The Exparel (20 ml mixed with 30 ml saline) is injected into the extensor mechanism, posterior capsule, medial and lateral gutters and subcutaneous tissues.  All extruded cement is removed and once the cement is hard the permanent 16 mm posterior stabilized rotating platform insert is placed into the tibial tray.      The wound is copiously irrigated with saline solution and the extensor mechanism closed over a hemovac drain with #1 V-loc suture. Flexion against gravity is 140 degrees and the patella tracks normally. Subcutaneous tissue is closed with 2.0 vicryl and subcuticular with running 4.0 Monocryl. The incision is cleaned and dried and steri-strips and a bulky sterile dressing are applied. The limb is placed into a knee immobilizer and the patient is awakened and transported to recovery in stable condition.      Please note that a surgical assistant was a medical necessity for this procedure in order to perform it in a safe and expeditious manner. Surgical assistant was necessary to retract the ligaments and vital neurovascular structures to prevent injury to them and also necessary for proper positioning of the limb to allow for anatomic placement of the prosthesis.   Dione Plover Anthony Smither, MD    08/15/2016, 12:41 PM

## 2016-08-15 NOTE — H&P (View-Only) (Signed)
Anthony Graves DOB: January 02, 1961 Married / Language: English / Race: White Male Date of Admission:  08/16/2015 CC:  Right Knee Pain History of Present Illness  The patient is a 56 year old male who comes in for a preoperative History and Physical. The patient is scheduled for a right total knee arthroplasty to be performed by Dr. Dione Plover. Aluisio, MD at [facility] on 08-15-2016. The patient is a 56 year old male who presented to the practice transitioning from another physician. The patient reports right knee symptoms including: pain, swelling, stiffness and grinding which began year(s) (worsened over the past 10 years) ago.The patient feels that the symptoms are worsening. Prior to being seen the patient was previously evaluated by a colleague (Dr. Lisette Grinder in [place], is a good friend). Current treatment includes nonsteroidal anti-inflammatory drugs (Celebrex daily x 2 years). Note for "Knee pain": History of torn PCL at age 72. No history of cortisone injections. His right knee has gotten progressively worse over time. He states that it is hurting him with almost all activities now. It is really limiting what he can and cannot do. He cannot even go for long walks any more. He wants to be more active but the knee is preventing him from doing so. He has had cortisone injections in the past but no longer beneficial. He is now reached a point where he would like to get something done about the knee. They have been treated conservatively in the past for the above stated problem and despite conservative measures, they continue to have progressive pain and severe functional limitations and dysfunction. They have failed non-operative management including home exercise, medications, and injections. It is felt that they would benefit from undergoing total joint replacement. Risks and benefits of the procedure have been discussed with the patient and they elect to proceed with surgery. There are no  active contraindications to surgery such as ongoing infection or rapidly progressive neurological disease.  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 (for high platelet count) Celecoxib (200MG  Capsule, Oral) Active. Aspirin (81MG  Tablet, Oral) Active.  Past Surgical History Arthroscopy of Knee  right; 1981, 1999  Review of Systems General Not Present- Chills, Fatigue, Fever, Memory Loss, Night Sweats, Weight Gain and Weight Loss. Skin Not Present- Eczema, Hives, Itching, Lesions and Rash. HEENT Not Present- Dentures, Double Vision, Headache, Hearing Loss, Tinnitus and Visual Loss. Respiratory Not Present- Allergies, Chronic Cough, Coughing up blood, Shortness of breath at rest and Shortness of breath with exertion. Cardiovascular Not Present- Chest Pain, Difficulty Breathing Lying Down, Murmur, Palpitations, Racing/skipping heartbeats and Swelling. Gastrointestinal Not Present- Abdominal Pain, Bloody Stool, Constipation, Diarrhea, Difficulty Swallowing, Heartburn, Jaundice, Loss of appetitie, Nausea and Vomiting. Male Genitourinary Not Present- Blood in Urine, Discharge, Flank Pain, Incontinence, Painful Urination, Urgency, Urinary frequency, Urinary Retention, Urinating at Night and Weak urinary stream. Musculoskeletal Present- Joint Pain. Not Present- Back Pain, Joint Swelling, Morning Stiffness, Muscle Pain, Muscle Weakness and Spasms. Neurological Not Present- Blackout spells, Difficulty with balance, Dizziness, Paralysis, Tremor and Weakness. Psychiatric Not Present-  Insomnia.  Vitals  Weight: 180 lb Height: 70in Body Surface Area: 2 m Body Mass Index:  25.83 kg/m  Pulse: 72 (Regular)  BP: 140/62 (Sitting, Right Arm, Standard)  Physical Exam  General Mental Status -Alert, cooperative and good historian. General Appearance-pleasant, Not in acute distress. Orientation-Oriented X3. Build & Nutrition-Well nourished and Well developed.  Head and Neck Head-normocephalic, atraumatic . Neck Global Assessment - supple, no bruit auscultated on the right, no bruit auscultated on the left.  Eye Vision-Wears corrective lenses(readers only). Pupil - Bilateral-Regular and Round. Motion - Bilateral-EOMI.  Chest and Lung Exam Auscultation Breath sounds - clear at anterior chest wall and clear at posterior chest wall. Adventitious sounds - No Adventitious sounds.  Cardiovascular Auscultation Rhythm - Regular rate and rhythm. Heart Sounds - S1 WNL and S2 WNL. Murmurs & Other Heart Sounds - Auscultation of the heart reveals - No Murmurs.  Abdomen Palpation/Percussion Tenderness - Abdomen is non-tender to palpation. Rigidity (guarding) - Abdomen is soft. Auscultation Auscultation of the abdomen reveals - Bowel sounds normal.  Male Genitourinary Note: Not done, not pertinent to present illness   Musculoskeletal Note: On exam, well-developed male, alert and oriented, in no apparent distress. Evaluation of his hips show normal range of motion. No discomfort. His left knee shows no effusion. Range 0 to 140 on the left, but no swelling, tenderness, or instability. Right knee: No effusion. Slight varus. Range 5 to 120. Marked crepitus on range of motion. Tenderness medial greater than lateral with no instability noted. Pulse, sensation and motor are intact, both lower extremities.  RADIOGRAPHS AP both knees and lateral show severe end-stage arthritis of that right knee, bone-on-bone throughout, with large osteophytes  throughout.  Assessment & Plan  Osteoarthritis Right Knee  Note:Surgical Plans: Right Total Knee Replacement  Disposition: Home and straight to outpatient therapy at [facility] outpatient therapy department on Thursday Feb 1st.  PCP: Dr. Rosanna Randy - Pending at time of H&P  IV TXA  Anesthesia Issues: None  Patient was instructed on what medications to stop prior to surgery.  Signed electronically by Ok Edwards, III PA-C

## 2016-08-15 NOTE — Anesthesia Procedure Notes (Addendum)
Anesthesia Regional Block:  Adductor canal block  Pre-Anesthetic Checklist: ,, timeout performed, Correct Patient, Correct Site, Correct Laterality, Correct Procedure, Correct Position, site marked, Risks and benefits discussed,  Surgical consent,  Pre-op evaluation,  At surgeon's request and post-op pain management  Laterality: Right and Lower  Prep: chloraprep       Needles:      Needle Length: 9cm 9 cm Needle Gauge: 21 and 21 G  Needle insertion depth: 5 cm   Additional Needles:  Procedures: ultrasound guided (picture in chart) Adductor canal block Narrative:  Start time: 08/15/2016 10:55 AM End time: 08/15/2016 11:12 AM Injection made incrementally with aspirations every 5 mL.  Performed by: Personally  Anesthesiologist: Brixton Schnapp

## 2016-08-15 NOTE — Interval H&P Note (Signed)
History and Physical Interval Note:  08/15/2016 10:39 AM  Anthony Graves  has presented today for surgery, with the diagnosis of RIGHT KNEE OA  The various methods of treatment have been discussed with the patient and family. After consideration of risks, benefits and other options for treatment, the patient has consented to  Procedure(s): RIGHT TOTAL KNEE ARTHROPLASTY (Right) as a surgical intervention .  The patient's history has been reviewed, patient examined, no change in status, stable for surgery.  I have reviewed the patient's chart and labs.  Questions were answered to the patient's satisfaction.     Gearlean Alf

## 2016-08-15 NOTE — Progress Notes (Signed)
Patient is admitted from PACU to 1615. Admission VS is stable and patient is AO X 4.

## 2016-08-16 LAB — BASIC METABOLIC PANEL
ANION GAP: 5 (ref 5–15)
BUN: 16 mg/dL (ref 6–20)
CALCIUM: 8.2 mg/dL — AB (ref 8.9–10.3)
CO2: 26 mmol/L (ref 22–32)
CREATININE: 0.85 mg/dL (ref 0.61–1.24)
Chloride: 106 mmol/L (ref 101–111)
Glucose, Bld: 140 mg/dL — ABNORMAL HIGH (ref 65–99)
Potassium: 4.3 mmol/L (ref 3.5–5.1)
SODIUM: 137 mmol/L (ref 135–145)

## 2016-08-16 LAB — CBC
HEMATOCRIT: 34.3 % — AB (ref 39.0–52.0)
Hemoglobin: 11.1 g/dL — ABNORMAL LOW (ref 13.0–17.0)
MCH: 29.8 pg (ref 26.0–34.0)
MCHC: 32.4 g/dL (ref 30.0–36.0)
MCV: 92 fL (ref 78.0–100.0)
Platelets: 616 10*3/uL — ABNORMAL HIGH (ref 150–400)
RBC: 3.73 MIL/uL — ABNORMAL LOW (ref 4.22–5.81)
RDW: 13.7 % (ref 11.5–15.5)
WBC: 24.6 10*3/uL — AB (ref 4.0–10.5)

## 2016-08-16 MED ORDER — OXYCODONE HCL 5 MG PO TABS: 5.0000 mg | ORAL_TABLET | ORAL | 0 refills | Status: DC | PRN

## 2016-08-16 MED ORDER — RIVAROXABAN 10 MG PO TABS: 10.0000 mg | ORAL_TABLET | Freq: Every day | ORAL | 0 refills | Status: DC

## 2016-08-16 MED ORDER — METHOCARBAMOL 500 MG PO TABS: 500.0000 mg | ORAL_TABLET | Freq: Four times a day (QID) | ORAL | 0 refills | Status: DC | PRN

## 2016-08-16 MED ORDER — TRAMADOL HCL 50 MG PO TABS: 50.0000 mg | ORAL_TABLET | Freq: Four times a day (QID) | ORAL | 0 refills | Status: DC | PRN

## 2016-08-16 NOTE — Evaluation (Signed)
Occupational Therapy Evaluation Patient Details Name: Anthony Graves MRN: [id number] DOB: 1960-08-02 Today's Date: 08/16/2016    History of Present Illness RTKR   Clinical Impression   OT education complete regarding ADL Activity s/p TKR    Follow Up Recommendations  No OT follow up    Equipment Recommendations  3 in 1 bedside commode    Recommendations for Other Services       Precautions / Restrictions Precautions Precautions: Knee Restrictions Weight Bearing Restrictions: No      Mobility Bed Mobility Overal bed mobility: Needs Assistance Bed Mobility: Supine to Sit     Supine to sit: Min assist     General bed mobility comments: VC for technique  Transfers Overall transfer level: Needs assistance Equipment used: Rolling walker (2 wheeled) Transfers: Sit to/from [employer] Sit to Stand: Min guard Stand pivot transfers: Min guard       General transfer comment: VC for technique         ADL Overall ADL's : Needs assistance/impaired     Grooming: Standing;Min guard           Upper Body Dressing : Set up;Sitting   Lower Body Dressing: Min guard;Sit to/from stand;Cueing for safety;Cueing for sequencing   Toilet Transfer: Min guard;RW;Ambulation   Toileting- Water quality scientist and Hygiene: Min guard;Sit to/from stand;Cueing for safety;Cueing for sequencing     Tub/Shower Transfer Details (indicate cue type and reason): verbalized safety for walk in  shower Functional mobility during ADLs: Min guard;Cueing for sequencing;Cueing for safety;Rolling walker                 Pertinent Vitals/Pain Pain Assessment: 0-10 Pain Score: 3  Pain Location: R knee Pain Descriptors / Indicators: Sore Pain Intervention(s): Monitored during session     Hand Dominance     Extremity/Trunk Assessment Upper Extremity Assessment Upper Extremity Assessment: Overall WFL for tasks assessed           Communication  Communication Communication: No difficulties   Cognition Arousal/Alertness: Awake/alert Behavior During Therapy: WFL for tasks assessed/performed Overall Cognitive Status: Within Functional Limits for tasks assessed                                Home Living Family/patient expects to be discharged to:: Private residence Living Arrangements: Spouse/significant other Available Help at Discharge: Family Type of Home: House Home Access: Stairs to enter [employer] of Steps: 2   Home Layout: Two level;Full bath on main level;Able to live on main level with bedroom/bathroom     Bathroom Shower/Tub: Occupational psychologist: Standard     Home Equipment: None          Prior Functioning/Environment Level of Independence: Independent                       OT Goals(Current goals can be found in the care plan section) Acute Rehab OT Goals Patient Stated Goal: get back to taking long walks, tennis and being activie OT Goal Formulation: With patient  OT Frequency:                End of Session Equipment Utilized During Treatment: Rolling walker CPM Right Knee CPM Right Knee: Off Nurse Communication: Mobility status  Activity Tolerance: Patient tolerated treatment well Patient left: in chair;with call bell/phone within reach;with family/visitor present   Time: 1005-1030 OT Time Calculation (min): 25 min  Charges:  OT General Charges $OT Visit: 1 Procedure OT Evaluation $OT Eval Low Complexity: 1 Procedure OT Treatments $[place] Care/Home Management : 8-22 mins G-Codes:    Betsy Pries 2016-09-04, 10:49 AM

## 2016-08-16 NOTE — Discharge Summary (Signed)
Physician Discharge Summary   Patient ID: Anthony Graves MRN: [id number] DOB/AGE: 57/07/1960 57 y.o.  Admit date: 08/15/2016 Discharge date: 08/16/2016  Primary Diagnosis:  Osteoarthritis  Right knee(s) Admission Diagnoses:  Past Medical History:  Diagnosis Date  . Arthritis    osteoartiritis  . 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   Weight as of this encounter: 81.2 kg (179 lb).  Procedure:  Procedure(s) (LRB): RIGHT TOTAL KNEE ARTHROPLASTY (Right)   Consults: None  HPI: Anthony Graves is a 56 y.o. year old male with end stage OA of his right knee with progressively worsening pain and dysfunction. He has constant pain, with activity and at rest and significant functional deficits with difficulties even with ADLs. He has had extensive non-op management including analgesics, injections of cortisone and viscosupplements, and home exercise program, but remains in significant pain with significant dysfunction. Radiographs show bone on bone arthritis all 3 compartments with severe deformity. He presents now for right Total Knee Arthroplasty.  Laboratory Data: Admission on 08/15/2016  Component Date Value Ref Range Status  . WBC 08/16/2016 24.6* 4.0 - 10.5 K/uL Final  . RBC 08/16/2016 3.73* 4.22 - 5.81 MIL/uL Final  . Hemoglobin 08/16/2016 11.1* 13.0 - 17.0 g/dL Final  . HCT 08/16/2016 34.3* 39.0 - 52.0 % Final  . MCV 08/16/2016 92.0  78.0 - 100.0 fL Final  . MCH 08/16/2016 29.8  26.0 - 34.0 pg Final  . MCHC 08/16/2016 32.4  30.0 - 36.0 g/dL Final  . RDW 08/16/2016 13.7  11.5 - 15.5 % Final  . Platelets 08/16/2016 616* 150 - 400 K/uL Final  . Sodium 08/16/2016 137  135 - 145 mmol/L Final  . Potassium 08/16/2016 4.3  3.5 - 5.1 mmol/L Final  . Chloride 08/16/2016 106  101 - 111 mmol/L Final  . CO2  08/16/2016 26  22 - 32 mmol/L Final  . Glucose, Bld 08/16/2016 140* 65 - 99 mg/dL Final  . BUN 08/16/2016 16  6 - 20 mg/dL Final  . Creatinine, Ser 08/16/2016 0.85  0.61 - 1.24 mg/dL Final  . Calcium 08/16/2016 8.2* 8.9 - 10.3 mg/dL Final  . GFR calc non Af Amer 08/16/2016 >60  >60 mL/min Final  . GFR calc Af Amer 08/16/2016 >60  >60 mL/min Final   Comment: (NOTE) The eGFR has been calculated using the CKD EPI equation. This calculation has not been validated in all clinical situations. eGFR's persistently <60 mL/min signify possible Chronic Kidney Disease.   Georgiann Hahn gap 08/16/2016 5  5 - 15 Final  Hospital Outpatient Visit on 08/09/2016  Component Date Value Ref Range Status  . aPTT 08/09/2016 34  24 - 36 seconds Final  . Prothrombin Time 08/09/2016 14.7  11.4 - 15.2 seconds Final  . INR 08/09/2016 1.14   Final  . ABO/RH(D) 08/09/2016 O POS   Final  . Antibody Screen 08/09/2016 NEG   Final  . Sample Expiration 08/09/2016 08/23/2016   Final  . Extend sample reason 08/09/2016 NO TRANSFUSIONS OR PREGNANCY IN THE PAST 3 MONTHS   Final  . Color, Urine 08/09/2016 STRAW* YELLOW Final  . APPearance 08/09/2016 CLEAR  CLEAR Final  . Specific Gravity, Urine 08/09/2016 1.004* 1.005 - 1.030 Final  . pH 08/09/2016 6.0  5.0 - 8.0 Final  .  Glucose, UA 08/09/2016 NEGATIVE  NEGATIVE mg/dL Final  . Hgb urine dipstick 08/09/2016 NEGATIVE  NEGATIVE Final  . Bilirubin Urine 08/09/2016 NEGATIVE  NEGATIVE Final  . Ketones, ur 08/09/2016 NEGATIVE  NEGATIVE mg/dL Final  . Protein, ur 08/09/2016 NEGATIVE  NEGATIVE mg/dL Final  . Nitrite 08/09/2016 NEGATIVE  NEGATIVE Final  . Leukocytes, UA 08/09/2016 NEGATIVE  NEGATIVE Final  . MRSA, PCR 08/09/2016 NEGATIVE  NEGATIVE Final  . Staphylococcus aureus 08/09/2016 NEGATIVE  NEGATIVE Final   Comment:        The Xpert SA Assay (FDA approved for NASAL specimens in patients over 57 years of age), is one component of a comprehensive surveillance program.  Test  performance has been validated by [facility] for patients greater than or equal to 50 year old. It is not intended to diagnose infection nor to guide or monitor treatment.   . ABO/RH(D) 08/09/2016 O POS   Final  Office Visit on 07/26/2016  Component Date Value Ref Range Status  . WBC 07/29/2016 11.6* 3.4 - 10.8 x10E3/uL Final  . RBC 07/29/2016 4.45  4.14 - 5.80 x10E6/uL Final  . Hemoglobin 07/29/2016 13.2  13.0 - 17.7 g/dL Final  . Hematocrit 07/29/2016 40.4  37.5 - 51.0 % Final  . MCV 07/29/2016 91  79 - 97 fL Final  . MCH 07/29/2016 29.7  26.6 - 33.0 pg Final  . MCHC 07/29/2016 32.7  31.5 - 35.7 g/dL Final  . RDW 07/29/2016 14.2  12.3 - 15.4 % Final  . Platelets 07/29/2016 539* 150 - 379 x10E3/uL Final  . Neutrophils 07/29/2016 70  Not Estab. % Final  . Lymphs 07/29/2016 20  Not Estab. % Final  . Monocytes 07/29/2016 7  Not Estab. % Final  . Eos 07/29/2016 2  Not Estab. % Final  . Basos 07/29/2016 1  Not Estab. % Final  . Neutrophils Absolute 07/29/2016 8.2* 1.4 - 7.0 x10E3/uL Final  . Lymphocytes Absolute 07/29/2016 2.3  0.7 - 3.1 x10E3/uL Final  . Monocytes Absolute 07/29/2016 0.8  0.1 - 0.9 x10E3/uL Final  . EOS (ABSOLUTE) 07/29/2016 0.2  0.0 - 0.4 x10E3/uL Final  . Basophils Absolute 07/29/2016 0.1  0.0 - 0.2 x10E3/uL Final  . Immature Granulocytes 07/29/2016 0  Not Estab. % Final  . Immature Grans (Abs) 07/29/2016 0.0  0.0 - 0.1 x10E3/uL Final  . Glucose 07/29/2016 93  65 - 99 mg/dL Final  . BUN 07/29/2016 20  6 - 24 mg/dL Final  . Creatinine, Ser 07/29/2016 0.98  0.76 - 1.27 mg/dL Final  . GFR calc non Af Amer 07/29/2016 86  >59 mL/min/1.73 Final  . GFR calc Af Amer 07/29/2016 99  >59 mL/min/1.73 Final  . BUN/Creatinine Ratio 07/29/2016 20  9 - 20 Final  . Sodium 07/29/2016 143  134 - 144 mmol/L Final  . Potassium 07/29/2016 5.4* 3.5 - 5.2 mmol/L Final  . Chloride 07/29/2016 102  96 - 106 mmol/L Final  . CO2 07/29/2016 27  18 - 29 mmol/L Final  . Calcium 07/29/2016  9.7  8.7 - 10.2 mg/dL Final  . Total Protein 07/29/2016 6.7  6.0 - 8.5 g/dL Final  . Albumin 07/29/2016 4.5  3.5 - 5.5 g/dL Final  . Globulin, Total 07/29/2016 2.2  1.5 - 4.5 g/dL Final  . Albumin/Globulin Ratio 07/29/2016 2.0  1.2 - 2.2 Final  . Bilirubin Total 07/29/2016 0.6  0.0 - 1.2 mg/dL Final  . Alkaline Phosphatase 07/29/2016 37* 39 - 117 IU/L Final  . AST 07/29/2016 22  0 - 40 IU/L Final  . ALT 07/29/2016 16  0 - 44 IU/L Final  . Cholesterol, Total 07/29/2016 220* 100 - 199 mg/dL Final  . Triglycerides 07/29/2016 74  0 - 149 mg/dL Final  . HDL 07/29/2016 70  >39 mg/dL Final  . VLDL Cholesterol Cal 07/29/2016 15  5 - 40 mg/dL Final  . LDL Calculated 07/29/2016 135* 0 - 99 mg/dL Final  . LDl/HDL Ratio 07/29/2016 1.9  0.0 - 3.6 ratio units Final   Comment:                                     LDL/HDL Ratio                                             Men  Women                               1/2 Avg.Risk  1.0    1.5                                   Avg.Risk  3.6    3.2                                2X Avg.Risk  6.2    5.0                                3X Avg.Risk  8.0    6.1   . TSH 07/29/2016 5.340* 0.450 - 4.500 uIU/mL Final  . Prostate Specific Ag, Serum 07/29/2016 4.7* 0.0 - 4.0 ng/mL Final   Comment: Roche ECLIA methodology. According to the American Urological Association, Serum PSA should decrease and remain at undetectable levels after radical prostatectomy. The AUA defines biochemical recurrence as an initial PSA value 0.2 ng/mL or greater followed by a subsequent confirmatory PSA value 0.2 ng/mL or greater. Values obtained with different assay methods or kits cannot be used interchangeably. Results cannot be interpreted as absolute evidence of the presence or absence of malignant disease.   . IFOBT 07/26/2016 Negative   Final     X-Rays:No results found.  EKG: Orders placed or performed in visit on 07/26/16  . EKG 12-Lead     Hospital Course: Anthony Graves  is a 56 y.o. who was admitted to [facility]. They were brought to the operating room on 08/15/2016 and underwent Procedure(s): RIGHT TOTAL KNEE ARTHROPLASTY.  Patient tolerated the procedure well and was later transferred to the recovery room and then to the orthopaedic floor for postoperative care.  They were given PO and IV analgesics for pain control following their surgery.  They were given 24 hours of postoperative antibiotics of  Anti-infectives    Start     Dose/Rate Route Frequency Ordered Stop   08/15/16 2330  vancomycin (VANCOCIN) IVPB 1000 mg/200 mL premix     1,000 mg 200 mL/hr over 60 Minutes Intravenous Every 12 hours 08/15/16 1614 08/16/16 0052   08/15/16 0855  vancomycin (VANCOCIN) IVPB 1000 mg/200 mL  premix     1,000 mg 200 mL/hr over 60 Minutes Intravenous On call to O.R. 08/15/16 2595 08/15/16 1211     and started on DVT prophylaxis in the form of Xarelto.   PT and OT were ordered for total joint protocol.  Discharge planning consulted to help with postop disposition and equipment needs.  Patient had a good night on the evening of surgery.  They started to get up OOB with therapy on day one. Hemovac drain was pulled without difficulty.  Dressing was checked and was clean and dry.   Patient was seen in rounds and was ready to go home.   Diet - Regular diet Follow up - in 2 weeks Activity - WBAT Disposition - Home Condition Upon Discharge - home if improving D/C Meds - See DC Summary DVT Prophylaxis - Xarelto   Discharge Instructions    Call MD / Call 911    Complete by:  As directed    If you experience chest pain or shortness of breath, CALL 911 and be transported to the hospital emergency room.  If you develope a fever above 101 F, pus (white drainage) or increased drainage or redness at the wound, or calf pain, call your surgeon's office.   Change dressing    Complete by:  As directed    Change dressing daily with sterile 4 x 4 inch gauze dressing and apply  TED hose. Do not submerge the incision under water.   Constipation Prevention    Complete by:  As directed    Drink plenty of fluids.  Prune juice may be helpful.  You may use a stool softener, such as Colace (over the counter) 100 mg twice a day.  Use MiraLax (over the counter) for constipation as needed.   Diet general    Complete by:  As directed    Discharge instructions    Complete by:  As directed    Pick up stool softner and laxative for home use following surgery while on pain medications. Do not submerge incision under water. Please use good hand washing techniques while changing dressing each day. May shower starting three days after surgery. Please use a clean towel to pat the incision dry following showers. Continue to use ice for pain and swelling after surgery. Do not use any lotions or creams on the incision until instructed by your surgeon.  Wear both TED hose on both legs during the day every day for three weeks, but may have off at night at home.  Postoperative Constipation Protocol  Constipation - defined medically as fewer than three stools per week and severe constipation as less than one stool per week.  One of the most common issues patients have following surgery is constipation.  Even if you have a regular bowel pattern at home, your normal regimen is likely to be disrupted due to multiple reasons following surgery.  Combination of anesthesia, postoperative narcotics, change in appetite and fluid intake all can affect your bowels.  In order to avoid complications following surgery, here are some recommendations in order to help you during your recovery period.  Colace (docusate) - Pick up an over-the-counter form of Colace or another stool softener and take twice a day as long as you are requiring postoperative pain medications.  Take with a full glass of water daily.  If you experience loose stools or diarrhea, hold the colace until you stool forms back up.  If your  symptoms do not get better within 1  week or if they get worse, check with your doctor.  Dulcolax (bisacodyl) - Pick up over-the-counter and take as directed by the product packaging as needed to assist with the movement of your bowels.  Take with a full glass of water.  Use this product as needed if not relieved by Colace only.   MiraLax (polyethylene glycol) - Pick up over-the-counter to have on hand.  MiraLax is a solution that will increase the amount of water in your bowels to assist with bowel movements.  Take as directed and can mix with a glass of water, juice, soda, coffee, or tea.  Take if you go more than two days without a movement. Do not use MiraLax more than once per day. Call your doctor if you are still constipated or irregular after using this medication for 7 days in a row.  If you continue to have problems with postoperative constipation, please contact the office for further assistance and recommendations.  If you experience "the worst abdominal pain ever" or develop nausea or vomiting, please contact the office immediatly for further recommendations for treatment.   Take Xarelto for two and a half more weeks, then discontinue Xarelto. Once the patient has completed the Xarelto, they may resume the 81 mg Aspirin.   Do not put a pillow under the knee. Place it under the heel.    Complete by:  As directed    Do not sit on low chairs, stoools or toilet seats, as it may be difficult to get up from low surfaces    Complete by:  As directed    Driving restrictions    Complete by:  As directed    No driving until released by the physician.   Increase activity slowly as tolerated    Complete by:  As directed    Lifting restrictions    Complete by:  As directed    No lifting until released by the physician.   Patient may shower    Complete by:  As directed    You may shower without a dressing once there is no drainage.  Do not wash over the wound.  If drainage remains, do not  shower until drainage stops.   TED hose    Complete by:  As directed    Use stockings (TED hose) for 3 weeks on both leg(s).  You may remove them at night for sleeping.   Weight bearing as tolerated    Complete by:  As directed    Laterality:  right   Extremity:  Lower     Allergies as of 08/16/2016      Reactions   Penicillins    Reaction as a child Has patient had a PCN reaction causing immediate rash, facial/tongue/throat swelling, SOB or lightheadedness with hypotension:unsure Has patient had a PCN reaction causing severe rash involving mucus membranes or skin necrosis:unsure Has patient had a PCN reaction that required hospitalization:No Has patient had a PCN reaction occurring within the last 10 years: No If all of the above answers are "NO", then may proceed with Cephalosporin use.      Medication List    STOP taking these medications   aspirin EC 81 MG tablet   celecoxib 200 MG capsule Commonly known as:  CELEBREX     TAKE these medications   anagrelide 0.5 MG capsule Commonly known as:  AGRYLIN 2 cap(s) orally in am and 1 cap orally in pm What changed:  how much to take  how to take  this  when to take this  additional instructions   methocarbamol 500 MG tablet Commonly known as:  ROBAXIN Take 1 tablet (500 mg total) by mouth every 6 (six) hours as needed for muscle spasms.   oxyCODONE 5 MG immediate release tablet Commonly known as:  Oxy IR/ROXICODONE Take 1-2 tablets (5-10 mg total) by mouth every 4 (four) hours as needed for moderate pain or severe pain.   rivaroxaban 10 MG Tabs tablet Commonly known as:  XARELTO Take 1 tablet (10 mg total) by mouth daily with breakfast. Take Xarelto for two and a half more weeks following discharge from the hospital, then discontinue Xarelto. Once the patient has completed the Xarelto, they may resume the 81 mg Aspirin.   traMADol 50 MG tablet Commonly known as:  ULTRAM Take 1-2 tablets (50-100 mg total) by mouth  every 6 (six) hours as needed for moderate pain.      Follow-up Information    Gearlean Alf, MD. Schedule an appointment as soon as possible for a visit on 08/30/2016.   Specialty:  Orthopedic Surgery Contact information: [address] [place] [address] [phone]           Signed: Arlee Muslim, PA-C Orthopaedic Surgery 08/16/2016, 7:26 AM

## 2016-08-16 NOTE — Progress Notes (Signed)
   08/16/16 1300  PT Visit Information  Last PT Received On 08/16/16  Assistance Needed +1  History of Present Illness RTKR  Subjective Data  Patient Stated Goal get back to taking long walks, tennis and being activie  Precautions  Precautions Knee  Precaution Comments discussed and reviewed KI, fairly good quad control even thought SLRs difficult and 10* lag present  Required Braces or Orthoses Knee Immobilizer - Right  Knee Immobilizer - Right Discontinue once straight leg raise with < 10 degree lag  Restrictions  Weight Bearing Restrictions No  Other Position/Activity Restrictions WBAT  Pain Assessment  Pain Assessment 0-10  Pain Score 5  Pain Location R knee and thigh  Pain Descriptors / Indicators Sore  Pain Intervention(s) Limited activity within patient's tolerance;Monitored during session;Premedicated before session;Ice applied  Cognition  Arousal/Alertness Awake/alert  Behavior During Therapy WFL for tasks assessed/performed  Overall Cognitive Status Within Functional Limits for tasks assessed  Bed Mobility  Overal bed mobility Needs Assistance  General bed mobility comments NT in chair  Transfers  Overall transfer level Needs assistance  Equipment used Rolling walker (2 wheeled);Crutches  Transfers Sit to/from Stand  Sit to Stand Supervision  General transfer comment verbal cues for safety, hand placement and RLE position  Ambulation/Gait  Ambulation/Gait assistance Supervision;Min guard  Ambulation Distance (Feet) 100 Feet  Assistive device Rolling walker (2 wheeled);Crutches  Gait Pattern/deviations Step-to pattern;Step-through pattern;Antalgic  General Gait Details cues for technique, step through, knee ext during stance  Total Joint Exercises  Ankle Circles/Pumps AROM;Both;10 reps  [employer] 10 reps;Both;AROM  Straight Leg Raises AROM;AAROM;Strengthening;Right;10 reps  Short Arc Quad AAROM;Right;10 reps  Heel Slides AAROM;Right;10 reps (instructed in use of  sheet to Wind assist)  Hip ABduction/ADduction AROM;AAROM;Right;10 reps  Knee Flexion AAROM;Right;5 reps;Seated  Goniometric ROM grossly -10 to 65* right knee felxion AAROM in sitting  PT - End of Session  Equipment Utilized During Treatment Gait belt  Activity Tolerance Patient tolerated treatment well  Patient left in chair;with call bell/phone within reach;with family/visitor present  Nurse Communication Mobility status  PT - Assessment/Plan  PT Plan Current plan remains appropriate  PT Frequency (ACUTE ONLY) 7X/week  Follow Up Recommendations Outpatient PT  PT equipment Rolling walker with 5" wheels;Crutches  PT Goal Progression  Progress towards PT goals Progressing toward goals  Acute Rehab PT Goals  PT Goal Formulation With patient  Time For Goal Achievement 08/18/16  Potential to Achieve Goals Good  PT Time Calculation  PT Start Time (ACUTE ONLY) 1219  PT Stop Time (ACUTE ONLY) 1253  PT Time Calculation (min) (ACUTE ONLY) 34 min  PT General Charges  $$ ACUTE PT VISIT 1 Procedure  PT Treatments  $Gait Training 8-22 mins  $Therapeutic Exercise 8-22 mins

## 2016-08-16 NOTE — Care Management Note (Signed)
Case Management Note  Patient Details  Name: Anthony Graves MRN: [id number] Date of Birth: [birth year]  Subjective/Objective:                  RIGHT TOTAL KNEE ARTHROPLASTY (Right) Action/Plan: Discharge planning Expected Discharge Date:  08/16/16               Expected Discharge Plan:  Home/[place] Care  In-House Referral:     Discharge planning Services  CM Consult  Post Acute Care Choice:  Durable Medical Equipment Choice offered to:  Patient  DME Arranged:  3-N-1, Walker rolling DME Agency:  [place]:  NA [place] Agency:  NA  Status of Service:  Completed, signed off  If discussed at [place] of Stay Meetings, dates discussed:    Additional Comments: CM met with pt in room to confirm plan is for outpt PT; pt confirms. CM notified [facility] DME rep, to please deliver the rolling walker and 3n1 to room prior to discharge. No other CM needs were communicated. Dellie Catholic, RN 08/16/2016, 11:51 AM

## 2016-08-16 NOTE — Discharge Instructions (Addendum)
° °Dr. Frank Aluisio °Total Joint Specialist °[facility] °[address] °[place], [place] [address] °[phone] ° °TOTAL KNEE REPLACEMENT POSTOPERATIVE DIRECTIONS ° °Knee Rehabilitation, Guidelines Following Surgery  °Results after knee surgery are often greatly improved when you follow the exercise, range of motion and muscle strengthening exercises prescribed by your doctor. Safety measures are also important to protect the knee from further injury. Any time any of these exercises cause you to have increased pain or swelling in your knee joint, decrease the amount until you are comfortable again and slowly increase them. If you have problems or questions, call your caregiver or physical therapist for advice.  ° °HOME CARE INSTRUCTIONS  °Remove items at home which could result in a fall. This includes throw rugs or furniture in walking pathways.  °· ICE to the affected knee every three hours for 30 minutes at a time and then as needed for pain and swelling.  Continue to use ice on the knee for pain and swelling from surgery. You may notice swelling that will progress down to the foot and ankle.  This is normal after surgery.  Elevate the leg when you are not up walking on it.   °· Continue to use the breathing machine which will help keep your temperature down.  It is common for your temperature to cycle up and down following surgery, especially at night when you are not up moving around and exerting yourself.  The breathing machine keeps your lungs expanded and your temperature down. °· Do not place pillow under knee, focus on keeping the knee straight while resting ° °DIET °You may resume your previous home diet once your are discharged from the hospital. ° °DRESSING / WOUND CARE / SHOWERING °You may shower 3 days after surgery, but keep the wounds dry during showering.  You may use an occlusive plastic wrap (Press'n Seal for example), NO SOAKING/SUBMERGING IN THE BATHTUB.  If the  bandage gets wet, change with a clean dry gauze.  If the incision gets wet, pat the wound dry with a clean towel. °You may start showering once you are discharged home but do not submerge the incision under water. Just pat the incision dry and apply a dry gauze dressing on daily. °Change the surgical dressing daily and reapply a dry dressing each time. ° °ACTIVITY °Walk with your walker as instructed. °Use walker as long as suggested by your caregivers. °Avoid periods of inactivity such as sitting longer than an hour when not asleep. This helps prevent blood clots.  °You may resume a sexual relationship in one month or when given the OK by your doctor.  °You may return to work once you are cleared by your doctor.  °Do not drive a car for 6 weeks or until released by you surgeon.  °Do not drive while taking narcotics. ° °WEIGHT BEARING °Weight bearing as tolerated with assist device (walker, cane, etc) as directed, use it as long as suggested by your surgeon or therapist, typically at least 4-6 weeks. ° °POSTOPERATIVE CONSTIPATION PROTOCOL °Constipation - defined medically as fewer than three stools per week and severe constipation as less than one stool per week. ° °One of the most common issues patients have following surgery is constipation.  Even if you have a regular bowel pattern at home, your normal regimen is likely to be disrupted due to multiple reasons following surgery.  Combination of anesthesia, postoperative narcotics, change in appetite and fluid intake all can affect your bowels.    In order to avoid complications following surgery, here are some recommendations in order to help you during your recovery period. ° °Colace (docusate) - Pick up an over-the-counter form of Colace or another stool softener and take twice a day as long as you are requiring postoperative pain medications.  Take with a full glass of water daily.  If you experience loose stools or diarrhea, hold the colace until you stool forms  back up.  If your symptoms do not get better within 1 week or if they get worse, check with your doctor. ° °Dulcolax (bisacodyl) - Pick up over-the-counter and take as directed by the product packaging as needed to assist with the movement of your bowels.  Take with a full glass of water.  Use this product as needed if not relieved by Colace only.  ° °MiraLax (polyethylene glycol) - Pick up over-the-counter to have on hand.  MiraLax is a solution that will increase the amount of water in your bowels to assist with bowel movements.  Take as directed and can mix with a glass of water, juice, soda, coffee, or tea.  Take if you go more than two days without a movement. °Do not use MiraLax more than once per day. Call your doctor if you are still constipated or irregular after using this medication for 7 days in a row. ° °If you continue to have problems with postoperative constipation, please contact the office for further assistance and recommendations.  If you experience "the worst abdominal pain ever" or develop nausea or vomiting, please contact the office immediatly for further recommendations for treatment. ° °ITCHING ° If you experience itching with your medications, try taking only a single pain pill, or even half a pain pill at a time.  You can also use Benadryl over the counter for itching or also to help with sleep.  ° °TED HOSE STOCKINGS °Wear the elastic stockings on both legs for three weeks following surgery during the day but you may remove then at night for sleeping. ° °MEDICATIONS °See your medication summary on the “After Visit Summary” that the nursing staff will review with you prior to discharge.  You may have some home medications which will be placed on hold until you complete the course of blood thinner medication.  It is important for you to complete the blood thinner medication as prescribed by your surgeon.  Continue your approved medications as instructed at time of  discharge. ° °PRECAUTIONS °If you experience chest pain or shortness of breath - call 911 immediately for transfer to the hospital emergency department.  °If you develop a fever greater that 101 F, purulent drainage from wound, increased redness or drainage from wound, foul odor from the wound/dressing, or calf pain - CONTACT YOUR SURGEON.   °                                                °FOLLOW-UP APPOINTMENTS °Make sure you keep all of your appointments after your operation with your surgeon and caregivers. You should call the office at the above phone number and make an appointment for approximately two weeks after the date of your surgery or on the date instructed by your surgeon outlined in the "After Visit Summary". ° ° °RANGE OF MOTION AND STRENGTHENING EXERCISES  °Rehabilitation of the knee is important following a knee injury or   an operation. After just a few days of immobilization, the muscles of the thigh which control the knee become weakened and shrink (atrophy). Knee exercises are designed to build up the tone and strength of the thigh muscles and to improve knee motion. Often times heat used for twenty to thirty minutes before working out will loosen up your tissues and help with improving the range of motion but do not use heat for the first two weeks following surgery. These exercises can be done on a training (exercise) mat, on the floor, on a table or on a bed. Use what ever works the best and is most comfortable for you Knee exercises include:  °Leg Lifts - While your knee is still immobilized in a splint or cast, you can do straight leg raises. Lift the leg to 60 degrees, hold for 3 sec, and slowly lower the leg. Repeat 10-20 times 2-3 times daily. Perform this exercise against resistance later as your knee gets better.  °Quad and Hamstring Sets - Tighten up the muscle on the front of the thigh (Quad) and hold for 5-10 sec. Repeat this 10-20 times hourly. Hamstring sets are done by pushing the  foot backward against an object and holding for 5-10 sec. Repeat as with quad sets.  °· Leg Slides: Lying on your back, slowly slide your foot toward your buttocks, bending your knee up off the floor (only go as far as is comfortable). Then slowly slide your foot back down until your leg is flat on the floor again. °· Angel Wings: Lying on your back spread your legs to the side as far apart as you can without causing discomfort.  °A rehabilitation program following serious knee injuries can speed recovery and prevent re-injury in the future due to weakened muscles. Contact your doctor or a physical therapist for more information on knee rehabilitation.  ° °IF YOU ARE TRANSFERRED TO A SKILLED REHAB FACILITY °If the patient is transferred to a skilled rehab facility following release from the hospital, a list of the current medications will be sent to the facility for the patient to continue.  When discharged from the skilled rehab facility, please have the facility set up the patient's Home Health Physical Therapy prior to being released. Also, the skilled facility will be responsible for providing the patient with their medications at time of release from the facility to include their pain medication, the muscle relaxants, and their blood thinner medication. If the patient is still at the rehab facility at time of the two week follow up appointment, the skilled rehab facility will also need to assist the patient in arranging follow up appointment in our office and any transportation needs. ° °MAKE SURE YOU:  °Understand these instructions.  °Get help right away if you are not doing well or get worse.  ° ° °Pick up stool softner and laxative for home use following surgery while on pain medications. °Do not submerge incision under water. °Please use good hand washing techniques while changing dressing each day. °May shower starting three days after surgery. °Please use a clean towel to pat the incision dry following  showers. °Continue to use ice for pain and swelling after surgery. °Do not use any lotions or creams on the incision until instructed by your surgeon. ° °Take Xarelto for two and a half more weeks following discharge from the hospital, then discontinue Xarelto. °Once the patient has completed the Xarelto, they may resume the 81 mg Aspirin. ° ° ° °Information on   my medicine - XARELTO (Rivaroxaban)  This medication education was reviewed with me or my healthcare representative as part of my discharge preparation.  The pharmacist that spoke with me during my hospital stay was:  [facility] Aalyiah Camberos, Student-PharmD  Why was Xarelto prescribed for you? Xarelto was prescribed for you to reduce the risk of blood clots forming after orthopedic surgery. The medical term for these abnormal blood clots is venous thromboembolism (VTE).  What do you need to know about xarelto ? Take your Xarelto ONCE DAILY at the same time every day. You may take it either with or without food.  If you have difficulty swallowing the tablet whole, you may crush it and mix in applesauce just prior to taking your dose.  Take Xarelto exactly as prescribed by your doctor and DO NOT stop taking Xarelto without talking to the doctor who prescribed the medication.  Stopping without other VTE prevention medication to take the place of Xarelto may increase your risk of developing a clot.  After discharge, you should have regular check-up appointments with your healthcare provider that is prescribing your Xarelto.    What do you do if you miss a dose? If you miss a dose, take it as soon as you remember on the same day then continue your regularly scheduled once daily regimen the next day. Do not take two doses of Xarelto on the same day.   Important Safety Information A possible side effect of Xarelto is bleeding. You should call your healthcare provider right away if you experience any of the following: ? Bleeding from an injury  or your nose that does not stop. ? Unusual colored urine (red or dark brown) or unusual colored stools (red or black). ? Unusual bruising for unknown reasons. ? A serious fall or if you hit your head (even if there is no bleeding).  Some medicines may interact with Xarelto and might increase your risk of bleeding while on Xarelto. To help avoid this, consult your healthcare provider or pharmacist prior to using any new prescription or non-prescription medications, including herbals, vitamins, non-steroidal anti-inflammatory drugs (NSAIDs) and supplements.  This website has more information on Xarelto: [email or web address]

## 2016-08-16 NOTE — Evaluation (Signed)
Physical Therapy Evaluation Patient Details Name: Anthony Graves MRN: [id number] DOB: 1960/10/21 Today's Date: 08/16/2016   History of Present Illness  RTKR  Clinical Impression  Pt is s/p TKA resulting in the deficits listed below (see PT Problem List).  Pt will benefit from skilled PT to increase their independence and safety with mobility to allow discharge to the venue listed below. Will see again for stair training     Follow Up Recommendations Outpatient PT    Equipment Recommendations  Rolling walker with 5" wheels    Recommendations for Other Services       Precautions / Restrictions Precautions Precautions: Knee Required Braces or Orthoses: Knee Immobilizer - Right Knee Immobilizer - Right: Discontinue once straight leg raise with < 10 degree lag Restrictions Weight Bearing Restrictions: No Other Position/Activity Restrictions: WBAT      Mobility  Bed Mobility Overal bed mobility: Needs Assistance Bed Mobility: Supine to Sit     Supine to sit: Min assist     General bed mobility comments: OOB with OT  Transfers Overall transfer level: Needs assistance Equipment used: Rolling walker (2 wheeled) Transfers: Sit to/from Stand Sit to Stand: Supervision Stand pivot transfers: Min guard       General transfer comment: verbal cues for safety, hand placement and RLE position  Ambulation/Gait Ambulation/Gait assistance: Min guard Ambulation Distance (Feet): 80 Feet Assistive device: Rolling walker (2 wheeled) Gait Pattern/deviations: Step-to pattern;Antalgic     General Gait Details: cues for RW postion, step length  Stairs            Wheelchair Mobility    Modified Rankin (Stroke Patients Only)       Balance                                             Pertinent Vitals/Pain Pain Assessment: 0-10 Pain Score: 3  Pain Location: R knee Pain Descriptors / Indicators: Sore Pain Intervention(s): Monitored during session     Home Living Family/patient expects to be discharged to:: Private residence Living Arrangements: Spouse/significant other Available Help at Discharge: Family Type of Home: House Home Access: Stairs to enter Entrance Stairs-Rails: Right Entrance Stairs-Number of Steps: 2 Home Layout: Two level;Full bath on main level;Able to live on main level with bedroom/bathroom Home Equipment: None Additional Comments: sunken living room with with 2 steps down     Prior Function Level of Independence: Independent               Hand Dominance        Extremity/Trunk Assessment   Upper Extremity Assessment Upper Extremity Assessment: Defer to OT evaluation;Overall WFL for tasks assessed    Lower Extremity Assessment Lower Extremity Assessment: RLE deficits/detail RLE Deficits / Details: ankle WFL, knee extension and hip flexion 2-/5, limited by post op pain and weakness       Communication   Communication: No difficulties  Cognition Arousal/Alertness: Awake/alert Behavior During Therapy: WFL for tasks assessed/performed Overall Cognitive Status: Within Functional Limits for tasks assessed                      General Comments      Exercises Total Joint Exercises Ankle Circles/Pumps: AROM;Both;10 reps Quad Sets: 10 reps;Both;AROM Straight Leg Raises: AROM;AAROM;Strengthening;Right;10 reps   Assessment/Plan    PT Assessment Patient needs continued PT services  PT Problem List Decreased strength;Decreased  range of motion;Decreased activity tolerance;Decreased mobility;Decreased knowledge of use of DME;Pain          PT Treatment Interventions DME instruction;Gait training;Functional mobility training;Therapeutic activities;Therapeutic exercise;Stair training;Patient/family education    PT Goals (Current goals can be found in the Care Plan section)  Acute Rehab PT Goals Patient Stated Goal: get back to taking long walks, tennis and being activie PT Goal  Formulation: With patient Time For Goal Achievement: 08/18/16 Potential to Achieve Goals: Good    Frequency 7X/week   Barriers to discharge        Co-evaluation               End of Session   Activity Tolerance: Patient tolerated treatment well Patient left: in chair;with call bell/phone within reach;with family/visitor present           Time: [id number] PT Time Calculation (min) (ACUTE ONLY): 21 min   Charges:   PT Evaluation $PT Eval Low Complexity: 1 Procedure     PT G Codes:        Dequane Strahan 2016-09-14, 11:56 AM

## 2016-08-16 NOTE — Progress Notes (Signed)
   Subjective: 1 Day Post-Op Procedure(s) (LRB): RIGHT TOTAL KNEE ARTHROPLASTY (Right) Patient reports pain as mild.   Patient seen in rounds by Dr. Wynelle Link. Patient is well, but has had some minor complaints of pain in the knee, requiring pain medications We will start therapy today.  If they do well with therapy and meets all goals, then will allow home later this afternoon following therapy. Plan is to go Home after hospital stay.  Objective: Vital signs in last 24 hours: Temp:  [98 F (36.7 C)-99 F (37.2 C)] 98.5 F (36.9 C) (01/30 0559) Pulse Rate:  [59-82] 66 (01/30 0559) Resp:  [8-18] 16 (01/30 0559) BP: (106-148)/(65-79) 132/68 (01/30 0559) SpO2:  [98 %-100 %] 99 % (01/30 0559) Weight:  [81.2 kg (179 lb)] 81.2 kg (179 lb) (01/29 0922)  Intake/Output from previous day:  Intake/Output Summary (Last 24 hours) at 08/16/16 0716 Last data filed at 08/16/16 0600  Gross per 24 hour  Intake             4400 ml  Output             1760 ml  Net             2640 ml    Intake/Output this shift: No intake/output data recorded.  Labs:  Recent Labs  08/16/16 0431  HGB 11.1*    Recent Labs  08/16/16 0431  WBC 24.6*  RBC 3.73*  HCT 34.3*  PLT 616*    Recent Labs  08/16/16 0431  NA 137  K 4.3  CL 106  CO2 26  BUN 16  CREATININE 0.85  GLUCOSE 140*  CALCIUM 8.2*   No results for input(s): LABPT, INR in the last 72 hours.  EXAM General - Patient is Alert and Appropriate Extremity - Neurovascular intact Sensation intact distally Dressing - dressing C/D/I Motor Function - intact, moving foot and toes well on exam.  Hemovac pulled without difficulty.  Past Medical History:  Diagnosis Date  . Arthritis    osteoartiritis  . High platelet count (HCC)     Assessment/Plan: 1 Day Post-Op Procedure(s) (LRB): RIGHT TOTAL KNEE ARTHROPLASTY (Right) Principal Problem:   OA (osteoarthritis) of knee  Estimated body mass index is 25.68 kg/m as calculated from  the following:   Height as of this encounter: 5\' 10"  (1.778 m).   Weight as of this encounter: 81.2 kg (179 lb). Up with therapy Discharge home - Home and straight to outpatient therapy at [facility] outpatient therapy department on Thursday Feb 1st.  DVT Prophylaxis - Xarelto Weight-Bearing as tolerated to right leg D/C O2 and Pulse OX and try on Room Air  If meets goals and able to go home: Diet - Regular diet Follow up - in 2 weeks Activity - WBAT Disposition - Home Condition Upon Discharge - home if improving D/C Meds - See DC Summary DVT Prophylaxis - [place], PA-C Orthopaedic Surgery

## 2016-08-18 ENCOUNTER — Ambulatory Visit: Payer: BLUE CROSS/BLUE SHIELD | Attending: Orthopedic Surgery | Admitting: Physical Therapy

## 2016-08-18 DIAGNOSIS — Z96651 Presence of right artificial knee joint: Secondary | ICD-10-CM

## 2016-08-18 DIAGNOSIS — M25561 Pain in right knee: Secondary | ICD-10-CM | POA: Diagnosis present

## 2016-08-18 DIAGNOSIS — R262 Difficulty in walking, not elsewhere classified: Secondary | ICD-10-CM | POA: Diagnosis present

## 2016-08-18 DIAGNOSIS — G8929 Other chronic pain: Secondary | ICD-10-CM | POA: Diagnosis present

## 2016-08-20 DIAGNOSIS — M1710 Unilateral primary osteoarthritis, unspecified knee: Secondary | ICD-10-CM | POA: Diagnosis not present

## 2016-08-23 ENCOUNTER — Ambulatory Visit: Payer: BLUE CROSS/BLUE SHIELD | Admitting: Physical Therapy

## 2016-08-23 DIAGNOSIS — M25561 Pain in right knee: Secondary | ICD-10-CM | POA: Diagnosis not present

## 2016-08-23 DIAGNOSIS — G8929 Other chronic pain: Secondary | ICD-10-CM

## 2016-08-23 DIAGNOSIS — Z96651 Presence of right artificial knee joint: Secondary | ICD-10-CM

## 2016-08-23 DIAGNOSIS — R262 Difficulty in walking, not elsewhere classified: Secondary | ICD-10-CM

## 2016-08-23 NOTE — Therapy (Signed)
[place] [address], [place], [address] Phone: [phone]   Fax:  [phone]  Physical Therapy Treatment  Patient Details  Name: Anthony Graves MRN: [id number] Date of Birth: February 09, 1961 No Data Recorded  Encounter Date: 08/23/2016      PT End of Session - 08/23/16 1257    Visit Number 2   Number of Visits 13   Date for PT Re-Evaluation 10/11/16   PT Start Time 0945   PT Stop Time 1030   PT Time Calculation (min) 45 min   Equipment Utilized During Treatment Gait belt   Activity Tolerance Patient tolerated treatment well   Behavior During Therapy [facility] for tasks assessed/performed      Past Medical History:  Diagnosis Date  . Arthritis    osteoartiritis  . High platelet count ([place])     Past Surgical History:  Procedure Laterality Date  . COLONOSCOPY  2016  . CYSTECTOMY  years ago   pilonidal cyst removed  . KNEE SURGERY Right 20 years ago   arthrocscopy  . TOTAL KNEE ARTHROPLASTY Right 08/15/2016   Procedure: RIGHT TOTAL KNEE ARTHROPLASTY;  Surgeon: Gaynelle Arabian, MD;  Location: [facility];  Service: Orthopedics;  Laterality: Right;    There were no vitals filed for this visit.      Subjective Assessment - 08/23/16 1255    Subjective Patient reports his R quadricep has felt much better since previous session, he does report he began having sharp R posterior thigh/hip pain after completing HEP, which has lessened over the past few days.    Patient is accompained by: Family member   Limitations Lifting;Walking;Standing;House hold activities   Patient Stated Goals Pt reports he would like to return to tennis, golf, and 4-5 mile walks.   Currently in Pain? Yes  R posterior thigh   Pain Descriptors / Indicators Aching;Sharp   Pain Type Surgical pain   Pain Onset In the past 7 days   Pain Frequency Intermittent      Continuous E-stim provided to distal quadricep in long sitting at 14 mA for 11  minutes while completing 3 sets x 20 of AAROM (through end range) long arc quads for pain control and improving quadricep function/output.   Observed gait mechanics - patient demonstrates improved flexion-extension cycle through gait, unable to perform 1 crutch safely at this point (excessive lateral flexion and WBing through UE). Improved weight bearing through RLE as well.   3 bouts x 2 minutes of dangling knee flexion with gentle over pressure to increase knee flexion ROM  4-46 degrees of knee joint motion   Soft tissue mobilization over posterior R thigh as patient was reporting this was initially painful, after completion patient reports significant reduction in symptoms.                            PT Education - 08/23/16 1256    Education provided Yes   Education Details Focus on knee flexion ROM, knee extension is improving significantly.    Person(s) Educated Patient;Spouse   Methods Explanation;Demonstration;Verbal cues   Comprehension Returned demonstration;Verbalized understanding          PT Short Term Goals - 08/18/16 1158      PT SHORT TERM GOAL #1   Title Pt will increase knee extension ROM to -10 deg   Baseline -18 deg   Time 2   Period Weeks   Status New  PT SHORT TERM GOAL #2   Title Pt will increase knee extension ROM to 110 deg.   Baseline 40 deg   Time 2   Period Weeks           PT Long Term Goals - 08/18/16 1202      PT LONG TERM GOAL #1   Title Pt will walk an 18 hole golf course without pain to return to recreational activities.    Baseline unable to walk golf course   Time 6   Period Weeks   Status New     PT LONG TERM GOAL #2   Title Pt will walk 4-5 miles to return to recreational activities.    Baseline unable   Time 6   Period Weeks   Status New     PT LONG TERM GOAL #3   Title Pt will score a 25 on the FGA to demonstrate independence and safety with mobility.    Time 6   Period Weeks   Status New      PT LONG TERM GOAL #4   Title  Pt will reach R knee extension of 0 deg to demonstrate optimal ROM for long term pain reduction.    Baseline -18 deg   Time 6   Period Weeks   Status New     PT LONG TERM GOAL #5   Title Pt will reach R knee flexion of 110 deg. to complete functional tasks such as stair ascent/descent.    Baseline 40 deg   Time 6   Period Weeks   Status New               Plan - 08/23/16 1302    Clinical Impression Statement Patient demonstrates significant improved in knee extension ROM, mild improvement in knee flexion this date. He reports less anterior thigh pain and is able to provide increased weightbearing through his RLE while amublating with crutches. He does report posterior thigh pain starting after HEP, with STM today this appears to resolve. He is progressing well, though additional focus will be on increased knee flexion ROM.    Rehab Potential Excellent   PT Frequency 2x / week   PT Duration 6 weeks   PT Treatment/Interventions ADLs/[place] Care Home Management;Electrical Stimulation;Therapeutic exercise;Passive range of motion;Patient/family education   PT Next Visit Plan Pt will work on PROM as well as pain modulation using e-stim. Quad activation will also be addressed using e-stim.   PT Home Exercise Plan Pt was given an HEP to work on extension with towel under the heel  in supine.    Consulted and Agree with Plan of Care Patient;Family member/caregiver      Patient will benefit from skilled therapeutic intervention in order to improve the following deficits and impairments:  Abnormal gait, Pain, Decreased mobility, Decreased strength, Difficulty walking, Decreased range of motion, Increased edema  Visit Diagnosis: Chronic pain of right knee  Status post right knee replacement  Difficulty in walking, not elsewhere classified     Problem List Patient Active Problem List   Diagnosis Date Noted  . OA (osteoarthritis) of knee 08/15/2016   . 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 Personal history of diseases of skin or subcutaneous tissue 07/30/2015  . Essential thrombocytosis ([place]) 07/30/2015    Royce Macadamia PT, DPT, CSCS     08/23/2016, 1:05 PM  [place] [facility] [address], [place], [address] Phone: [phone]   Fax:  [phone]  Name: Anthony Graves MRN: [id number] Date of Birth: 14-Sep-1960

## 2016-08-23 NOTE — Therapy (Signed)
[facility] [facility] [address], [place], [address] Phone: [phone]   Fax:  [phone]  Physical Therapy Evaluation  Patient Details  Name: Anthony Graves MRN: [id number] Date of Birth: Jul 05, 1961 No Data Recorded  Encounter Date: 08/18/2016      PT End of Session - 08/23/16 1218    Visit Number 1   Number of Visits 13   Date for PT Re-Evaluation 10/11/16   PT Start Time 0945   PT Stop Time 1045   PT Time Calculation (min) 60 min   Equipment Utilized During Treatment Gait belt   Activity Tolerance Patient tolerated treatment well;Patient limited by pain   Behavior During Therapy [facility] for tasks assessed/performed      Past Medical History:  Diagnosis Date  . Arthritis    osteoartiritis  . High platelet count ([place])     Past Surgical History:  Procedure Laterality Date  . COLONOSCOPY  2016  . CYSTECTOMY  years ago   pilonidal cyst removed  . KNEE SURGERY Right 20 years ago   arthrocscopy  . TOTAL KNEE ARTHROPLASTY Right 08/15/2016   Procedure: RIGHT TOTAL KNEE ARTHROPLASTY;  Surgeon: Gaynelle Arabian, MD;  Location: [facility];  Service: Orthopedics;  Laterality: Right;    There were no vitals filed for this visit.       Subjective Assessment - 08/23/16 1209    Subjective Pt is a 56 yo male who presents to the clinic s/p TKR 3 days. He reports some swelling in the R quad. He denies using DME prior to the surgery. Pt lives in a two story home with his wife. 2 STE home with a railing and 12-15 steps to the 2nd story. Pt does not report calf pain. Pt stated that he would like to return to golfing, walking 4-5 miles, and recreational tennis.   Patient is accompained by: Family member   Patient Stated Goals Pt reports he would like to return to tennis, golf, and 4-5 mile walks.   Currently in Pain? Yes  Patient reports moderate to severe R thigh/quadricep pain.    Pain Location Leg   Pain Orientation Right   Pain Descriptors / Indicators Aching;Operative site guarding   Pain Type Surgical pain   Pain Frequency Constant     Knee ROM to begin session 18 degrees - 40 degrees (extension-flexion)   Gait pattern - patient using crutches, he has decreased stance time on RLE with minimal knee flexion-extension during gait cycle. Heavy reliance on crutches for weightbearing.   Patient reports he is having a lot of pain and difficulty with bringing his RLE over the threshold of a table/bed. Educated patient on hooking LLE underneath RLE for support to swing legs in which he reported has being much less painful.   Soft tissue mobilization provided to distal and mid quadriceps in areas where patient reported pain, patient tolerated well and reported much less pain after completion.   E-stim provided on continuous cycle with 14 mA provided through distal quadricep with cuing for performing long arc quads to facilitate quadricep function.   SLRs -- unable to complete independently, able to complete with min A x 10 on RLE   Manual A-P mobilizations provided in long sitting to talus for increased knee flexion ROM x 3 minutes (well tolerated through tolerated ROM).   Observed patient ascend/descend stairs with crutches, step to pattern with appropriate technique demonstrated.   A-P mobilizations with towel roll underneath heel provided  x 2 minutes through grade I-II -- well tolerated at distal femur/tibia for increased knee extension.                           PT Education - 08/23/16 1210    Education provided Yes   Education Details Pt was educated on sit<>supine by crossing the L under the R leg to help lift when transferring in and out of bed. HEP and progression of therapy   Person(s) Educated Patient;Spouse   Methods Explanation;Demonstration;Verbal cues;Handout   Comprehension Verbalized understanding;Returned demonstration          PT Short Term Goals - 08/18/16 1158       PT SHORT TERM GOAL #1   Title Pt will increase knee extension ROM to -10 deg   Baseline -18 deg   Time 2   Period Weeks   Status New     PT SHORT TERM GOAL #2   Title Pt will increase knee extension ROM to 110 deg.   Baseline 40 deg   Time 2   Period Weeks           PT Long Term Goals - 08/18/16 1202      PT LONG TERM GOAL #1   Title Pt will walk an 18 hole golf course without pain to return to recreational activities.    Baseline unable to walk golf course   Time 6   Period Weeks   Status New     PT LONG TERM GOAL #2   Title Pt will walk 4-5 miles to return to recreational activities.    Baseline unable   Time 6   Period Weeks   Status New     PT LONG TERM GOAL #3   Title Pt will score a 25 on the FGA to demonstrate independence and safety with mobility.    Time 6   Period Weeks   Status New     PT LONG TERM GOAL #4   Title  Pt will reach R knee extension of 0 deg to demonstrate optimal ROM for long term pain reduction.    Baseline -18 deg   Time 6   Period Weeks   Status New     PT LONG TERM GOAL #5   Title Pt will reach R knee flexion of 110 deg. to complete functional tasks such as stair ascent/descent.    Baseline 40 deg   Time 6   Period Weeks   Status New               Plan - 08/23/16 1211    Clinical Impression Statement Patient is a 56 y/o male 3 days s/p R TKR. He is able to load his RLE fairly well, but is quite limited in ROM and mobility secondary to pain initially. He is unable to complete SLRs without support and is lacking over 10 degrees of knee extension currently. He has chosen to use crutches as he felt this provided a more normalized gait pattern than RW. Patient is making good progress for this point in his rehabilitation and would benefit from skilled PT services to address his deficits in strength, ROM, and gait mechanics.    Rehab Potential Excellent   PT Frequency 2x / week   PT Duration 6 weeks   PT Treatment/Interventions  ADLs/[place] Care Home Management;Electrical Stimulation;Therapeutic exercise;Passive range of motion;Patient/family education   PT Next Visit Plan Pt will work on PROM as well as  pain modulation using e-stim. Quad activation will also be addressed using e-stim.   PT Home Exercise Plan Pt was given an HEP to work on extension with towel under the heel  in supine.    Consulted and Agree with Plan of Care Patient;Family member/caregiver      Patient will benefit from skilled therapeutic intervention in order to improve the following deficits and impairments:  Abnormal gait, Pain, Decreased mobility, Decreased strength, Difficulty walking, Decreased range of motion, Increased edema  Visit Diagnosis: Chronic pain of right knee - Plan: PT PLAN OF CARE CERT/RE-CERT  Status post right knee replacement - Plan: PT PLAN OF CARE CERT/RE-CERT  Difficulty in walking, not elsewhere classified - Plan: PT PLAN OF CARE CERT/RE-CERT     Problem List Patient Active Problem List   Diagnosis Date Noted  . OA (osteoarthritis) of knee 08/15/2016  . 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 Personal history of diseases of skin or subcutaneous tissue 07/30/2015  . Essential thrombocytosis ([place]) 07/30/2015   Kerman Passey, PT, DPT    08/23/2016, 12:20 PM  [facility] [address], [place], [address] Phone: [phone]   Fax:  [phone]  Name: Dodd Scianna Stoudt MRN: [id number] Date of Birth: Nov 12, 1960

## 2016-08-25 ENCOUNTER — Ambulatory Visit: Payer: BLUE CROSS/BLUE SHIELD | Admitting: Physical Therapy

## 2016-08-25 DIAGNOSIS — M25561 Pain in right knee: Secondary | ICD-10-CM | POA: Diagnosis not present

## 2016-08-25 DIAGNOSIS — Z96651 Presence of right artificial knee joint: Secondary | ICD-10-CM

## 2016-08-25 DIAGNOSIS — R262 Difficulty in walking, not elsewhere classified: Secondary | ICD-10-CM

## 2016-08-25 DIAGNOSIS — G8929 Other chronic pain: Secondary | ICD-10-CM

## 2016-08-25 NOTE — Patient Instructions (Addendum)
   Knee able to flex to maximum of 62-63 degrees at the conclusion of the session.   Soft tissue mobilization provided to quadricep -- for pain relief as patient reported he was having pain in between bouts of knee flexion mobilizations.   Long arc quads with E-stim performed 3 sets x 15 repetitions (9mA provided to distal quadriceps)   A-P mobilizations provided to distal tibia in long sitting, primarily allowing gravity to assist with increased flexion x 7 bouts (several bouts with E-stim applied to distal quadricep on continuous [place] Current @ 14 mA x 5 minutes)   Gait observation - patient initially presents with decreased knee flexion and antalgic pattern, after mobilizations provided noted to increase knee flexion through gait cycle, though continues to lack fully expected knee flexion.

## 2016-08-28 NOTE — Therapy (Signed)
[facility] [address] [place], [address] Phone: [phone]   Fax:  [phone]  Physical Therapy Treatment  Patient Details  Name: Anthony Graves MRN: [id number] Date of Birth: 07-May-1961 No Data Recorded  Encounter Date: 08/25/2016      PT End of Session - 08/28/16 2228    Visit Number 3   Number of Visits 13   Date for PT Re-Evaluation 10/11/16   PT Start Time 0950   PT Stop Time 1030   PT Time Calculation (min) 40 min   Activity Tolerance Patient tolerated treatment well   Behavior During Therapy [facility] for tasks assessed/performed      Past Medical History:  Diagnosis Date  . Arthritis    osteoartiritis  . High platelet count ([place])     Past Surgical History:  Procedure Laterality Date  . COLONOSCOPY  2016  . CYSTECTOMY  years ago   pilonidal cyst removed  . KNEE SURGERY Right 20 years ago   arthrocscopy  . TOTAL KNEE ARTHROPLASTY Right 08/15/2016   Procedure: RIGHT TOTAL KNEE ARTHROPLASTY;  Surgeon: Gaynelle Arabian, MD;  Location: [facility];  Service: Orthopedics;  Laterality: Right;    There were no vitals filed for this visit.      Subjective Assessment - 08/28/16 2231    Subjective Patient reports discomfort in his hip is largely alleviated though he was having more intense knee pain/swelling after last session, though this has decreased with time.    Patient is accompained by: Family member   Limitations Lifting;Walking;Standing;House hold activities   Patient Stated Goals Pt reports he would like to return to tennis, golf, and 4-5 mile walks.   Currently in Pain? Yes  Reports pain/achiness and stiffness throughout R knee and thigh consistent with post-surgical pain   Pain Location Knee   Pain Orientation Right   Pain Descriptors / Indicators Aching;Operative site guarding   Pain Type Chronic pain   Pain Onset In the past 7 days   Pain Frequency Constant        Knee able to flex  to maximum of 62-63 degrees at the conclusion of the session.   Soft tissue mobilization provided to quadricep -- for pain relief as patient reported he was having pain in between bouts of knee flexion mobilizations.   Long arc quads with E-stim performed 3 sets x 15 repetitions (60mA provided to distal quadriceps for pain control)   A-P mobilizations provided to distal tibia in long sitting, primarily allowing gravity to assist with increased flexion x 7 bouts (several bouts with E-stim applied to distal quadricep on continuous [place] Current @ 14 mA x 5 minutes)   Gait observation - patient initially presents with decreased knee flexion and antalgic pattern, after mobilizations provided noted to increase knee flexion through gait cycle, though continues to lack fully expected knee flexion.                           PT Education - 08/28/16 2228    Education provided Yes   Education Details Patient will need to continue to focus on knee flexion, though it is making noticeable improvement.    Person(s) Educated Patient;Spouse   Methods Explanation;Demonstration   Comprehension Verbalized understanding;Returned demonstration          PT Short Term Goals - 08/18/16 1158      PT SHORT TERM GOAL #1   Title Pt will  increase knee extension ROM to -10 deg   Baseline -18 deg   Time 2   Period Weeks   Status New     PT SHORT TERM GOAL #2   Title Pt will increase knee extension ROM to 110 deg.   Baseline 40 deg   Time 2   Period Weeks           PT Long Term Goals - 08/18/16 1202      PT LONG TERM GOAL #1   Title Pt will walk an 18 hole golf course without pain to return to recreational activities.    Baseline unable to walk golf course   Time 6   Period Weeks   Status New     PT LONG TERM GOAL #2   Title Pt will walk 4-5 miles to return to recreational activities.    Baseline unable   Time 6   Period Weeks   Status New     PT LONG TERM GOAL #3    Title Pt will score a 25 on the FGA to demonstrate independence and safety with mobility.    Time 6   Period Weeks   Status New     PT LONG TERM GOAL #4   Title  Pt will reach R knee extension of 0 deg to demonstrate optimal ROM for long term pain reduction.    Baseline -18 deg   Time 6   Period Weeks   Status New     PT LONG TERM GOAL #5   Title Pt will reach R knee flexion of 110 deg. to complete functional tasks such as stair ascent/descent.    Baseline 40 deg   Time 6   Period Weeks   Status New               Plan - 08/28/16 2232    Clinical Impression Statement Patient initially antalgic, he reports he had some swelling after previous PT session. He has made significant improvement in knee flexion ROM after mobilizations this date, though significant atrophy of R quadricep is evident. Will continue to progress quadricep strengthening and knee flexion ROM in subsequent sessions.    Rehab Potential Excellent   PT Frequency 2x / week   PT Duration 6 weeks   PT Treatment/Interventions ADLs/[place] Care Home Management;Electrical Stimulation;Therapeutic exercise;Passive range of motion;Patient/family education   PT Next Visit Plan Pt will work on PROM as well as pain modulation using e-stim. Quad activation will also be addressed using e-stim.   PT Home Exercise Plan Pt was given an HEP to work on extension with towel under the heel  in supine.    Consulted and Agree with Plan of Care Patient;Family member/caregiver      Patient will benefit from skilled therapeutic intervention in order to improve the following deficits and impairments:  Abnormal gait, Pain, Decreased mobility, Decreased strength, Difficulty walking, Decreased range of motion, Increased edema  Visit Diagnosis: Chronic pain of right knee  Status post right knee replacement  Difficulty in walking, not elsewhere classified     Problem List Patient Active Problem List   Diagnosis Date Noted  . OA  (osteoarthritis) of knee 08/15/2016  . 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 Personal history of diseases of skin or subcutaneous tissue 07/30/2015  . Essential thrombocytosis ([place]) 07/30/2015    Royce Macadamia 08/28/2016, 10:33 PM  [facility] [address], [place], [address] Phone: [phone]  Fax:  [phone]  Name: Anthony [facility] MRN: [id number] Date of Birth: [birth year]

## 2016-08-30 ENCOUNTER — Ambulatory Visit: Payer: BLUE CROSS/BLUE SHIELD | Admitting: Physical Therapy

## 2016-08-30 DIAGNOSIS — M25561 Pain in right knee: Principal | ICD-10-CM

## 2016-08-30 DIAGNOSIS — Z96651 Presence of right artificial knee joint: Secondary | ICD-10-CM

## 2016-08-30 DIAGNOSIS — G8929 Other chronic pain: Secondary | ICD-10-CM

## 2016-08-30 DIAGNOSIS — R262 Difficulty in walking, not elsewhere classified: Secondary | ICD-10-CM

## 2016-08-30 NOTE — Therapy (Signed)
[facility] [address], [place], [address] Phone: [phone]   Fax:  [phone]  Physical Therapy Treatment  Patient Details  Name: Anthony Graves MRN: [id number] Date of Birth: March 31, 1961 No Data Recorded  Encounter Date: 08/30/2016      PT End of Session - 08/30/16 1236    Visit Number 4   Number of Visits 13   Date for PT Re-Evaluation 10/11/16   PT Start Time 0945   PT Stop Time 1030   PT Time Calculation (min) 45 min   Activity Tolerance Patient tolerated treatment well   Behavior During Therapy [facility] for tasks assessed/performed      Past Medical History:  Diagnosis Date  . Arthritis    osteoartiritis  . High platelet count ([place])     Past Surgical History:  Procedure Laterality Date  . COLONOSCOPY  2016  . CYSTECTOMY  years ago   pilonidal cyst removed  . KNEE SURGERY Right 20 years ago   arthrocscopy  . TOTAL KNEE ARTHROPLASTY Right 08/15/2016   Procedure: RIGHT TOTAL KNEE ARTHROPLASTY;  Surgeon: Gaynelle Arabian, MD;  Location: [facility];  Service: Orthopedics;  Laterality: Right;    There were no vitals filed for this visit.      Subjective Assessment - 08/30/16 1234    Subjective Patient reports he is experiencing less pain, and reports feeling improved quadricep contraction. He has been performing his HEP x1 per day as he continues to get swelling around the joint afterwards.    Patient is accompained by: Family member   Limitations Lifting;Walking;Standing;House hold activities   Patient Stated Goals Pt reports he would like to return to tennis, golf, and 4-5 mile walks.   Currently in Pain? --  Reports pain in R knee as mild to moderate to begin the session.    Pain Location Knee   Pain Orientation Right   Pain Descriptors / Indicators Aching;Operative site guarding   Pain Type Surgical pain   Pain Onset 1 to 4 weeks ago   Pain Frequency Intermittent     Initial knee flexion  to 62 degrees   Seated A-P mobilizations grade III x 8 bouts x 15-30" to talus/distal tibia to increase knee flexion ROM  Supine A-P mobilizations x 10 bouts x 15-30" at proximal tibia in knee flexion   After performance of knee mobilizations, patient able to achieve 71-72 degrees of knee flexion in supine   Gait pattern initially-- noted to circumduct his RLE due to poor DF/knee flexion for toe off. With cuing for increasing knee flexion and stride length modification, improved toe off noted.   Standing TKEs with red t-band x 20, with green t-band x 15 on RLE and E-stim applied for pain control at 14.5 mA continuous   Mini squats with e-stim applied to distal quadricep for activation/pain control at 14.5 mA continuous 3 sets x 12 repetitions to 45-50 degrees of knee flexion. Initially felt some discomfort in medial knee, performed DF stretching manually after which he reported no pain.                             PT Education - 08/30/16 1235    Education provided Yes   Education Details He is increasing knee flexion ROM progressively in each session, mini squats and TKEs to address quad deficits.    Person(s) Educated Patient;Spouse   Methods Explanation;Demonstration;Handout   Comprehension  Returned demonstration;Verbalized understanding          PT Short Term Goals - 08/18/16 1158      PT SHORT TERM GOAL #1   Title Pt will increase knee extension ROM to -10 deg   Baseline -18 deg   Time 2   Period Weeks   Status New     PT SHORT TERM GOAL #2   Title Pt will increase knee extension ROM to 110 deg.   Baseline 40 deg   Time 2   Period Weeks           PT Long Term Goals - 08/18/16 1202      PT LONG TERM GOAL #1   Title Pt will walk an 18 hole golf course without pain to return to recreational activities.    Baseline unable to walk golf course   Time 6   Period Weeks   Status New     PT LONG TERM GOAL #2   Title Pt will walk 4-5 miles to  return to recreational activities.    Baseline unable   Time 6   Period Weeks   Status New     PT LONG TERM GOAL #3   Title Pt will score a 25 on the FGA to demonstrate independence and safety with mobility.    Time 6   Period Weeks   Status New     PT LONG TERM GOAL #4   Title  Pt will reach R knee extension of 0 deg to demonstrate optimal ROM for long term pain reduction.    Baseline -18 deg   Time 6   Period Weeks   Status New     PT LONG TERM GOAL #5   Title Pt will reach R knee flexion of 110 deg. to complete functional tasks such as stair ascent/descent.    Baseline 40 deg   Time 6   Period Weeks   Status New               Plan - 08/30/16 1236    Clinical Impression Statement Patient demonstrates improved knee flexion this session to 73 degrees, he is able to tolerate standing quadricep activities without discomfort aside from mini squats until DF stretch applied. He is demonstrating normalizing gait pattern with crutches and increasing knee flexion, though toe off on RLE is still decreased.    Rehab Potential Excellent   PT Frequency 2x / week   PT Duration 6 weeks   PT Treatment/Interventions ADLs/[place] [facility] Home Management;Electrical Stimulation;Therapeutic exercise;Passive range of motion;Patient/family education   PT Next Visit Plan Pt will work on PROM as well as pain modulation using e-stim. Quad activation will also be addressed using e-stim.   PT Home Exercise Plan Mini squats, TKEs, and knee flexion mobilizations.    Consulted and Agree with Plan of Care Patient;Family member/caregiver      Patient will benefit from skilled therapeutic intervention in order to improve the following deficits and impairments:  Abnormal gait, Pain, Decreased mobility, Decreased strength, Difficulty walking, Decreased range of motion, Increased edema  Visit Diagnosis: Chronic pain of right knee  Difficulty in walking, not elsewhere classified  Status post right knee  replacement     Problem List Patient Active Problem List   Diagnosis Date Noted  . OA (osteoarthritis) of knee 08/15/2016  . 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 Personal history of diseases of skin or subcutaneous tissue 07/30/2015  . Essential thrombocytosis ([place]) 07/30/2015   Royce Macadamia  PT, DPT, CSCS    08/30/2016, 12:40 PM  [place] [facility] [facility] [address], [place], [address] Phone: [phone]   Fax:  [phone]  Name: Anthony Graves MRN: [id number] Date of Birth: September 15, 1960

## 2016-09-01 ENCOUNTER — Ambulatory Visit: Payer: BLUE CROSS/BLUE SHIELD | Admitting: Physical Therapy

## 2016-09-01 DIAGNOSIS — Z96651 Presence of right artificial knee joint: Secondary | ICD-10-CM

## 2016-09-01 DIAGNOSIS — M25561 Pain in right knee: Secondary | ICD-10-CM | POA: Diagnosis not present

## 2016-09-01 DIAGNOSIS — R262 Difficulty in walking, not elsewhere classified: Secondary | ICD-10-CM

## 2016-09-01 DIAGNOSIS — G8929 Other chronic pain: Secondary | ICD-10-CM

## 2016-09-01 NOTE — Therapy (Signed)
[facility] [address], [place], [address] Phone: [phone]   Fax:  [phone]  Physical Therapy Treatment  Patient Details  Name: Anthony Graves MRN: [id number] Date of Birth: [birth year] No Data Recorded  Encounter Date: 09/01/2016      PT End of Session - 09/01/16 1241    Visit Number 5   Number of Visits 13   Date for PT Re-Evaluation 10/11/16   PT Start Time 0945   PT Stop Time 1045   PT Time Calculation (min) 60 min   Activity Tolerance Patient tolerated treatment well   Behavior During Therapy [facility] for tasks assessed/performed      Past Medical History:  Diagnosis Date  . Arthritis    osteoartiritis  . High platelet count ([place])     Past Surgical History:  Procedure Laterality Date  . COLONOSCOPY  2016  . CYSTECTOMY  years ago   pilonidal cyst removed  . KNEE SURGERY Right 20 years ago   arthrocscopy  . TOTAL KNEE ARTHROPLASTY Right 08/15/2016   Procedure: RIGHT TOTAL KNEE ARTHROPLASTY;  Surgeon: Gaynelle Arabian, MD;  Location: [facility];  Service: Orthopedics;  Laterality: Right;    There were no vitals filed for this visit.      Subjective Assessment - 09/01/16 1233    Subjective Patient reports he had a follow up with his MD, his knee flexion ROM is lagging, but otherwise his follow up was positive. MD encouraged more aggressive knee flexion and use of pain relievers to assist with symptoms from knee flexion ROM.    Patient is accompained by: Family member   Limitations Lifting;Walking;Standing;House hold activities   Patient Stated Goals Pt reports he would like to return to tennis, golf, and 4-5 mile walks.   Currently in Pain? Yes   Pain Score --  Does not rate, does report pain is improving with time.   Pain Location Knee   Pain Orientation Right   Pain Descriptors / Indicators Aching;Operative site guarding;Sore   Pain Type Surgical pain   Pain Onset 1 to 4 weeks ago   Pain  Frequency Constant       R circumfrence at patella 43.5cm, L circumfrence 36cm   A-P mobilizations in sitting to distal tibia x 10 minutes with E-stim applied continuously to distal quadricep at 17.mA for pain control (painful, but patient understands aggressive mobilization noted)   A-P mobilizations in supine to proximal tibia 5 bouts x 30"  (more tolerable)  Knee flexion to 77 degrees by the completion of the session    Squats with HHA to 65 degrees of knee flexion x 10 for 2 sets (more tolerable than previous session)   Educated patient and wife about how to reduce effusion in knee, including use of Tubi grip (provided in this session 10 mmHg) to be used with compression stocking, elevating RLE above the heart, and massage proximal to distal.                           PT Education - 09/01/16 1240    Education provided Yes   Education Details Use Tubi grip, elevate leg, ice PRN for pain control.    Person(s) Educated Patient;Spouse   Methods Explanation;Demonstration;Handout   Comprehension Verbalized understanding;Returned demonstration          PT Short Term Goals - 08/18/16 1158      PT SHORT TERM GOAL #1  Title Pt will increase knee extension ROM to -10 deg   Baseline -18 deg   Time 2   Period Weeks   Status New     PT SHORT TERM GOAL #2   Title Pt will increase knee extension ROM to 110 deg.   Baseline 40 deg   Time 2   Period Weeks           PT Long Term Goals - 08/18/16 1202      PT LONG TERM GOAL #1   Title Pt will walk an 18 hole golf course without pain to return to recreational activities.    Baseline unable to walk golf course   Time 6   Period Weeks   Status New     PT LONG TERM GOAL #2   Title Pt will walk 4-5 miles to return to recreational activities.    Baseline unable   Time 6   Period Weeks   Status New     PT LONG TERM GOAL #3   Title Pt will score a 25 on the FGA to demonstrate independence and safety with  mobility.    Time 6   Period Weeks   Status New     PT LONG TERM GOAL #4   Title  Pt will reach R knee extension of 0 deg to demonstrate optimal ROM for long term pain reduction.    Baseline -18 deg   Time 6   Period Weeks   Status New     PT LONG TERM GOAL #5   Title Pt will reach R knee flexion of 110 deg. to complete functional tasks such as stair ascent/descent.    Baseline 40 deg   Time 6   Period Weeks   Status New               Plan - 09/01/16 1241    Clinical Impression Statement Patient continues to struggle with increasing knee flexion, during joint measurements in this session he has significant effusion in the R knee joint (43.5 cm to 36 cm) which is likely impeding his ROM. He is able to increase weightbearing through his RLE during gait, educated patient to use Tubi Grip and elevate his LE to reduce joint effusion.    Rehab Potential Excellent   PT Frequency 2x / week   PT Duration 6 weeks   PT Treatment/Interventions ADLs/[place] [facility] Home Management;Electrical Stimulation;Therapeutic exercise;Passive range of motion;Patient/family education   PT Next Visit Plan Pt will work on PROM as well as pain modulation using e-stim. Quad activation will also be addressed using e-stim.   PT Home Exercise Plan Mini squats, TKEs, and knee flexion mobilizations.    Consulted and Agree with Plan of Care Patient;Family member/caregiver      Patient will benefit from skilled therapeutic intervention in order to improve the following deficits and impairments:  Abnormal gait, Pain, Decreased mobility, Decreased strength, Difficulty walking, Decreased range of motion, Increased edema  Visit Diagnosis: Chronic pain of right knee  Difficulty in walking, not elsewhere classified  Status post right knee replacement     Problem List Patient Active Problem List   Diagnosis Date Noted  . OA (osteoarthritis) of knee 08/15/2016  . 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  Personal history of diseases of skin or subcutaneous tissue 07/30/2015  . Essential thrombocytosis ([place]) 07/30/2015    Royce Macadamia PT, DPT, CSCS    09/01/2016, 12:46 PM  [facility] [address] [place], [address] Phone: [phone]   Fax:  [phone]  Name: Garn Connerly Shults MRN: [id number] Date of Birth: [birth year]

## 2016-09-06 ENCOUNTER — Ambulatory Visit: Payer: BLUE CROSS/BLUE SHIELD | Admitting: Physical Therapy

## 2016-09-06 DIAGNOSIS — M25561 Pain in right knee: Secondary | ICD-10-CM | POA: Diagnosis not present

## 2016-09-06 DIAGNOSIS — G8929 Other chronic pain: Secondary | ICD-10-CM

## 2016-09-06 DIAGNOSIS — Z96651 Presence of right artificial knee joint: Secondary | ICD-10-CM

## 2016-09-06 DIAGNOSIS — R262 Difficulty in walking, not elsewhere classified: Secondary | ICD-10-CM

## 2016-09-06 NOTE — Therapy (Signed)
[facility] [address] [place], [address] Phone: [phone]   Fax:  [phone]  Physical Therapy Treatment  Patient Details  Name: Anthony Graves MRN: [id number] Date of Birth: 12/09/1960 No Data Recorded  Encounter Date: 09/06/2016      PT End of Session - 09/06/16 1246    Visit Number 6   Number of Visits 13   Date for PT Re-Evaluation 10/11/16   PT Start Time 0945   PT Stop Time 1030   PT Time Calculation (min) 45 min   Activity Tolerance Patient tolerated treatment well   Behavior During Therapy [facility] for tasks assessed/performed      Past Medical History:  Diagnosis Date  . Arthritis    osteoartiritis  . High platelet count ([place])     Past Surgical History:  Procedure Laterality Date  . COLONOSCOPY  2016  . CYSTECTOMY  years ago   pilonidal cyst removed  . KNEE SURGERY Right 20 years ago   arthrocscopy  . TOTAL KNEE ARTHROPLASTY Right 08/15/2016   Procedure: RIGHT TOTAL KNEE ARTHROPLASTY;  Surgeon: Gaynelle Arabian, MD;  Location: [facility];  Service: Orthopedics;  Laterality: Right;    There were no vitals filed for this visit.      Subjective Assessment - 09/06/16 1242    Subjective Patient reports he has progressively been working on flexion, elevating his leg over the weekend. He reports he feels a better contraction in his quadricep and improving tolerance for weightbearing and decreased pain over the past week or so.    Patient is accompained by: Family member   Limitations Lifting;Walking;Standing;House hold activities   Patient Stated Goals Pt reports he would like to return to tennis, golf, and 4-5 mile walks.   Currently in Pain? Yes  Patient reports more intense pain with knee flexion, walking is not near as painful as it was   Pain Type Surgical pain   Pain Onset 1 to 4 weeks ago     Initially ROM in supine measured at 77 degrees   Performed lunge on step with 2 risers x 2  minutes to roughly 81 degrees   Squats with bilateral HHA x 4 to 75 degrees   Long axis mobilizations to talus in sitting x10 minutes to roughly 81 degrees in sitting  Fibular head mobilizations x 2 minutes grade III (mild hypomobility)   Proximal tibia mobilizations in supine in end range knee flexion x 3 minutes (better tolerated than long axis stretching, though not able to go through same flexion excursion).   Observed gait with 1 crutch -- noted to have minimal lateral trunk flexion, increased weight bearing on RLE -- continues to lack terminal knee extension at heel strike initially, but with cuing to lock in heel at first contact notable increase in extension.   Knee flexion at the end of session measured between 81-83 degrees in supine. (provided STM to medial hamstrings tendon as notable muscle guarding was palpated).   Performed supine knee extension mobilizations with bolster underneath ankle x 2 minutes (still lacking 3-5 degrees of full extension).                             PT Education - 09/06/16 1245    Education provided Yes   Education Details Continue to elevate to get swelling down, continue use of Tubi grip and low load long duration to get knee extension  to full on R side.    Person(s) Educated Patient;Spouse   Methods Explanation;Demonstration;Handout   Comprehension Verbalized understanding;Returned demonstration          PT Short Term Goals - 08/18/16 1158      PT SHORT TERM GOAL #1   Title Pt will increase knee extension ROM to -10 deg   Baseline -18 deg   Time 2   Period Weeks   Status New     PT SHORT TERM GOAL #2   Title Pt will increase knee extension ROM to 110 deg.   Baseline 40 deg   Time 2   Period Weeks           PT Long Term Goals - 08/18/16 1202      PT LONG TERM GOAL #1   Title Pt will walk an 18 hole golf course without pain to return to recreational activities.    Baseline unable to walk golf course    Time 6   Period Weeks   Status New     PT LONG TERM GOAL #2   Title Pt will walk 4-5 miles to return to recreational activities.    Baseline unable   Time 6   Period Weeks   Status New     PT LONG TERM GOAL #3   Title Pt will score a 25 on the FGA to demonstrate independence and safety with mobility.    Time 6   Period Weeks   Status New     PT LONG TERM GOAL #4   Title  Pt will reach R knee extension of 0 deg to demonstrate optimal ROM for long term pain reduction.    Baseline -18 deg   Time 6   Period Weeks   Status New     PT LONG TERM GOAL #5   Title Pt will reach R knee flexion of 110 deg. to complete functional tasks such as stair ascent/descent.    Baseline 40 deg   Time 6   Period Weeks   Status New               Plan - 09/06/16 1246    Clinical Impression Statement Patient does have small reduction in joint line effusion (from 43.5cm to 43 cm) over the weekend, PT observed increased knee flexion ROM to 79-83 degrees of flexion this date. His strength, balance, and gait mechanics with 1 crutch were appropriate, thus educated to begin ambulating with 1 crutch for short distances. He is able to perform HEP independently. It appears his decreased flexion ROM is largely influenced by the degree of swelling he has, will continue to have patient elevate, ice, compress joint to decrease swelling.    Rehab Potential Excellent   PT Frequency 2x / week   PT Duration 6 weeks   PT Treatment/Interventions ADLs/[place] Care Home Management;Electrical Stimulation;Therapeutic exercise;Passive range of motion;Patient/family education   PT Next Visit Plan Pt will work on PROM as well as pain modulation using e-stim. Quad activation will also be addressed using e-stim.   PT Home Exercise Plan Mini squats, TKEs, and knee flexion mobilizations.    Consulted and Agree with Plan of Care Patient;Family member/caregiver      Patient will benefit from skilled therapeutic intervention  in order to improve the following deficits and impairments:  Abnormal gait, Pain, Decreased mobility, Decreased strength, Difficulty walking, Decreased range of motion, Increased edema  Visit Diagnosis: Chronic pain of right knee  Difficulty in walking, not elsewhere classified  Status post right knee replacement     Problem List Patient Active Problem List   Diagnosis Date Noted  . OA (osteoarthritis) of knee 08/15/2016  . 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 Personal history of diseases of skin or subcutaneous tissue 07/30/2015  . Essential thrombocytosis ([place]) 07/30/2015   Royce Macadamia PT, DPT, CSCS    09/06/2016, 12:49 PM  [facility] [address] [place], [address] Phone: [phone]   Fax:  [phone]  Name: Anthony Graves MRN: [id number] Date of Birth: 1961/01/18

## 2016-09-08 ENCOUNTER — Ambulatory Visit: Payer: BLUE CROSS/BLUE SHIELD | Admitting: Physical Therapy

## 2016-09-08 DIAGNOSIS — G8929 Other chronic pain: Secondary | ICD-10-CM

## 2016-09-08 DIAGNOSIS — R262 Difficulty in walking, not elsewhere classified: Secondary | ICD-10-CM

## 2016-09-08 DIAGNOSIS — M25561 Pain in right knee: Principal | ICD-10-CM

## 2016-09-08 DIAGNOSIS — Z96651 Presence of right artificial knee joint: Secondary | ICD-10-CM

## 2016-09-08 NOTE — Therapy (Signed)
[facility] [address], [place], [address] Phone: [phone]   Fax:  [phone]  Physical Therapy Treatment  Patient Details  Name: Anthony Graves MRN: [id number] Date of Birth: 15-Sep-1960 No Data Recorded  Encounter Date: 09/08/2016      PT End of Session - 09/08/16 1245    Visit Number 7   Number of Visits 13   Date for PT Re-Evaluation 10/11/16   PT Start Time 0945   PT Stop Time 1030   PT Time Calculation (min) 45 min   Activity Tolerance Patient tolerated treatment well   Behavior During Therapy [facility] for tasks assessed/performed      Past Medical History:  Diagnosis Date  . Arthritis    osteoartiritis  . High platelet count ([place])     Past Surgical History:  Procedure Laterality Date  . COLONOSCOPY  2016  . CYSTECTOMY  years ago   pilonidal cyst removed  . KNEE SURGERY Right 20 years ago   arthrocscopy  . TOTAL KNEE ARTHROPLASTY Right 08/15/2016   Procedure: RIGHT TOTAL KNEE ARTHROPLASTY;  Surgeon: Gaynelle Arabian, MD;  Location: [facility];  Service: Orthopedics;  Laterality: Right;    There were no vitals filed for this visit.      Subjective Assessment - 09/08/16 0949    Subjective Patient reports he thinks his swelling has gone down and believes his ROM will be better today.    Patient is accompained by: Family member   Limitations Lifting;Walking;Standing;House hold activities   Patient Stated Goals Pt reports he would like to return to tennis, golf, and 4-5 mile walks.   Currently in Pain? --  Mild to moderate pain, increasing with knee flexion/extension overpressure.    Pain Onset --      Initial measurement 81-83 degrees in sitting   Performed oscillations in sitting to increase knee flexion A-P grade IV at distal tibia to tolerance x 10 minutes   Performed squats at TM with bilateral HHA x 6 to 81-83 degrees   Measured circumfrence around R knee - 42 cm on this date.    2nd step lunge stretch for R knee x 3 minutes to 81-83 degrees   Performed STM to distal hamstrings tendons due to notable guarding/spasm while ranging  Repeated bout of range oscillations in sitting A-P at distal tibia, able to range to 89-91 degrees.   Performed knee extension mobilizations in supine with towel roll underneath R ankle x 3 minutes.                            PT Education - 09/08/16 1245    Education provided Yes   Education Details Continue to get swelling out of knee, this has helped with increasing his ROM.    Person(s) Educated Patient;Spouse   Methods Explanation;Demonstration   Comprehension Verbalized understanding;Returned demonstration          PT Short Term Goals - 08/18/16 1158      PT SHORT TERM GOAL #1   Title Pt will increase knee extension ROM to -10 deg   Baseline -18 deg   Time 2   Period Weeks   Status New     PT SHORT TERM GOAL #2   Title Pt will increase knee extension ROM to 110 deg.   Baseline 40 deg   Time 2   Period Weeks  PT Long Term Goals - 08/18/16 1202      PT LONG TERM GOAL #1   Title Pt will walk an 18 hole golf course without pain to return to recreational activities.    Baseline unable to walk golf course   Time 6   Period Weeks   Status New     PT LONG TERM GOAL #2   Title Pt will walk 4-5 miles to return to recreational activities.    Baseline unable   Time 6   Period Weeks   Status New     PT LONG TERM GOAL #3   Title Pt will score a 25 on the FGA to demonstrate independence and safety with mobility.    Time 6   Period Weeks   Status New     PT LONG TERM GOAL #4   Title  Pt will reach R knee extension of 0 deg to demonstrate optimal ROM for long term pain reduction.    Baseline -18 deg   Time 6   Period Weeks   Status New     PT LONG TERM GOAL #5   Title Pt will reach R knee flexion of 110 deg. to complete functional tasks such as stair ascent/descent.     Baseline 40 deg   Time 6   Period Weeks   Status New               Plan - 09/08/16 1246    Clinical Impression Statement Patient is able to achieve 91 degrees of knee flexion by the end of this session. His knee circumfrence is down to 42 cm on this date, which is likely involved in increasing his ROM. He is making good progress with ROM and weightbearing on RLE.    Rehab Potential Excellent   PT Frequency 2x / week   PT Duration 6 weeks   PT Treatment/Interventions ADLs/[place] [facility] Home Management;Electrical Stimulation;Therapeutic exercise;Passive range of motion;Patient/family education   PT Next Visit Plan Pt will work on PROM as well as pain modulation using e-stim. Quad activation will also be addressed using e-stim.   PT Home Exercise Plan Mini squats, TKEs, and knee flexion mobilizations.    Consulted and Agree with Plan of Care Patient;Family member/caregiver      Patient will benefit from skilled therapeutic intervention in order to improve the following deficits and impairments:  Abnormal gait, Pain, Decreased mobility, Decreased strength, Difficulty walking, Decreased range of motion, Increased edema  Visit Diagnosis: Chronic pain of right knee  Difficulty in walking, not elsewhere classified  Status post right knee replacement     Problem List Patient Active Problem List   Diagnosis Date Noted  . OA (osteoarthritis) of knee 08/15/2016  . 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 Personal history of diseases of skin or subcutaneous tissue 07/30/2015  . Essential thrombocytosis ([place]) 07/30/2015   Royce Macadamia PT, DPT, CSCS    09/08/2016, 12:49 PM  [facility] [address], [place], [address] Phone: [phone]   Fax:  [phone]  Name: Anthony Graves MRN: [id number] Date of Birth: 1960-09-03

## 2016-09-09 ENCOUNTER — Ambulatory Visit: Payer: BLUE CROSS/BLUE SHIELD | Admitting: Physical Therapy

## 2016-09-09 DIAGNOSIS — M25561 Pain in right knee: Secondary | ICD-10-CM | POA: Diagnosis not present

## 2016-09-09 DIAGNOSIS — Z96651 Presence of right artificial knee joint: Secondary | ICD-10-CM

## 2016-09-09 DIAGNOSIS — R262 Difficulty in walking, not elsewhere classified: Secondary | ICD-10-CM

## 2016-09-09 DIAGNOSIS — G8929 Other chronic pain: Secondary | ICD-10-CM

## 2016-09-09 NOTE — Therapy (Signed)
[facility] [address] [place], [address] Phone: [phone]   Fax:  [phone]  Physical Therapy Treatment  Patient Details  Name: Anthony Graves MRN: [id number] Date of Birth: 06-23-1961 No Data Recorded  Encounter Date: 09/09/2016      PT End of Session - 09/09/16 1114    Visit Number 8   Number of Visits 13   Date for PT Re-Evaluation 10/11/16   PT Start Time 0921   PT Stop Time 0945   PT Time Calculation (min) 24 min   Activity Tolerance Patient tolerated treatment well   Behavior During Therapy [facility] for tasks assessed/performed      Past Medical History:  Diagnosis Date  . Arthritis    osteoartiritis  . High platelet count ([place])     Past Surgical History:  Procedure Laterality Date  . COLONOSCOPY  2016  . CYSTECTOMY  years ago   pilonidal cyst removed  . KNEE SURGERY Right 20 years ago   arthrocscopy  . TOTAL KNEE ARTHROPLASTY Right 08/15/2016   Procedure: RIGHT TOTAL KNEE ARTHROPLASTY;  Surgeon: Gaynelle Arabian, MD;  [facility];  Service: Orthopedics;  Laterality: Right;    There were no vitals filed for this visit.      Subjective Assessment - 09/09/16 1109    Subjective Patient reports he may have overdone it with stretching session after PT visit yesterday, he feels more at baseline today. Mild increase in pain today.   Patient is accompained by: Family member   Limitations Lifting;Walking;Standing;House hold activities   Patient Stated Goals Pt reports he would like to return to tennis, golf, and 4-5 mile walks.   Currently in Pain? Yes  Patient has mild pain, much improved from initial sessions, increases with knee flexion overpressure.       Measured circumfrence around R knee - 42.1 cm, so slight increase from previous date   Performed 1st step lunge stretch as tolerated x 10 for 2 sets holds for tolerance   PT overpressure into knee flexion in sitting at edge of  bed x 10 minutes for "low load long duration stretching" with oscillations performed at end range for pain control. Able to achieve max of 94-95 degrees of flexion. Performed supine joint mobilizations with contract relax with therapist arm underneath popliteal fossa for different angle of overpressure x 3 minutes   Standing squats with TM assistance - to roughly 75-80 degrees, though weight shifting from RLE to LLE noted.                            PT Education - 09/09/16 1113    Education provided Yes   Education Details Elevate LE over the weekend, continue with stretching. ROM to 94-95 on this date.    Person(s) Educated Patient;Spouse   Methods Explanation;Demonstration   Comprehension Verbalized understanding;Returned demonstration          PT Short Term Goals - 08/18/16 1158      PT SHORT TERM GOAL #1   Title Pt will increase knee extension ROM to -10 deg   Baseline -18 deg   Time 2   Period Weeks   Status New     PT SHORT TERM GOAL #2   Title Pt will increase knee extension ROM to 110 deg.   Baseline 40 deg   Time 2   Period Weeks  PT Long Term Goals - 08/18/16 1202      PT LONG TERM GOAL #1   Title Pt will walk an 18 hole golf course without pain to return to recreational activities.    Baseline unable to walk golf course   Time 6   Period Weeks   Status New     PT LONG TERM GOAL #2   Title Pt will walk 4-5 miles to return to recreational activities.    Baseline unable   Time 6   Period Weeks   Status New     PT LONG TERM GOAL #3   Title Pt will score a 25 on the FGA to demonstrate independence and safety with mobility.    Time 6   Period Weeks   Status New     PT LONG TERM GOAL #4   Title  Pt will reach R knee extension of 0 deg to demonstrate optimal ROM for long term pain reduction.    Baseline -18 deg   Time 6   Period Weeks   Status New     PT LONG TERM GOAL #5   Title Pt will reach R knee flexion of 110  deg. to complete functional tasks such as stair ascent/descent.    Baseline 40 deg   Time 6   Period Weeks   Status New               Plan - 09/09/16 1114    Clinical Impression Statement Patient with very minimal increase in swelling from yesterday (up to 42.1 cm) ROM to 94-95 degrees with overpressure in sitting. He continues to demonstrate increased ROM progressively with each session and reports decreased pain throughout session. He is likely appropriate to be weaned off remaining crutch next week.    Rehab Potential Excellent   PT Frequency 2x / week   PT Duration 6 weeks   PT Treatment/Interventions ADLs/[place] Care Home Management;Electrical Stimulation;Therapeutic exercise;Passive range of motion;Patient/family education   PT Next Visit Plan Pt will work on PROM as well as pain modulation using e-stim. Quad activation will also be addressed using e-stim.   PT Home Exercise Plan Mini squats, TKEs, and knee flexion mobilizations.    Consulted and Agree with Plan of Care Patient;Family member/caregiver      Patient will benefit from skilled therapeutic intervention in order to improve the following deficits and impairments:  Abnormal gait, Pain, Decreased mobility, Decreased strength, Difficulty walking, Decreased range of motion, Increased edema  Visit Diagnosis: Chronic pain of right knee  Difficulty in walking, not elsewhere classified  Status post right knee replacement     Problem List Patient Active Problem List   Diagnosis Date Noted  . OA (osteoarthritis) of knee 08/15/2016  . 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 Personal history of diseases of skin or subcutaneous tissue 07/30/2015  . Essential thrombocytosis ([place]) 07/30/2015   Royce Macadamia PT, DPT, CSCS    09/09/2016, 11:16 AM  [facility] [address] [place], [address] Phone: [phone]   Fax:  [phone]  Name:  Anthony Graves MRN: [id number] Date of Birth: 1961/03/21

## 2016-09-13 ENCOUNTER — Ambulatory Visit: Payer: BLUE CROSS/BLUE SHIELD | Admitting: Physical Therapy

## 2016-09-13 DIAGNOSIS — R262 Difficulty in walking, not elsewhere classified: Secondary | ICD-10-CM

## 2016-09-13 DIAGNOSIS — Z96651 Presence of right artificial knee joint: Secondary | ICD-10-CM

## 2016-09-13 DIAGNOSIS — M25561 Pain in right knee: Principal | ICD-10-CM

## 2016-09-13 DIAGNOSIS — G8929 Other chronic pain: Secondary | ICD-10-CM

## 2016-09-13 NOTE — Therapy (Signed)
[facility] [address] [place], [address] Phone: [phone]   Fax:  [phone]  Physical Therapy Treatment  Patient Details  Name: Anthony Graves MRN: [id number] Date of Birth: 09-Jul-1961 No Data Recorded  Encounter Date: 09/13/2016      PT End of Session - 09/13/16 1445    Visit Number 9   Number of Visits 13   Date for PT Re-Evaluation 10/11/16   PT Start Time 0940   PT Stop Time 1025   PT Time Calculation (min) 45 min   Activity Tolerance Patient tolerated treatment well   Behavior During Therapy [facility] for tasks assessed/performed      Past Medical History:  Diagnosis Date  . Arthritis    osteoartiritis  . High platelet count ([place])     Past Surgical History:  Procedure Laterality Date  . COLONOSCOPY  2016  . CYSTECTOMY  years ago   pilonidal cyst removed  . KNEE SURGERY Right 20 years ago   arthrocscopy  . TOTAL KNEE ARTHROPLASTY Right 08/15/2016   Procedure: RIGHT TOTAL KNEE ARTHROPLASTY;  Surgeon: Gaynelle Arabian, MD;  [facility];  Service: Orthopedics;  Laterality: Right;    There were no vitals filed for this visit.      Subjective Assessment - 09/13/16 1443    Subjective Patient reports his swelling has gone down, he has tried walking some without the crutch over the weekend and continues to work on stretching as able/tolerated.    Patient is accompained by: Family member   Limitations Lifting;Walking;Standing;House hold activities   Patient Stated Goals Pt reports he would like to return to tennis, golf, and 4-5 mile walks.   Currently in Pain? Yes  Mild pain, increases with knee flexion mobilizations/stretching, mostly on medial portion of tibial plateau.    Pain Descriptors / Indicators Aching;Operative site guarding   Pain Type Surgical pain   Pain Onset More than a month ago   Pain Frequency Intermittent      Knee flexion (contract relax in supine and sitting) to 95  -- multiple bouts of seated knee flexion - A/P mobilizations at distal tibia, performed with contract-relax, switched to prone Ely's position x 4 bouts x 30", switched to supine with therapist arm underneath knee with A-P overpressure to distal tibia and performed contract relax to allow for increased ROM  Gait without device -- initially noted to have antalgic pattern and reduced toe off on his LLE causing him to stumble/lose balance. Cued to increase his L toe off, increase stride length which increased his R stance time, decreased lateral sway and reduced instances of L toe dragging/catching.   Performed overpressure into extension with pressure from distal femur, proximal tibia x 30" x 3 bouts (able to achieve ~2-5 degrees of knee extension)                            PT Education - 09/13/16 1444    Education provided Yes   Education Details Swelling has decreased, ROM continues to be easier to elicit though ROM same this time as last time.    Person(s) Educated Patient;Spouse   Methods Explanation;Demonstration   Comprehension Verbalized understanding;Returned demonstration          PT Short Term Goals - 08/18/16 1158      PT SHORT TERM GOAL #1   Title Pt will increase knee extension ROM to -10 deg  Baseline -18 deg   Time 2   Period Weeks   Status New     PT SHORT TERM GOAL #2   Title Pt will increase knee extension ROM to 110 deg.   Baseline 40 deg   Time 2   Period Weeks           PT Long Term Goals - 08/18/16 1202      PT LONG TERM GOAL #1   Title Pt will walk an 18 hole golf course without pain to return to recreational activities.    Baseline unable to walk golf course   Time 6   Period Weeks   Status New     PT LONG TERM GOAL #2   Title Pt will walk 4-5 miles to return to recreational activities.    Baseline unable   Time 6   Period Weeks   Status New     PT LONG TERM GOAL #3   Title Pt will score a 25 on the FGA to demonstrate  independence and safety with mobility.    Time 6   Period Weeks   Status New     PT LONG TERM GOAL #4   Title  Pt will reach R knee extension of 0 deg to demonstrate optimal ROM for long term pain reduction.    Baseline -18 deg   Time 6   Period Weeks   Status New     PT LONG TERM GOAL #5   Title Pt will reach R knee flexion of 110 deg. to complete functional tasks such as stair ascent/descent.    Baseline 40 deg   Time 6   Period Weeks   Status New               Plan - 09/13/16 1445    Clinical Impression Statement Patient demonstrates significant improvement in swelling (down from 42.1cm to 40.1cm at knee circumfrence). He is able to achieve full extension on the table, though still lacking some extension at heel strike in gait. Cued to increase toe off on LLE as he began to drag at times. He is progressing well with strength, though continues to be limited with end range knee flexion.    Rehab Potential Excellent   PT Frequency 2x / week   PT Duration 6 weeks   PT Treatment/Interventions ADLs/[place] Care Home Management;Electrical Stimulation;Therapeutic exercise;Passive range of motion;Patient/family education   PT Next Visit Plan Pt will work on PROM as well as pain modulation using e-stim. Quad activation will also be addressed using e-stim.   PT Home Exercise Plan Mini squats, TKEs, and knee flexion mobilizations.    Consulted and Agree with Plan of Care Patient;Family member/caregiver      Patient will benefit from skilled therapeutic intervention in order to improve the following deficits and impairments:  Abnormal gait, Pain, Decreased mobility, Decreased strength, Difficulty walking, Decreased range of motion, Increased edema  Visit Diagnosis: Chronic pain of right knee  Difficulty in walking, not elsewhere classified  Status post right knee replacement     Problem List Patient Active Problem List   Diagnosis Date Noted  . OA (osteoarthritis) of knee  08/15/2016  . 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 Personal history of diseases of skin or subcutaneous tissue 07/30/2015  . Essential thrombocytosis ([place]) 07/30/2015   Royce Macadamia PT, DPT, CSCS    09/13/2016, 2:48 PM  [facility] [address], [place], [address]  Phone: [phone]   Fax:  [phone]  Name: Marshun Fulmore Peretti MRN: [id number] Date of Birth: 03-03-1961

## 2016-09-15 ENCOUNTER — Ambulatory Visit: Payer: BLUE CROSS/BLUE SHIELD | Attending: Orthopedic Surgery | Admitting: Physical Therapy

## 2016-09-15 DIAGNOSIS — Z96651 Presence of right artificial knee joint: Secondary | ICD-10-CM | POA: Insufficient documentation

## 2016-09-15 DIAGNOSIS — G8929 Other chronic pain: Secondary | ICD-10-CM | POA: Insufficient documentation

## 2016-09-15 DIAGNOSIS — M25561 Pain in right knee: Secondary | ICD-10-CM | POA: Diagnosis present

## 2016-09-15 DIAGNOSIS — R262 Difficulty in walking, not elsewhere classified: Secondary | ICD-10-CM | POA: Diagnosis present

## 2016-09-15 NOTE — Patient Instructions (Addendum)
Measued swelling around knee ranging from 40.5cm to 41cm  2nd step lunge stretch - to 95 degrees in weightbearing   A-P mobilizations -- to 101 degrees based on primary therapist measure, 96 degrees by SPT.  Standing hip abductions x 12 with red t-band (too easy, progressed to red and green t-band - x 10 for 2 sets bilaterally)   Standing hip extensions x 10 for 2 sets with red t-band and green t-band   Leg press with bilateral LEs - 65# x12 85# for 2nd set

## 2016-09-15 NOTE — Therapy (Signed)
[facility] [address], [place], [address] Phone: [phone]   Fax:  [phone]  Physical Therapy Treatment  Patient Details  Name: Anthony Graves MRN: [id number] Date of Birth: 05/19/1961 No Data Recorded  Encounter Date: 09/15/2016      PT End of Session - 09/15/16 0919    Visit Number 10   Number of Visits 13   Date for PT Re-Evaluation 10/11/16   PT Start Time 0900   PT Stop Time 0945   PT Time Calculation (min) 45 min   Activity Tolerance Patient tolerated treatment well   Behavior During Therapy [facility] for tasks assessed/performed      Past Medical History:  Diagnosis Date  . Arthritis    osteoartiritis  . High platelet count ([place])     Past Surgical History:  Procedure Laterality Date  . COLONOSCOPY  2016  . CYSTECTOMY  years ago   pilonidal cyst removed  . KNEE SURGERY Right 20 years ago   arthrocscopy  . TOTAL KNEE ARTHROPLASTY Right 08/15/2016   Procedure: RIGHT TOTAL KNEE ARTHROPLASTY;  Surgeon: Gaynelle Arabian, MD;  [facility];  Service: Orthopedics;  Laterality: Right;    There were no vitals filed for this visit.      Subjective Assessment - 09/15/16 0856    Subjective Patient presents using 1 crutch this date, reports he is feeling pretty good.    Patient is accompained by: Family member   Limitations Lifting;Walking;Standing;House hold activities   Patient Stated Goals Pt reports he would like to return to tennis, golf, and 4-5 mile walks.   Currently in Pain? No/denies      Measued swelling around knee ranging from 40.5cm to 41cm  2nd step lunge stretch - to 95 degrees in weightbearing   A-P mobilizations -- to 101 degrees based on primary therapist measure, 96 degrees by SPT.  Standing hip abductions x 12 with red t-band (too easy, progressed to red and green t-band - x 10 for 2 sets bilaterally)   Standing hip extensions x 10 for 2 sets with red t-band and green  t-band   Leg press with bilateral LEs - 65# x12 85# for 2nd set for 10 repetitions   Prone Ely's position stretching x 10 repetitions (to 88-90 degrees)   Step ups to 1 riser x 10 for 2 sets with HHA for quad activation.                             PT Education - 09/15/16 [phone]    Education provided Yes   Education Details HEP for thigh and hip strengthening to help normalize gait pattern   Person(s) Educated Patient;Spouse   Methods Explanation;Demonstration;Handout   Comprehension Verbalized understanding;Returned demonstration          PT Short Term Goals - 08/18/16 1158      PT SHORT TERM GOAL #1   Title Pt will increase knee extension ROM to -10 deg   Baseline -18 deg   Time 2   Period Weeks   Status New     PT SHORT TERM GOAL #2   Title Pt will increase knee extension ROM to 110 deg.   Baseline 40 deg   Time 2   Period Weeks           PT Long Term Goals - 08/18/16 1202      PT LONG TERM GOAL #1  Title Pt will walk an 18 hole golf course without pain to return to recreational activities.    Baseline unable to walk golf course   Time 6   Period Weeks   Status New     PT LONG TERM GOAL #2   Title Pt will walk 4-5 miles to return to recreational activities.    Baseline unable   Time 6   Period Weeks   Status New     PT LONG TERM GOAL #3   Title Pt will score a 25 on the FGA to demonstrate independence and safety with mobility.    Time 6   Period Weeks   Status New     PT LONG TERM GOAL #4   Title  Pt will reach R knee extension of 0 deg to demonstrate optimal ROM for long term pain reduction.    Baseline -18 deg   Time 6   Period Weeks   Status New     PT LONG TERM GOAL #5   Title Pt will reach R knee flexion of 110 deg. to complete functional tasks such as stair ascent/descent.    Baseline 40 deg   Time 6   Period Weeks   Status New               Plan - 09/15/16 0919    Clinical Impression Statement  Patient continues to improve with knee flexion (up to 101 on primary therapists measure) and 95 degrees in weight bearing on 2nd step lunge position initially. In gait without device, continued to lack full knee extension and laterally flexes trunk as compensatory patterns for weakness in knee extensors, posterior hip musculature which is being addressed with resistance exercises targeting both.    Rehab Potential Excellent   PT Frequency 2x / week   PT Duration 6 weeks   PT Treatment/Interventions ADLs/[place] Care Home Management;Electrical Stimulation;Therapeutic exercise;Passive range of motion;Patient/family education   PT Next Visit Plan Pt will work on PROM as well as pain modulation using e-stim. Quad activation will also be addressed using e-stim.   PT Home Exercise Plan Mini squats, TKEs, and knee flexion mobilizations.    Consulted and Agree with Plan of Care Patient;Family member/caregiver      Patient will benefit from skilled therapeutic intervention in order to improve the following deficits and impairments:  Abnormal gait, Pain, Decreased mobility, Decreased strength, Difficulty walking, Decreased range of motion, Increased edema  Visit Diagnosis: Chronic pain of right knee  Difficulty in walking, not elsewhere classified  Status post right knee replacement     Problem List Patient Active Problem List   Diagnosis Date Noted  . OA (osteoarthritis) of knee 08/15/2016  . 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 Personal history of diseases of skin or subcutaneous tissue 07/30/2015  . Essential thrombocytosis ([place]) 07/30/2015    Royce Macadamia PT, DPT, CSCS    09/15/2016, 9:41 AM  [facility] [address] [place], [address] Phone: [phone]   Fax:  [phone]  Name: Anthony Graves MRN: [id number] Date of Birth: [birth year]

## 2016-09-19 ENCOUNTER — Ambulatory Visit: Payer: BLUE CROSS/BLUE SHIELD | Admitting: Physical Therapy

## 2016-09-19 DIAGNOSIS — M25561 Pain in right knee: Secondary | ICD-10-CM | POA: Diagnosis not present

## 2016-09-19 DIAGNOSIS — G8929 Other chronic pain: Secondary | ICD-10-CM

## 2016-09-19 DIAGNOSIS — R262 Difficulty in walking, not elsewhere classified: Secondary | ICD-10-CM

## 2016-09-19 DIAGNOSIS — Z96651 Presence of right artificial knee joint: Secondary | ICD-10-CM

## 2016-09-19 NOTE — Patient Instructions (Signed)
42.0cm at joint line in knee flexed position  NuStep x 3 minutes  Squats   2nd step lunge  A-P long sitting stretch -- to 100-103 degrees   Ely's stretch position    Leg Press -- 75# x 10 (bilateral LE) - 1 hole open between pin and seat for 2 sets   LAQs on OMEGA - 25# x 8 repetitions with bilateral LEs, performed x8 with 10# on RLE for 2 sets

## 2016-09-20 NOTE — Therapy (Signed)
[facility] [address] [place], [address] Phone: [phone]   Fax:  [phone]  Physical Therapy Treatment  Patient Details  Name: Anthony Graves MRN: [id number] Date of Birth: Jun 03, 1961 No Data Recorded  Encounter Date: 09/19/2016      PT End of Session - 09/20/16 1133    Visit Number 11   Number of Visits 13   Date for PT Re-Evaluation 10/11/16   PT Start Time 0950   PT Stop Time 1030   PT Time Calculation (min) 40 min   Activity Tolerance Patient tolerated treatment well   Behavior During Therapy [facility] for tasks assessed/performed      Past Medical History:  Diagnosis Date  . Arthritis    osteoartiritis  . High platelet count ([place])     Past Surgical History:  Procedure Laterality Date  . COLONOSCOPY  2016  . CYSTECTOMY  years ago   pilonidal cyst removed  . KNEE SURGERY Right 20 years ago   arthrocscopy  . TOTAL KNEE ARTHROPLASTY Right 08/15/2016   Procedure: RIGHT TOTAL KNEE ARTHROPLASTY;  Surgeon: Gaynelle Arabian, MD;  Location: [facility];  Service: Orthopedics;  Laterality: Right;    There were no vitals filed for this visit.      Subjective Assessment - 09/19/16 0948    Subjective Patient reports tightness increased over the weekend, will be seeing Dr. Maureen Ralphs tomorrow for follow up.    Patient is accompained by: Family member   Limitations Lifting;Walking;Standing;House hold activities   Patient Stated Goals Pt reports he would like to return to tennis, golf, and 4-5 mile walks.   Currently in Pain? No/denies      42.0cm at joint line in knee flexed position  NuStep x 3 minutes (for warm up to increase knee flexion)- prior to session, unbilled  Squats at TM x 8 (improved ROM - up to 100 degrees of knee flexion with weightbearing)  2nd step lunge x 3 minutes to roughly 100 degrees  A-P long sitting stretch x 8 minutes to tolerance/end range, well tolerated but requires breaks for  pain/stiffness relief. -- to 100-103 degrees   Ely's stretch position -- to roughly 90 degrees with overpressure x 2 minutes (tolerated better than sitting stretch)   Leg Press -- 75# x 10 (bilateral LE) - 1 hole open between pin and seat for 2 sets   LAQs on OMEGA - 25# x 8 repetitions with bilateral LEs, performed x8 with 10# on RLE for 2 sets                            PT Education - 09/20/16 1133    Education provided Yes   Education Details Will likely need to continue to work on ROM, may focus on quad strength to allow for ambulation without crutch and minimzing swelling around knee.    Person(s) Educated Patient;Spouse   Methods Explanation;Demonstration   Comprehension Verbalized understanding;Returned demonstration          PT Short Term Goals - 08/18/16 1158      PT SHORT TERM GOAL #1   Title Pt will increase knee extension ROM to -10 deg   Baseline -18 deg   Time 2   Period Weeks   Status New     PT SHORT TERM GOAL #2   Title Pt will increase knee extension ROM to 110 deg.   Baseline 40 deg  Time 2   Period Weeks           PT Long Term Goals - 08/18/16 1202      PT LONG TERM GOAL #1   Title Pt will walk an 18 hole golf course without pain to return to recreational activities.    Baseline unable to walk golf course   Time 6   Period Weeks   Status New     PT LONG TERM GOAL #2   Title Pt will walk 4-5 miles to return to recreational activities.    Baseline unable   Time 6   Period Weeks   Status New     PT LONG TERM GOAL #3   Title Pt will score a 25 on the FGA to demonstrate independence and safety with mobility.    Time 6   Period Weeks   Status New     PT LONG TERM GOAL #4   Title  Pt will reach R knee extension of 0 deg to demonstrate optimal ROM for long term pain reduction.    Baseline -18 deg   Time 6   Period Weeks   Status New     PT LONG TERM GOAL #5   Title Pt will reach R knee flexion of 110 deg. to  complete functional tasks such as stair ascent/descent.    Baseline 40 deg   Time 6   Period Weeks   Status New               Plan - 09/20/16 1134    Clinical Impression Statement Patient continuing to have small improvements in knee flexion (up to 95-100 in weightbearing, 100-103 in sitting with PT overpressure. His history is significant for decreased flexion ROM even prior to surgery. Significant atrophy of quadriceps noted, particularly VMO. Patient progressing well with quad activation in this session, will dedicate more therapy sessions towards improvement of quad strength/muscle mass.    Rehab Potential Excellent   PT Frequency 2x / week   PT Duration 6 weeks   PT Treatment/Interventions ADLs/[place] [facility] Management;Electrical Stimulation;Therapeutic exercise;Passive range of motion;Patient/family education   PT Next Visit Plan Pt will work on PROM as well as pain modulation using e-stim. Quad activation will also be addressed using e-stim.   PT Home Exercise Plan Mini squats, TKEs, and knee flexion mobilizations.    Consulted and Agree with Plan of Care Patient;Family member/caregiver      Patient will benefit from skilled therapeutic intervention in order to improve the following deficits and impairments:  Abnormal gait, Pain, Decreased mobility, Decreased strength, Difficulty walking, Decreased range of motion, Increased edema  Visit Diagnosis: Chronic pain of right knee  Difficulty in walking, not elsewhere classified  Status post right knee replacement     Problem List Patient Active Problem List   Diagnosis Date Noted  . OA (osteoarthritis) of knee 08/15/2016  . 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 Personal history of diseases of skin or subcutaneous tissue 07/30/2015  . Essential thrombocytosis ([place]) 07/30/2015   Royce Macadamia PT, DPT, CSCS    09/20/2016, 11:37 AM  [place] [facility] [address], [place], [address] Phone: [phone]   Fax:  [phone]  Name: Anthony Graves MRN: [id number] Date of Birth: [birth year]

## 2016-09-22 ENCOUNTER — Ambulatory Visit: Payer: BLUE CROSS/BLUE SHIELD | Admitting: Physical Therapy

## 2016-09-22 DIAGNOSIS — G8929 Other chronic pain: Secondary | ICD-10-CM

## 2016-09-22 DIAGNOSIS — M25561 Pain in right knee: Principal | ICD-10-CM

## 2016-09-22 DIAGNOSIS — Z96651 Presence of right artificial knee joint: Secondary | ICD-10-CM

## 2016-09-22 DIAGNOSIS — R262 Difficulty in walking, not elsewhere classified: Secondary | ICD-10-CM

## 2016-09-22 NOTE — Patient Instructions (Addendum)
TRX Rows   Lunges to step (up to 103-104 degrees)    Leg Press - 75# x 8 repetitions, progressed to 95# x 8, 105# x 8  Leg Extensions 10#x 8, 15#x 8, 15# x   Step Ups

## 2016-09-23 NOTE — Therapy (Signed)
[place] [facility] [address] [place], [address] Phone: [phone]   Fax:  [phone]  Physical Therapy Treatment  Patient Details  Name: Anthony Graves MRN: [id number] Date of Birth: [birth year] No Data Recorded  Encounter Date: 09/22/2016      PT End of Session - 09/23/16 1003    Visit Number 12   Number of Visits 13   Date for PT Re-Evaluation 10/11/16   PT Start Time 0945   PT Stop Time 1030   PT Time Calculation (min) 45 min   Activity Tolerance Patient tolerated treatment well   Behavior During Therapy [facility] for tasks assessed/performed      Past Medical History:  Diagnosis Date  . Arthritis    osteoartiritis  . High platelet count ([place])     Past Surgical History:  Procedure Laterality Date  . COLONOSCOPY  2016  . CYSTECTOMY  years ago   pilonidal cyst removed  . KNEE SURGERY Right 20 years ago   arthrocscopy  . TOTAL KNEE ARTHROPLASTY Right 08/15/2016   Procedure: RIGHT TOTAL KNEE ARTHROPLASTY;  Surgeon: Gaynelle Arabian, MD;  Location: [facility];  Service: Orthopedics;  Laterality: Right;    There were no vitals filed for this visit.      Subjective Assessment - 09/22/16 0947    Subjective Patient reports he had a positive follow up with his MD, does not require the crutch at any point any longer. He would like to continue with therapy to increase his stamina/LE strength.    Patient is accompained by: Family member   Limitations Lifting;Walking;Standing;House hold activities   Patient Stated Goals Pt reports he would like to return to tennis, golf, and 4-5 mile walks.   Currently in Pain? Other (Comment)  Stiffness moreso than pain      TRX Squats - 2 sets x 12 repetitions through ~ 90-100 degrees, though continues to shift weight to LLE  Lunges to step (up to 103-104 degrees)  X 2 minutes through available ROM.   Leg Press - 75# x 8 repetitions, progressed to 95# x 8, 105# x 8  Leg  Extensions 10#x 8, 15#x 8, 15# x 8 repetitions (on RLE) -- appropriate activation of quadricep   Step Ups with 1 riser initially x 10 repetitions (able to perform more challenging activity), progressed to 2 risers x 10 repetitions for 2 sets (difficult, appropriate activation of quadricep)                            PT Education - 09/23/16 1003    Education provided Yes   Education Details Provided article detailing strength and conditioning for post TKR.    Person(s) Educated Patient;Spouse   Methods Explanation;Demonstration   Comprehension Verbalized understanding;Returned demonstration          PT Short Term Goals - 08/18/16 1158      PT SHORT TERM GOAL #1   Title Pt will increase knee extension ROM to -10 deg   Baseline -18 deg   Time 2   Period Weeks   Status New     PT SHORT TERM GOAL #2   Title Pt will increase knee extension ROM to 110 deg.   Baseline 40 deg   Time 2   Period Weeks           PT Long Term Goals - 08/18/16 1202      PT LONG TERM  GOAL #1   Title Pt will walk an 18 hole golf course without pain to return to recreational activities.    Baseline unable to walk golf course   Time 6   Period Weeks   Status New     PT LONG TERM GOAL #2   Title Pt will walk 4-5 miles to return to recreational activities.    Baseline unable   Time 6   Period Weeks   Status New     PT LONG TERM GOAL #3   Title Pt will score a 25 on the FGA to demonstrate independence and safety with mobility.    Time 6   Period Weeks   Status New     PT LONG TERM GOAL #4   Title  Pt will reach R knee extension of 0 deg to demonstrate optimal ROM for long term pain reduction.    Baseline -18 deg   Time 6   Period Weeks   Status New     PT LONG TERM GOAL #5   Title Pt will reach R knee flexion of 110 deg. to complete functional tasks such as stair ascent/descent.    Baseline 40 deg   Time 6   Period Weeks   Status New               Plan  - 09/23/16 1004    Clinical Impression Statement Patient had a positive check in with MD, has made excellent progress with his knee flexion ROM. He still has deficits with knee extension in gait (not full extension) and atrophy of his L quadricep, however both improving with targeted therapy though he would benefit from additional skilled PT services to address his gait abnormalities and L quadricep strength deficits.    Rehab Potential Excellent   PT Frequency 2x / week   PT Duration 6 weeks   PT Treatment/Interventions ADLs/[place] [facility] Management;Electrical Stimulation;Therapeutic exercise;Passive range of motion;Patient/family education   PT Next Visit Plan Pt will work on PROM as well as pain modulation using e-stim. Quad activation will also be addressed using e-stim.   PT Home Exercise Plan Mini squats, TKEs, and knee flexion mobilizations.    Consulted and Agree with Plan of Care Patient;Family member/caregiver      Patient will benefit from skilled therapeutic intervention in order to improve the following deficits and impairments:  Abnormal gait, Pain, Decreased mobility, Decreased strength, Difficulty walking, Decreased range of motion, Increased edema  Visit Diagnosis: Chronic pain of right knee  Difficulty in walking, not elsewhere classified  Status post right knee replacement     Problem List Patient Active Problem List   Diagnosis Date Noted  . OA (osteoarthritis) of knee 08/15/2016  . 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 Personal history of diseases of skin or subcutaneous tissue 07/30/2015  . Essential thrombocytosis ([place]) 07/30/2015   Royce Macadamia PT, DPT, CSCS   09/23/2016, 10:05 AM  [place] [facility] [address] [place], [address] Phone: [phone]   Fax:  [phone]  Name: Anthony Graves MRN: [id number] Date of Birth: 1961/07/16

## 2016-09-26 ENCOUNTER — Ambulatory Visit: Payer: BLUE CROSS/BLUE SHIELD | Admitting: Physical Therapy

## 2016-09-26 DIAGNOSIS — R262 Difficulty in walking, not elsewhere classified: Secondary | ICD-10-CM

## 2016-09-26 DIAGNOSIS — M25561 Pain in right knee: Secondary | ICD-10-CM | POA: Diagnosis not present

## 2016-09-26 DIAGNOSIS — Z96651 Presence of right artificial knee joint: Secondary | ICD-10-CM

## 2016-09-26 DIAGNOSIS — G8929 Other chronic pain: Secondary | ICD-10-CM

## 2016-09-26 NOTE — Patient Instructions (Signed)
TG Single leg squats x 10 for 3 sets at level 20  Single leg knee extensions on OMEGA 3 sets x 8 repetitions at 10#   Sit to stands with 7# DB x 12, with 10# x 10 (still fairly easy)   Step Ups

## 2016-09-26 NOTE — Therapy (Signed)
[place] [facility] [address] [place], [address] Phone: [phone]   Fax:  [phone]  Physical Therapy Treatment  Patient Details  Name: Anthony Graves MRN: [id number] Date of Birth: [birth year] No Data Recorded  Encounter Date: 09/26/2016      PT End of Session - 09/26/16 1054    Visit Number 13   Number of Visits 21   Date for PT Re-Evaluation 11/07/16   PT Start Time 1036   PT Stop Time 1115   PT Time Calculation (min) 39 min   Activity Tolerance Patient tolerated treatment well   Behavior During Therapy [facility] for tasks assessed/performed      Past Medical History:  Diagnosis Date  . Arthritis    osteoartiritis  . High platelet count ([place])     Past Surgical History:  Procedure Laterality Date  . COLONOSCOPY  2016  . CYSTECTOMY  years ago   pilonidal cyst removed  . KNEE SURGERY Right 20 years ago   arthrocscopy  . TOTAL KNEE ARTHROPLASTY Right 08/15/2016   Procedure: RIGHT TOTAL KNEE ARTHROPLASTY;  Surgeon: Gaynelle Arabian, MD;  [facility];  Service: Orthopedics;  Laterality: Right;    There were no vitals filed for this visit.      Subjective Assessment - 09/26/16 1036    Subjective Patient reports he is doing well, no complaints. He did a lot of yard work on Sunday with some increase in pain but overall feels he is doing quite well.    Patient is accompained by: Family member   Limitations Lifting;Walking;Standing;House hold activities   Patient Stated Goals Pt reports he would like to return to tennis, golf, and 4-5 mile walks.   Currently in Pain? No/denies  More stiffness than pain      TG Single leg squats x 10 for 3 sets at level 20  Single leg knee extensions on OMEGA 3 sets x 8 repetitions at 10#   Sit to stands with 7# DB x 12, with 10# x 10 (still fairly easy)   Step Ups to 1, 2 risers x 15 in each condition with HHA bilaterally (no pain reported, conscious control of  femur not internally rotating).   Knee extension mobs x 8 minutes (able to get to ~ 3 degrees of extension in supine) patient reports discomfort throughout bout, however tolerable. A-P mobilizations provided via proximal tibia and distal femur.                             PT Education - 09/26/16 1054    Education provided Yes   Education Details Will begin to focus on extension ROM and strength.    Person(s) Educated Patient;Spouse   Methods Explanation;Demonstration;Handout   Comprehension Verbalized understanding;Returned demonstration          PT Short Term Goals - 09/26/16 1058      PT SHORT TERM GOAL #1   Title Pt will increase knee extension ROM to -10 deg   Baseline -18 deg   Time 2   Period Weeks   Status New     PT SHORT TERM GOAL #2   Title Pt will increase knee extension ROM to 110 deg.   Baseline 40 deg -- to 106 on 3/12    Time 2   Period Weeks   Status Partially Met           PT Long Term Goals -  09/26/16 1059      PT LONG TERM GOAL #1   Title Pt will walk an 18 hole golf course without pain to return to recreational activities.    Baseline Has not tried golfing yet.    Time 6   Period Weeks   Status New     PT LONG TERM GOAL #2   Title Pt will walk 4-5 miles to return to recreational activities.    Baseline Has been able to    Time 6   Period Weeks   Status On-going     PT LONG TERM GOAL #3   Title Pt will score a 25 on the FGA to demonstrate independence and safety with mobility.    Time 6   Period Weeks   Status On-going     PT LONG TERM GOAL #4   Title  Pt will reach R knee extension of 0 deg to demonstrate optimal ROM for long term pain reduction.    Baseline -18 deg   Time 6   Period Weeks   Status On-going     PT LONG TERM GOAL #5   Title Pt will reach R knee flexion of 110 deg. to complete functional tasks such as stair ascent/descent.    Baseline 40 deg -- to 106 degrees    Time 6   Period Weeks    Status On-going               Plan - 09/26/16 1058    Clinical Impression Statement Patient continues to have deficit with extension ROM during gait, though much improved from previous sessions. He is able to achieve roughly 5 degrees from extension without overpressure, which is much closer to 0. He is demonstrating improved quadricep strength and contraction allowing for more normalized gait pattern.   Rehab Potential Excellent   PT Frequency 2x / week   PT Duration 6 weeks   PT Treatment/Interventions ADLs/[place] [facility] Management;Electrical Stimulation;Therapeutic exercise;Passive range of motion;Patient/family education   PT Next Visit Plan Pt will work on PROM as well as pain modulation using e-stim. Quad activation will also be addressed using e-stim.   PT Home Exercise Plan Mini squats, TKEs, and knee flexion mobilizations.    Consulted and Agree with Plan of Care Patient;Family member/caregiver      Patient will benefit from skilled therapeutic intervention in order to improve the following deficits and impairments:  Abnormal gait, Pain, Decreased mobility, Decreased strength, Difficulty walking, Decreased range of motion, Increased edema  Visit Diagnosis: Chronic pain of right knee  Difficulty in walking, not elsewhere classified  Status post right knee replacement     Problem List Patient Active Problem List   Diagnosis Date Noted  . OA (osteoarthritis) of knee 08/15/2016  . 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 Personal history of diseases of skin or subcutaneous tissue 07/30/2015  . Essential thrombocytosis ([place]) 07/30/2015   Royce Macadamia PT, DPT, CSCS    09/26/2016, 1:56 PM  [facility] [address] [place], [address] Phone: [phone]   Fax:  [phone]  Name: Bryer Cozzolino Schwegler MRN: [id number] Date of Birth: January 11, 1961

## 2016-09-28 ENCOUNTER — Encounter: Payer: BLUE CROSS/BLUE SHIELD | Admitting: Physical Therapy

## 2016-09-29 ENCOUNTER — Ambulatory Visit: Payer: BLUE CROSS/BLUE SHIELD | Admitting: Physical Therapy

## 2016-09-29 DIAGNOSIS — Z96651 Presence of right artificial knee joint: Secondary | ICD-10-CM

## 2016-09-29 DIAGNOSIS — R262 Difficulty in walking, not elsewhere classified: Secondary | ICD-10-CM

## 2016-09-29 DIAGNOSIS — G8929 Other chronic pain: Secondary | ICD-10-CM

## 2016-09-29 DIAGNOSIS — M25561 Pain in right knee: Secondary | ICD-10-CM | POA: Diagnosis not present

## 2016-09-29 NOTE — Therapy (Signed)
[place] [facility] [address] [place], [address] Phone: [phone]   Fax:  [phone]  Physical Therapy Treatment  Patient Details  Name: Anthony Graves MRN: [id number] Date of Birth: [birth year] No Data Recorded  Encounter Date: 09/29/2016      PT End of Session - 09/29/16 0906    Visit Number 14   Number of Visits 21   Date for PT Re-Evaluation 11/07/16   PT Start Time 0904   PT Stop Time 0946   PT Time Calculation (min) 42 min   Activity Tolerance Patient tolerated treatment well   Behavior During Therapy [facility] for tasks assessed/performed      Past Medical History:  Diagnosis Date  . Arthritis    osteoartiritis  . High platelet count ([place])     Past Surgical History:  Procedure Laterality Date  . COLONOSCOPY  2016  . CYSTECTOMY  years ago   pilonidal cyst removed  . KNEE SURGERY Right 20 years ago   arthrocscopy  . TOTAL KNEE ARTHROPLASTY Right 08/15/2016   Procedure: RIGHT TOTAL KNEE ARTHROPLASTY;  Surgeon: Gaynelle Arabian, MD;  Location: [facility];  Service: Orthopedics;  Laterality: Right;    There were no vitals filed for this visit.      Subjective Assessment - 09/29/16 0904    Subjective Patient reports he has had more swelling this morning, otherwise he is doing well.    Patient is accompained by: Family member   Limitations Lifting;Walking;Standing;House hold activities   Patient Stated Goals Pt reports he would like to return to tennis, golf, and 4-5 mile walks.   Currently in Pain? Yes  Stiffness/ache from swelling in the knee joint.       Second step knee flexion stretch to 105 degrees in this session (x 3 minutes)  NuStep for increased knee flexion/extension cycling warm up x 3 minutes (unbilled)   Knee Extension mobilizations to get to end range - x 5 minutes (able to get to 2-3 degrees off of horizontal on the table, though uncomfortable for him.   Lunges x 15 with L HHA (RLE  forward)   TRX Lunges x 15 for 2 sets   Sit to stands with weight (10#) - x 12, with 20# DB x12   Gait with green t-band providing overpressure into extension at heel strike -- initially observed to have leg of knee extensors bringing into TKE, however with video after overpressure into extension noted to have significantly less flexion at heel strike than in previous session (last week) roughly 5 degrees still.                             PT Education - 09/29/16 0905    Education provided Yes   Education Details Will begin to wean from therapy, focus on knee extensor strengthening.    Person(s) Educated Patient   Methods Explanation;Demonstration;Handout   Comprehension Verbalized understanding;Returned demonstration          PT Short Term Goals - 09/26/16 1058      PT SHORT TERM GOAL #1   Title Pt will increase knee extension ROM to -10 deg   Baseline -18 deg   Time 2   Period Weeks   Status New     PT SHORT TERM GOAL #2   Title Pt will increase knee extension ROM to 110 deg.   Baseline 40 deg -- to 106 on 3/12  Time 2   Period Weeks   Status Partially Met           PT Long Term Goals - 09/26/16 1059      PT LONG TERM GOAL #1   Title Pt will walk an 18 hole golf course without pain to return to recreational activities.    Baseline Has not tried golfing yet.    Time 6   Period Weeks   Status New     PT LONG TERM GOAL #2   Title Pt will walk 4-5 miles to return to recreational activities.    Baseline Has been able to    Time 6   Period Weeks   Status On-going     PT LONG TERM GOAL #3   Title Pt will score a 25 on the FGA to demonstrate independence and safety with mobility.    Time 6   Period Weeks   Status On-going     PT LONG TERM GOAL #4   Title  Pt will reach R knee extension of 0 deg to demonstrate optimal ROM for long term pain reduction.    Baseline -18 deg   Time 6   Period Weeks   Status On-going     PT LONG TERM  GOAL #5   Title Pt will reach R knee flexion of 110 deg. to complete functional tasks such as stair ascent/descent.    Baseline 40 deg -- to 106 degrees    Time 6   Period Weeks   Status On-going               Plan - 09/29/16 1349    Clinical Impression Statement Patient continues to have decrase ROM in flexion and slight decrease in extension during gait which were addressed with grade III knee extension mobilizations, knee extnsion cueing during giat, lunges, and weighted sit<>stands. Extension mobilizations got the patient to reach close to 0 degrees. There is still noted normal amount of swelling in this stage of rehab, but hids gait is becoming more normalized with increase knee extension.   Rehab Potential Excellent   PT Frequency 2x / week   PT Duration 6 weeks   PT Treatment/Interventions ADLs/[place] [facility] Management;Electrical Stimulation;Therapeutic exercise;Passive range of motion;Patient/family education   PT Next Visit Plan Pt will work on PROM as well as pain modulation using e-stim. Quad activation will also be addressed using e-stim.   PT Home Exercise Plan Mini squats, TKEs, and knee flexion mobilizations.    Consulted and Agree with Plan of Care Patient;Family member/caregiver      Patient will benefit from skilled therapeutic intervention in order to improve the following deficits and impairments:  Abnormal gait, Pain, Decreased mobility, Decreased strength, Difficulty walking, Decreased range of motion, Increased edema  Visit Diagnosis: Chronic pain of right knee  Difficulty in walking, not elsewhere classified  Status post right knee replacement     Problem List Patient Active Problem List   Diagnosis Date Noted  . OA (osteoarthritis) of knee 08/15/2016  . 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 Personal history of diseases of skin or subcutaneous tissue 07/30/2015  . Essential thrombocytosis ([place]) 07/30/2015   Royce Macadamia PT, DPT, CSCS     09/29/2016, 3:35 PM  [place] [facility] [address], [place], [address] Phone: [phone]   Fax:  [phone]  Name: Anthony Graves MRN: [id number] Date of Birth: 1960/10/26

## 2016-09-29 NOTE — Patient Instructions (Addendum)
Second step knee flexion stretch to 105 degrees in this session (x 3 minutes)  NuStep for increased knee flexion/extension cycling warm up x 3 minutes (unbilled)   Knee Extension mobilizations to get to end range - x 5  Lunges x 15 with L HHA (RLE forward)   TRX Lunges x 15 for 2 sets   Sit to stands with weight (10#) - x 12, with 20# DB x12   Gait with green t-band providing overpressure into extension at heel strike

## 2016-10-03 ENCOUNTER — Ambulatory Visit: Payer: BLUE CROSS/BLUE SHIELD | Admitting: Physical Therapy

## 2016-10-03 DIAGNOSIS — R262 Difficulty in walking, not elsewhere classified: Secondary | ICD-10-CM

## 2016-10-03 DIAGNOSIS — Z96651 Presence of right artificial knee joint: Secondary | ICD-10-CM

## 2016-10-03 DIAGNOSIS — M25561 Pain in right knee: Secondary | ICD-10-CM | POA: Diagnosis not present

## 2016-10-03 DIAGNOSIS — G8929 Other chronic pain: Secondary | ICD-10-CM

## 2016-10-03 NOTE — Patient Instructions (Signed)
Single leg TG squats 3 sets x 12   Knee extension mobilizations   Gait observation - still missing TKE on R at heel strike but not in mid-stance   Leg press on RLE only -- 35# with cuing to hold at end range 3 sets x 15 repetitions   2nd step lunge position to measure knee flexion - to 105-108 degrees.   Green band assisted knee extensions in walking

## 2016-10-03 NOTE — Therapy (Signed)
[facility] [address], [place], [address] Phone: [phone]   Fax:  [phone]  Physical Therapy Treatment  Patient Details  Name: Anthony Graves MRN: [id number] Date of Birth: 1960-10-15 No Data Recorded  Encounter Date: 10/03/2016      PT End of Session - 10/03/16 1030    Visit Number 15   Number of Visits 21   Date for PT Re-Evaluation 11/07/16   PT Start Time 0815   PT Stop Time 0900   PT Time Calculation (min) 45 min   Activity Tolerance Patient tolerated treatment well   Behavior During Therapy [facility] for tasks assessed/performed      Past Medical History:  Diagnosis Date  . Arthritis    osteoartiritis  . 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 COLONOSCOPY  2016  . CYSTECTOMY  years ago   pilonidal cyst removed  . KNEE SURGERY Right 20 years ago   arthrocscopy  . TOTAL KNEE ARTHROPLASTY Right 08/15/2016   Procedure: RIGHT TOTAL KNEE ARTHROPLASTY;  Surgeon: Gaynelle Arabian, MD;  Location: [facility];  Service: Orthopedics;  Laterality: Right;    There were no vitals filed for this visit.      Subjective Assessment - 10/03/16 1028    Subjective Patient reports he was able to perform elliptical over the weekend and felt relatively good with his alignment and ability to complete without pain. He does report he had increase in gluteal pain after performing lunges, but has had this before even pre-op.    Patient is accompained by: Family member   Limitations Lifting;Walking;Standing;House hold activities   Patient Stated Goals Pt reports he would like to return to tennis, golf, and 4-5 mile walks.   Currently in Pain? No/denies  Reports feeling better, though continues to have some stiffness from swelling.       Single leg TG squats 3 sets x 12 on level 16  Knee extension mobilizations x 8 minutes with towel roll underneath heel (able to achieve full  knee extension with overpressure)   Gait observation - still missing TKE on R at heel strike but not in mid-stance on video tape, this appears improved from previous week, appears related to tone in hamstring (this position places maximal stretch on HS)  Leg press on RLE only -- 35# with cuing to hold at end range 3 sets x 15 repetitions   2nd step lunge position to measure knee flexion - to 105-108 degrees.   Green band assisted knee extensions in walking x 8 laps with overpressure placed right before heel strike.                             PT Education - 10/03/16 1030    Education provided Yes   Education Details Will re-assess knee extension in gait next week, until then complete gym based strengthening program for knee extensors.    Person(s) Educated Patient   Methods Explanation;Demonstration   Comprehension Verbalized understanding;Returned demonstration          PT Short Term Goals - 09/26/16 1058      PT SHORT TERM GOAL #1   Title Pt will increase knee extension ROM to -10 deg   Baseline -18 deg   Time 2   Period Weeks   Status New     PT SHORT TERM GOAL #2  Title Pt will increase knee extension ROM to 110 deg.   Baseline 40 deg -- to 106 on 3/12    Time 2   Period Weeks   Status Partially Met           PT Long Term Goals - 09/26/16 1059      PT LONG TERM GOAL #1   Title Pt will walk an 18 hole golf course without pain to return to recreational activities.    Baseline Has not tried golfing yet.    Time 6   Period Weeks   Status New     PT LONG TERM GOAL #2   Title Pt will walk 4-5 miles to return to recreational activities.    Baseline Has been able to    Time 6   Period Weeks   Status On-going     PT LONG TERM GOAL #3   Title Pt will score a 25 on the FGA to demonstrate independence and safety with mobility.    Time 6   Period Weeks   Status On-going     PT LONG TERM GOAL #4   Title  Pt will reach R knee extension of  0 deg to demonstrate optimal ROM for long term pain reduction.    Baseline -18 deg   Time 6   Period Weeks   Status On-going     PT LONG TERM GOAL #5   Title Pt will reach R knee flexion of 110 deg. to complete functional tasks such as stair ascent/descent.    Baseline 40 deg -- to 106 degrees    Time 6   Period Weeks   Status On-going               Plan - 10/03/16 1031    Clinical Impression Statement Paitent has maintained knee flexion ROM nicely, continues to lack terminal knee extension slightly at heel strike, but not in midstance or on the table now. He is progressing well with quadriceps strengthening, though noted to have significant atrophy in R quadricep area. Provided patient with strengthening program for quadricep and follow up in 1 week to observe terminal knee extension at heel strike.    Rehab Potential Excellent   PT Frequency 2x / week   PT Duration 6 weeks   PT Treatment/Interventions ADLs/[facility] Management;Electrical Stimulation;Therapeutic exercise;Passive range of motion;Patient/family education   PT Next Visit Plan Pt will work on PROM as well as pain modulation using e-stim. Quad activation will also be addressed using e-stim.   PT Home Exercise Plan Mini squats, TKEs, and knee flexion mobilizations.    Consulted and Agree with Plan of Care Patient;Family member/caregiver      Patient will benefit from skilled therapeutic intervention in order to improve the following deficits and impairments:  Abnormal gait, Pain, Decreased mobility, Decreased strength, Difficulty walking, Decreased range of motion, Increased edema  Visit Diagnosis: Chronic pain of right knee  Difficulty in walking, not elsewhere classified  Status post right knee replacement     Problem List Patient Active Problem List   Diagnosis Date Noted  . OA (osteoarthritis) of knee 08/15/2016  . 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 Personal history of diseases of skin  or subcutaneous tissue 07/30/2015  . Essential thrombocytosis ([place]) 07/30/2015   Royce Macadamia PT, DPT, CSCS    10/03/2016, 12:47 PM  [facility] [address], [place], [address] Phone: [phone]   Fax:  [phone]  Name: Anthony Graves MRN: [id number] Date of Birth: 14-Sep-1960

## 2016-10-05 ENCOUNTER — Ambulatory Visit: Payer: BLUE CROSS/BLUE SHIELD | Admitting: Physical Therapy

## 2016-10-10 ENCOUNTER — Ambulatory Visit: Payer: BLUE CROSS/BLUE SHIELD | Admitting: Physical Therapy

## 2016-10-10 ENCOUNTER — Other Ambulatory Visit: Payer: Self-pay | Admitting: Emergency Medicine

## 2016-10-10 DIAGNOSIS — R262 Difficulty in walking, not elsewhere classified: Secondary | ICD-10-CM

## 2016-10-10 DIAGNOSIS — M25561 Pain in right knee: Principal | ICD-10-CM

## 2016-10-10 DIAGNOSIS — G8929 Other chronic pain: Secondary | ICD-10-CM

## 2016-10-10 DIAGNOSIS — Z96651 Presence of right artificial knee joint: Secondary | ICD-10-CM

## 2016-10-10 MED ORDER — OSELTAMIVIR PHOSPHATE 75 MG PO CAPS: 75.0000 mg | ORAL_CAPSULE | Freq: Every day | ORAL | 0 refills | Status: DC

## 2016-10-10 NOTE — Patient Instructions (Signed)
NuStep x 5 minutes   2nd step lunge stretch - initially -- 100 degrees (feels stiffness)   Omega HS curls -- 20# x 15, 35# x9, x 12   Hops on Total Gym - level 12 x 10 repetitions, level 15 x 10, level 17   Single leg squats on TG level 17 x 10 on RLE.   HS stretching in 90-90   Squats to KB on the floor with 20# (able to keep weight distributed appropriately) x 12 repetitions, increased to 40# x10 for 2 sets   BOSU single leg stance x 6

## 2016-10-10 NOTE — Therapy (Signed)
[facility] [address], [place], [address] Phone: [phone]   Fax:  [phone]  Physical Therapy Treatment  Patient Details  Name: Anthony Graves MRN: [id number] Date of Birth: 1961-04-20 No Data Recorded  Encounter Date: 10/10/2016      PT End of Session - 10/10/16 1002    Visit Number 16   Number of Visits 21   Date for PT Re-Evaluation 11/07/16   PT Start Time 0900   PT Stop Time 0950   PT Time Calculation (min) 50 min   Activity Tolerance Patient tolerated treatment well   Behavior During Therapy [facility] for tasks assessed/performed      Past Medical History:  Diagnosis Date  . Arthritis    osteoartiritis  . High platelet count ([place])     Past Surgical History:  Procedure Laterality Date  . COLONOSCOPY  2016  . CYSTECTOMY  years ago   pilonidal cyst removed  . KNEE SURGERY Right 20 years ago   arthrocscopy  . TOTAL KNEE ARTHROPLASTY Right 08/15/2016   Procedure: RIGHT TOTAL KNEE ARTHROPLASTY;  Surgeon: Gaynelle Arabian, MD;  Location: [facility];  Service: Orthopedics;  Laterality: Right;    There were no vitals filed for this visit.      Subjective Assessment - 10/10/16 7858    Patient is accompained by: Family member   Limitations Lifting;Walking;Standing;House hold activities   Patient Stated Goals Pt reports he would like to return to tennis, golf, and 4-5 mile walks.   Currently in Pain? No/denies      NuStep x 5 minutes (unbilled warm up for increasing knee flexion)   2nd step lunge stretch - initially -- 100 degrees (feels stiffness)   Omega HS curls -- 20# x 15, 35# x9, x 12 -- reports prior to sx this was painful, however no pain reported though activation of the area that had been painful.   Hops on Total Gym - level 12 x 10 repetitions, level 15 x 10, level 17 -- notably challenging on RLE, unable to get through full extension   Single leg squats on TG level 17 x 10 on RLE for  3 sets on RLE (notable for challenge on R quadricep-- significantly lacking muscle mass in medial quadricep)  HS stretching in 90-90 -- initially mildly tender, reduced with prolonged bout of stretching   Squats to KB on the floor with 20# (able to keep weight distributed appropriately) x 12 repetitions, increased to 40# x10 for 2 sets   BOSU single leg stance x 6 -- fatigued after bout in quadricep, improved to holding SLS > 5" on RLE                             PT Education - 10/10/16 1001    Education provided Yes   Education Details Likely just a hamstring strain though will continue to monitor.    Person(s) Educated Patient   Methods Explanation;Demonstration   Comprehension Verbalized understanding;Returned demonstration          PT Short Term Goals - 09/26/16 1058      PT SHORT TERM GOAL #1   Title Pt will increase knee extension ROM to -10 deg   Baseline -18 deg   Time 2   Period Weeks   Status New     PT SHORT TERM GOAL #2   Title Pt will increase knee extension ROM to 110  deg.   Baseline 40 deg -- to 106 on 3/12    Time 2   Period Weeks   Status Partially Met           PT Long Term Goals - 09/26/16 1059      PT LONG TERM GOAL #1   Title Pt will walk an 18 hole golf course without pain to return to recreational activities.    Baseline Has not tried golfing yet.    Time 6   Period Weeks   Status New     PT LONG TERM GOAL #2   Title Pt will walk 4-5 miles to return to recreational activities.    Baseline Has been able to    Time 6   Period Weeks   Status On-going     PT LONG TERM GOAL #3   Title Pt will score a 25 on the FGA to demonstrate independence and safety with mobility.    Time 6   Period Weeks   Status On-going     PT LONG TERM GOAL #4   Title  Pt will reach R knee extension of 0 deg to demonstrate optimal ROM for long term pain reduction.    Baseline -18 deg   Time 6   Period Weeks   Status On-going     PT  LONG TERM GOAL #5   Title Pt will reach R knee flexion of 110 deg. to complete functional tasks such as stair ascent/descent.    Baseline 40 deg -- to 106 degrees    Time 6   Period Weeks   Status On-going               Plan - 10/10/16 1004    Clinical Impression Statement Patient appears to have had minor set back last week with what appears to be a hamstring strain from elliptical during gym based program. He responds well to stretching today and is able to complete HS stretching/strengthening without increase in pain today. He continues to have marked atrophy of R quadricep and swelling in the knee joint, though quadricep strength and output is certainly improving as he is able to complete light plyometrics today.    Rehab Potential Excellent   PT Frequency 2x / week   PT Duration 6 weeks   PT Treatment/Interventions ADLs/[facility] Management;Electrical Stimulation;Therapeutic exercise;Passive range of motion;Patient/family education   PT Next Visit Plan Pt will work on PROM as well as pain modulation using e-stim. Quad activation will also be addressed using e-stim.   PT Home Exercise Plan Mini squats, TKEs, and knee flexion mobilizations.    Consulted and Agree with Plan of Care Patient;Family member/caregiver      Patient will benefit from skilled therapeutic intervention in order to improve the following deficits and impairments:  Abnormal gait, Pain, Decreased mobility, Decreased strength, Difficulty walking, Decreased range of motion, Increased edema  Visit Diagnosis: Chronic pain of right knee  Status post right knee replacement  Difficulty in walking, not elsewhere classified     Problem List Patient Active Problem List   Diagnosis Date Noted  . OA (osteoarthritis) of knee 08/15/2016  . 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 Personal history of diseases of skin or subcutaneous tissue 07/30/2015  . Essential thrombocytosis ([place]) 07/30/2015   Royce Macadamia PT, DPT, CSCS    10/10/2016, 10:08 AM  [facility] [address] [place], [address] Phone: [phone]  Fax:  [phone]  Name: Anthony Graves MRN: [id number] Date of Birth: [birth year]

## 2016-10-12 ENCOUNTER — Encounter: Payer: BLUE CROSS/BLUE SHIELD | Admitting: Physical Therapy

## 2016-10-17 ENCOUNTER — Ambulatory Visit: Payer: BLUE CROSS/BLUE SHIELD | Attending: Orthopedic Surgery | Admitting: Physical Therapy

## 2016-10-17 ENCOUNTER — Other Ambulatory Visit: Payer: Self-pay | Admitting: Internal Medicine

## 2016-10-17 DIAGNOSIS — Z96651 Presence of right artificial knee joint: Secondary | ICD-10-CM

## 2016-10-17 DIAGNOSIS — R262 Difficulty in walking, not elsewhere classified: Secondary | ICD-10-CM | POA: Diagnosis not present

## 2016-10-17 DIAGNOSIS — G8929 Other chronic pain: Secondary | ICD-10-CM | POA: Diagnosis not present

## 2016-10-17 DIAGNOSIS — D473 Essential (hemorrhagic) thrombocythemia: Secondary | ICD-10-CM

## 2016-10-17 DIAGNOSIS — M25561 Pain in right knee: Secondary | ICD-10-CM | POA: Diagnosis not present

## 2016-10-17 NOTE — Patient Instructions (Signed)
NuStep for warm up to increase knee flexion ROM (level 1)   2nd step lunge stretch - to 101/102 degrees continues to have increased swelling around the R knee   In weightbearing at knee joint  R - 40.9 cm L - 35.5 cm circumfrence   35/32 just below patella (R/L)   SIngle leg leg press 3x10 @ 55#   Lunges onto BOSU bilaterally 3x12   Step ups to BOSU x 15 for 1 set (fairly easy)   Total Gym single leg squats at level 23  X 12 for 2 sets

## 2016-10-17 NOTE — Therapy (Signed)
[facility] [address] [place], [address] Phone: [phone]   Fax:  [phone]  Physical Therapy Treatment  Patient Details  Name: Anthony Graves MRN: [id number] Date of Birth: [birth year] No Data Recorded  Encounter Date: 10/17/2016      PT End of Session - 10/17/16 0856    Visit Number 17   Number of Visits 21   Date for PT Re-Evaluation 11/07/16   PT Start Time 0900   PT Stop Time 0945   PT Time Calculation (min) 45 min   Activity Tolerance Patient tolerated treatment well   Behavior During Therapy [facility] for tasks assessed/performed      Past Medical History:  Diagnosis Date  . Arthritis    osteoartiritis  . 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 COLONOSCOPY  2016  . CYSTECTOMY  years ago   pilonidal cyst removed  . KNEE SURGERY Right 20 years ago   arthrocscopy  . TOTAL KNEE ARTHROPLASTY Right 08/15/2016   Procedure: RIGHT TOTAL KNEE ARTHROPLASTY;  Surgeon: Gaynelle Arabian, MD;  Location: [facility];  Service: Orthopedics;  Laterality: Right;    There were no vitals filed for this visit.      Subjective Assessment - 10/17/16 0855    Subjective Patient reports he continues to have stiffness and swelling in the knee, will see his MD tomorrow. His hamstring pain has dissipated.    Patient is accompained by: Family member   Limitations Lifting;Walking;Standing;House hold activities   Patient Stated Goals Pt reports he would like to return to tennis, golf, and 4-5 mile walks.   Currently in Pain? No/denies      NuStep for warm up to increase knee flexion ROM (level 1) x 5 minutes (unbilled)  2nd step lunge stretch - to 101/102 degrees continues to have increased swelling around the R knee   In weightbearing at knee joint  R - 40.9 cm L - 35.5 cm circumfrence   35/32 just below patella (R/L)   ** Discussed with occupational therapist about  potential solutions for reducing swelling at knee joint as this is almost certainly impairing his gait and knee flexion. Provided compression wrap for the afternoons while he is on his feet and Tedhoes and Tubi grip on the knee joint while sleeping.   SIngle leg leg press 3x10 @ 55#   Lunges onto BOSU bilaterally 3x12   Step ups to BOSU x 15 for 1 set (fairly easy)   Total Gym single leg squats at level 23  X 12 for 2 sets (quite challenging for him).                             PT Education - 10/17/16 0855    Education provided Yes   Education Details Provided compression wrapping and input on getting rid of swelling.    Person(s) Educated Patient   Methods Explanation;Demonstration;Handout   Comprehension Verbalized understanding;Returned demonstration          PT Short Term Goals - 09/26/16 1058      PT SHORT TERM GOAL #1   Title Pt will increase knee extension ROM to -10 deg   Baseline -18 deg   Time 2   Period Weeks   Status New     PT SHORT TERM GOAL #2   Title Pt will increase knee extension ROM to  110 deg.   Baseline 40 deg -- to 106 on 3/12    Time 2   Period Weeks   Status Partially Met           PT Long Term Goals - 09/26/16 1059      PT LONG TERM GOAL #1   Title Pt will walk an 18 hole golf course without pain to return to recreational activities.    Baseline Has not tried golfing yet.    Time 6   Period Weeks   Status New     PT LONG TERM GOAL #2   Title Pt will walk 4-5 miles to return to recreational activities.    Baseline Has been able to    Time 6   Period Weeks   Status On-going     PT LONG TERM GOAL #3   Title Pt will score a 25 on the FGA to demonstrate independence and safety with mobility.    Time 6   Period Weeks   Status On-going     PT LONG TERM GOAL #4   Title  Pt will reach R knee extension of 0 deg to demonstrate optimal ROM for long term pain reduction.    Baseline -18 deg   Time 6   Period Weeks    Status On-going     PT LONG TERM GOAL #5   Title Pt will reach R knee flexion of 110 deg. to complete functional tasks such as stair ascent/descent.    Baseline 40 deg -- to 106 degrees    Time 6   Period Weeks   Status On-going               Plan - 10/17/16 0856    Clinical Impression Statement Patient continues to have high levels of swelling in the knee joint (up to 8cm of circumfrence increase on R side vs L side). Provided compression wrapping and educated on Tubi grip and Tedhoes at night to increase drainage to allow for increased motion at the knee. He is progressing well with quad strengthening, though notable for still having marked atrophy and decreased tone in R quad.    Rehab Potential Excellent   PT Frequency 2x / week   PT Duration 6 weeks   PT Treatment/Interventions ADLs/[place] Home Management;Electrical Stimulation;Therapeutic exercise;Passive range of motion;Patient/family education   PT Next Visit Plan Pt will work on PROM as well as pain modulation using e-stim. Quad activation will also be addressed using e-stim.   PT Home Exercise Plan Mini squats, TKEs, and knee flexion mobilizations.    Consulted and Agree with Plan of Care Patient;Family member/caregiver      Patient will benefit from skilled therapeutic intervention in order to improve the following deficits and impairments:  Abnormal gait, Pain, Decreased mobility, Decreased strength, Difficulty walking, Decreased range of motion, Increased edema  Visit Diagnosis: Chronic pain of right knee  Status post right knee replacement  Difficulty in walking, not elsewhere classified     Problem List Patient Active Problem List   Diagnosis Date Noted  . OA (osteoarthritis) of knee 08/15/2016  . 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 Personal history of diseases of skin or subcutaneous tissue 07/30/2015  . Essential thrombocytosis ([place]) 07/30/2015    Royce Macadamia PT, DPT, CSCS     10/17/2016, 1:30 PM  [facility] [address], [place], [address] Phone: [phone]   Fax:  [phone]  Name:  Anthony Graves MRN: [id number] Date of Birth: 10-28-1960

## 2016-10-21 ENCOUNTER — Encounter: Payer: BLUE CROSS/BLUE SHIELD | Admitting: Physical Therapy

## 2016-10-25 ENCOUNTER — Ambulatory Visit: Payer: BLUE CROSS/BLUE SHIELD | Admitting: Physical Therapy

## 2016-10-25 DIAGNOSIS — Z96651 Presence of right artificial knee joint: Secondary | ICD-10-CM | POA: Diagnosis not present

## 2016-10-25 DIAGNOSIS — R262 Difficulty in walking, not elsewhere classified: Secondary | ICD-10-CM

## 2016-10-25 DIAGNOSIS — M25561 Pain in right knee: Principal | ICD-10-CM

## 2016-10-25 DIAGNOSIS — G8929 Other chronic pain: Secondary | ICD-10-CM

## 2016-10-25 NOTE — Therapy (Signed)
[facility] [address] [place], [address] Phone: [phone]   Fax:  [phone]  Physical Therapy Treatment  Patient Details  Name: Anthony Graves MRN: [id number] Date of Birth: [birth year] No Data Recorded  Encounter Date: 10/25/2016      PT End of Session - 10/25/16 1029    Visit Number 18   Number of Visits 21   Date for PT Re-Evaluation 11/07/16   PT Start Time 0945   PT Stop Time 1026   PT Time Calculation (min) 41 min   Activity Tolerance Patient tolerated treatment well   Behavior During Therapy [facility] for tasks assessed/performed      Past Medical History:  Diagnosis Date  . Arthritis    osteoartiritis  . High platelet count ([place])     Past Surgical History:  Procedure Laterality Date  . COLONOSCOPY  2016  . CYSTECTOMY  years ago   pilonidal cyst removed  . KNEE SURGERY Right 20 years ago   arthrocscopy  . TOTAL KNEE ARTHROPLASTY Right 08/15/2016   Procedure: RIGHT TOTAL KNEE ARTHROPLASTY;  Surgeon: Gaynelle Arabian, MD;  Location: [facility];  Service: Orthopedics;  Laterality: Right;    There were no vitals filed for this visit.      Subjective Assessment - 10/25/16 0946    Subjective Patient reports he has been focusing on decreasing his swelling and has found a significant increase in his ROM.    Patient is accompained by: Family member   Limitations Lifting;Walking;Standing;House hold activities   Patient Stated Goals Pt reports he would like to return to tennis, golf, and 4-5 mile walks.   Currently in Pain? No/denies      Weightbearing ROM to 107 degrees initially after Nustep and 2nd step lunge stretching   Bike x 5 minutes (unbilled) -- able to complete full revolutions from roughly 2 spots in from full extension position on bike.   HS Curls 25# x 15 x 3 sets   Weighted squats (goblet) -- 20# x 15 (fairly easy, cuing to angle toes out) x 40# x 10 repetitions -- appears to be  too light, though restricted to just above parallel depth due to stiffness in R knee joint.   TRX Lunges x12 repetitions for 2 sets (able to bring knee to the floor bilaterally)   Prone Ely's stretch x 3 minutes (HS was spasming, performed isometric holds for autogenic inhibition with manual pressure applied to decrease tension/spasming). ROM continued to be around 106-107 degrees in lunge position on steps.                            PT Education - 10/25/16 1028    Education provided Yes   Education Details Progressing well with ROM, though continue to focus on decreasing swelling. Quadricep strength is returning nicely.    Person(s) Educated Patient   Methods Explanation;Demonstration   Comprehension Verbalized understanding;Returned demonstration          PT Short Term Goals - 09/26/16 1058      PT SHORT TERM GOAL #1   Title Pt will increase knee extension ROM to -10 deg   Baseline -18 deg   Time 2   Period Weeks   Status New     PT SHORT TERM GOAL #2   Title Pt will increase knee extension ROM to 110 deg.   Baseline 40 deg -- to 106 on 3/12  Time 2   Period Weeks   Status Partially Met           PT Long Term Goals - 09/26/16 1059      PT LONG TERM GOAL #1   Title Pt will walk an 18 hole golf course without pain to return to recreational activities.    Baseline Has not tried golfing yet.    Time 6   Period Weeks   Status New     PT LONG TERM GOAL #2   Title Pt will walk 4-5 miles to return to recreational activities.    Baseline Has been able to    Time 6   Period Weeks   Status On-going     PT LONG TERM GOAL #3   Title Pt will score a 25 on the FGA to demonstrate independence and safety with mobility.    Time 6   Period Weeks   Status On-going     PT LONG TERM GOAL #4   Title  Pt will reach R knee extension of 0 deg to demonstrate optimal ROM for long term pain reduction.    Baseline -18 deg   Time 6   Period Weeks   Status  On-going     PT LONG TERM GOAL #5   Title Pt will reach R knee flexion of 110 deg. to complete functional tasks such as stair ascent/descent.    Baseline 40 deg -- to 106 degrees    Time 6   Period Weeks   Status On-going               Plan - 10/25/16 1029    Clinical Impression Statement Patient demonstrates decrease in swelling around R knee joint and subsequent increase in ROM this date (up to 107 degrees). He is now able to make a full revolution on a recumbent bicycle, his quadricep strength is also improving nicely,. More definition noted in R quadricep than previous sessions. He is progressing well towards all mobility goals.    Rehab Potential Excellent   PT Frequency 2x / week   PT Duration 6 weeks   PT Treatment/Interventions ADLs/[place];Electrical Stimulation;Therapeutic exercise;Passive range of motion;Patient/family education   PT Next Visit Plan Pt will work on PROM as well as pain modulation using e-stim. Quad activation will also be addressed using e-stim.   PT Home Exercise Plan Mini squats, TKEs, and knee flexion mobilizations.    Consulted and Agree with Plan of Care Patient;Family member/caregiver      Patient will benefit from skilled therapeutic intervention in order to improve the following deficits and impairments:  Abnormal gait, Pain, Decreased mobility, Decreased strength, Difficulty walking, Decreased range of motion, Increased edema  Visit Diagnosis: Chronic pain of right knee  Status post right knee replacement  Difficulty in walking, not elsewhere classified     Problem List Patient Active Problem List   Diagnosis Date Noted  . OA (osteoarthritis) of knee 08/15/2016  . 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 Personal history of diseases of skin or subcutaneous tissue 07/30/2015  . Essential thrombocytosis ([place]) 07/30/2015   Royce Macadamia PT, DPT, CSCS    10/25/2016, 11:34 AM  [facility] [address] [place], [address] Phone: [phone]   Fax:  [phone]  Name: Dasani Crear Parfitt MRN: [id number] Date of Birth: 10-13-1960

## 2016-10-25 NOTE — Patient Instructions (Signed)
Weightbearing ROM to 107 degrees initially after Nustep and 2nd step lunge stretching   Bike x 5 minutes (unbilled)   HS Curls 25# x 15 x 3 sets   Weighted squats (goblet) -- 20# x 15 (fairly easy, cuing to angle toes out) x 40# x 10 repetitions   TRX Lunges x12 repetitions for 2 sets (able to bring knee to the floor bilaterally)   Prone Ely's stretch x 3 minutes (HS was spasming, performed

## 2016-10-26 ENCOUNTER — Ambulatory Visit: Payer: BLUE CROSS/BLUE SHIELD | Admitting: Physical Therapy

## 2016-10-28 ENCOUNTER — Ambulatory Visit: Payer: BLUE CROSS/BLUE SHIELD | Admitting: Physical Therapy

## 2016-10-31 DIAGNOSIS — E010 Iodine-deficiency related diffuse (endemic) goiter: Secondary | ICD-10-CM | POA: Diagnosis not present

## 2016-10-31 DIAGNOSIS — R946 Abnormal results of thyroid function studies: Secondary | ICD-10-CM | POA: Diagnosis not present

## 2016-11-01 ENCOUNTER — Ambulatory Visit: Payer: BLUE CROSS/BLUE SHIELD | Admitting: Physical Therapy

## 2016-11-01 DIAGNOSIS — Z96651 Presence of right artificial knee joint: Secondary | ICD-10-CM | POA: Diagnosis not present

## 2016-11-01 DIAGNOSIS — M25561 Pain in right knee: Principal | ICD-10-CM

## 2016-11-01 DIAGNOSIS — R262 Difficulty in walking, not elsewhere classified: Secondary | ICD-10-CM

## 2016-11-01 DIAGNOSIS — G8929 Other chronic pain: Secondary | ICD-10-CM

## 2016-11-01 NOTE — Patient Instructions (Addendum)
Standing 2nd step lunge to 105 degrees  Semi-recumbent bike x 4 minutes (unbilled)   Edge tool to distal quadriceps -- to 110 degrees   TRX lateral lunges x 12 repetitions   Rotational med ball throws to trampoline

## 2016-11-01 NOTE — Therapy (Signed)
[facility] [address] [place], [address] Phone: [phone]   Fax:  [phone]  Physical Therapy Treatment  Patient Details  Name: Anthony Graves MRN: [id number] Date of Birth: [birth year] No Data Recorded  Encounter Date: 11/01/2016      PT End of Session - 11/01/16 0942    Visit Number 19   Number of Visits 21   Date for PT Re-Evaluation 11/07/16   PT Start Time 0906   PT Stop Time 0942   PT Time Calculation (min) 36 min   Activity Tolerance Patient tolerated treatment well   Behavior During Therapy [facility] for tasks assessed/performed      Past Medical History:  Diagnosis Date  . Arthritis    osteoartiritis  . 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 COLONOSCOPY  2016  . CYSTECTOMY  years ago   pilonidal cyst removed  . KNEE SURGERY Right 20 years ago   arthrocscopy  . TOTAL KNEE ARTHROPLASTY Right 08/15/2016   Procedure: RIGHT TOTAL KNEE ARTHROPLASTY;  Surgeon: Gaynelle Arabian, MD;  Location: [facility];  Service: Orthopedics;  Laterality: Right;    There were no vitals filed for this visit.      Subjective Assessment - 11/01/16 0901    Subjective Patient reports he traveled for the first time last week. Reports he has been trying to manage the swelling (especially with standing on his feet for any length of time). He reports his hamstring continues to get tight (and thus limits his ROM).    Patient is accompained by: Family member   Limitations Lifting;Walking;Standing;House hold activities   Patient Stated Goals Pt reports he would like to return to tennis, golf, and 4-5 mile walks.   Currently in Pain? No/denies       Standing 2nd step lunge to 105 degrees -- multiple repetitions observed and measured   Semi-recumbent bike x 4 minutes (unbilled)   Edge tool to distal quadriceps -- to 110 degrees on weightbearing lunge stretch after completion  (for a total of 8 minutes, well tolerated, mild increase in erythema-- appropriate)  TRX lateral lunges x 12 repetitions -- appropriate technique and range of motion noted  Rotational med ball throws to trampoline with 3 kg medicine ball x 12 repetitions (cuing to flex more through R knee and load R leg (performed with R leg back) -- reports no increase in pain, confidence in technique   Observed golf swing, appropriate follow through, back swing, patient reports he felt confident in his ROM and completion.                           PT Education - 11/01/16 1102    Education provided Yes   Education Details Can start golfing, using recumbent/semi-recumbent bikes, follow up in 1 month.    Person(s) Educated Patient   Methods Explanation;Demonstration;Handout   Comprehension Verbalized understanding;Returned demonstration          PT Short Term Goals - 09/26/16 1058      PT SHORT TERM GOAL #1   Title Pt will increase knee extension ROM to -10 deg   Baseline -18 deg   Time 2   Period Weeks   Status New     PT SHORT TERM GOAL #2   Title Pt will increase knee extension ROM to 110 deg.   Baseline 40 deg -- to  106 on 3/12    Time 2   Period Weeks   Status Partially Met           PT Long Term Goals - 09/26/16 1059      PT LONG TERM GOAL #1   Title Pt will walk an 18 hole golf course without pain to return to recreational activities.    Baseline Has not tried golfing yet.    Time 6   Period Weeks   Status New     PT LONG TERM GOAL #2   Title Pt will walk 4-5 miles to return to recreational activities.    Baseline Has been able to    Time 6   Period Weeks   Status On-going     PT LONG TERM GOAL #3   Title Pt will score a 25 on the FGA to demonstrate independence and safety with mobility.    Time 6   Period Weeks   Status On-going     PT LONG TERM GOAL #4   Title  Pt will reach R knee extension of 0 deg to demonstrate optimal ROM for long term  pain reduction.    Baseline -18 deg   Time 6   Period Weeks   Status On-going     PT LONG TERM GOAL #5   Title Pt will reach R knee flexion of 110 deg. to complete functional tasks such as stair ascent/descent.    Baseline 40 deg -- to 106 degrees    Time 6   Period Weeks   Status On-going               Plan - 11/01/16 0948    Clinical Impression Statement Patient has met ROM goal of 110 degrees, his quadricep strength has improved significantly and his ability to [place] manage swelling is appropriate. He is able to swing a golf club in clinic today with no increase in pain. Given his overall presentation, recommend follow up in 1 month for discharge, return to recreational activities as tolerated.    Rehab Potential Excellent   PT Frequency 2x / week   PT Duration 6 weeks   PT Treatment/Interventions ADLs/[place] Care Home Management;Electrical Stimulation;Therapeutic exercise;Passive range of motion;Patient/family education   PT Next Visit Plan Pt will work on PROM as well as pain modulation using e-stim. Quad activation will also be addressed using e-stim.   PT Home Exercise Plan Mini squats, TKEs, and knee flexion mobilizations.    Consulted and Agree with Plan of Care Patient;Family member/caregiver      Patient will benefit from skilled therapeutic intervention in order to improve the following deficits and impairments:  Abnormal gait, Pain, Decreased mobility, Decreased strength, Difficulty walking, Decreased range of motion, Increased edema  Visit Diagnosis: Chronic pain of right knee  Status post right knee replacement  Difficulty in walking, not elsewhere classified     Problem List Patient Active Problem List   Diagnosis Date Noted  . OA (osteoarthritis) of knee 08/15/2016  . 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 Personal history of diseases of skin or subcutaneous tissue 07/30/2015  . Essential thrombocytosis ([place]) 07/30/2015   Royce Macadamia PT,  DPT, CSCS    11/01/2016, 11:04 AM  [facility] [address] [place], [address] Phone: [phone]   Fax:  [phone]  Name: Anthony Graves MRN: [id number] Date of Birth: 1961-03-28

## 2016-11-24 DIAGNOSIS — E010 Iodine-deficiency related diffuse (endemic) goiter: Secondary | ICD-10-CM | POA: Diagnosis not present

## 2016-11-30 ENCOUNTER — Encounter: Payer: Self-pay | Admitting: Family Medicine

## 2017-02-21 DIAGNOSIS — Z1283 Encounter for screening for malignant neoplasm of skin: Secondary | ICD-10-CM | POA: Diagnosis not present

## 2017-02-21 DIAGNOSIS — D485 Neoplasm of uncertain behavior of skin: Secondary | ICD-10-CM | POA: Diagnosis not present

## 2017-02-21 DIAGNOSIS — L578 Other skin changes due to chronic exposure to nonionizing radiation: Secondary | ICD-10-CM | POA: Diagnosis not present

## 2017-02-21 DIAGNOSIS — L821 Other seborrheic keratosis: Secondary | ICD-10-CM | POA: Diagnosis not present

## 2017-04-20 ENCOUNTER — Other Ambulatory Visit: Payer: Self-pay | Admitting: Internal Medicine

## 2017-04-20 DIAGNOSIS — D473 Essential (hemorrhagic) thrombocythemia: Secondary | ICD-10-CM

## 2017-04-24 DIAGNOSIS — G43109 Migraine with aura, not intractable, without status migrainosus: Secondary | ICD-10-CM | POA: Diagnosis not present

## 2017-05-31 ENCOUNTER — Other Ambulatory Visit: Payer: BLUE CROSS/BLUE SHIELD

## 2017-05-31 ENCOUNTER — Ambulatory Visit: Payer: BLUE CROSS/BLUE SHIELD | Admitting: Internal Medicine

## 2017-06-13 ENCOUNTER — Other Ambulatory Visit: Payer: Self-pay | Admitting: Internal Medicine

## 2017-06-13 DIAGNOSIS — D473 Essential (hemorrhagic) thrombocythemia: Secondary | ICD-10-CM

## 2017-06-14 ENCOUNTER — Inpatient Hospital Stay (HOSPITAL_BASED_OUTPATIENT_CLINIC_OR_DEPARTMENT_OTHER): Payer: BLUE CROSS/BLUE SHIELD | Admitting: Internal Medicine

## 2017-06-14 ENCOUNTER — Inpatient Hospital Stay: Payer: BLUE CROSS/BLUE SHIELD | Attending: Internal Medicine

## 2017-06-14 ENCOUNTER — Encounter: Payer: Self-pay | Admitting: Internal Medicine

## 2017-06-14 ENCOUNTER — Other Ambulatory Visit: Payer: Self-pay

## 2017-06-14 VITALS — BP 134/76 | HR 56 | Temp 97.8°F | Resp 20 | Ht 70.0 in | Wt 181.0 lb

## 2017-06-14 DIAGNOSIS — Z79899 Other long term (current) drug therapy: Secondary | ICD-10-CM | POA: Insufficient documentation

## 2017-06-14 DIAGNOSIS — Z7982 Long term (current) use of aspirin: Secondary | ICD-10-CM | POA: Insufficient documentation

## 2017-06-14 DIAGNOSIS — M1990 Unspecified osteoarthritis, unspecified site: Secondary | ICD-10-CM | POA: Insufficient documentation

## 2017-06-14 DIAGNOSIS — D473 Essential (hemorrhagic) thrombocythemia: Secondary | ICD-10-CM | POA: Insufficient documentation

## 2017-06-14 LAB — CBC WITH DIFFERENTIAL/PLATELET
BASOS ABS: 0.1 10*3/uL (ref 0–0.1)
BASOS PCT: 1 %
EOS ABS: 0.3 10*3/uL (ref 0–0.7)
Eosinophils Relative: 3 %
HEMATOCRIT: 41.8 % (ref 40.0–52.0)
Hemoglobin: 13.6 g/dL (ref 13.0–18.0)
Lymphocytes Relative: 19 %
Lymphs Abs: 2.3 10*3/uL (ref 1.0–3.6)
MCH: 29.3 pg (ref 26.0–34.0)
MCHC: 32.6 g/dL (ref 32.0–36.0)
MCV: 89.7 fL (ref 80.0–100.0)
MONO ABS: 0.8 10*3/uL (ref 0.2–1.0)
Monocytes Relative: 6 %
NEUTROS ABS: 8.6 10*3/uL — AB (ref 1.4–6.5)
Neutrophils Relative %: 71 %
PLATELETS: 586 10*3/uL — AB (ref 150–440)
RBC: 4.66 MIL/uL (ref 4.40–5.90)
RDW: 14.5 % (ref 11.5–14.5)
WBC: 12.1 10*3/uL — ABNORMAL HIGH (ref 3.8–10.6)

## 2017-06-14 LAB — COMPREHENSIVE METABOLIC PANEL
ALT: 14 U/L — AB (ref 17–63)
AST: 24 U/L (ref 15–41)
Albumin: 4.1 g/dL (ref 3.5–5.0)
Alkaline Phosphatase: 49 U/L (ref 38–126)
Anion gap: 9 (ref 5–15)
BILIRUBIN TOTAL: 0.7 mg/dL (ref 0.3–1.2)
BUN: 26 mg/dL — ABNORMAL HIGH (ref 6–20)
CALCIUM: 9.3 mg/dL (ref 8.9–10.3)
CHLORIDE: 103 mmol/L (ref 101–111)
CO2: 27 mmol/L (ref 22–32)
CREATININE: 1.09 mg/dL (ref 0.61–1.24)
GLUCOSE: 91 mg/dL (ref 65–99)
Potassium: 5 mmol/L (ref 3.5–5.1)
Sodium: 139 mmol/L (ref 135–145)
TOTAL PROTEIN: 6.9 g/dL (ref 6.5–8.1)

## 2017-06-14 NOTE — Assessment & Plan Note (Addendum)
#   Essential thrombocytosis-LOW risk; [positive for CALR mutation] no complications noted. platelets today 583stable on anagrelide 1.5 mg a day. Discussed re: CALR/ good prognosis.  Continue the same.   # Follow-up in 12 months CBC CMP.  Cc:   Dr. Rosanna Randy.

## 2017-06-14 NOTE — Progress Notes (Signed)
Anthony Graves OFFICE PROGRESS NOTE  Patient Care Team: Jerrol Banana., MD as PCP - General (Family Medicine)   SUMMARY OF HEMATOLOGIC/ONCOLOGIC HISTORY: Oncology History   # 2005- Essential thrombocythemia [Bone marrow study on December 24, 2003 showed increased enlarged megakaryocytes, cellularity 50%, consistent with ET [Dr.Pandit]; Cytogenetics, flow study nd BCR/ABL study negative.January 2007: [id number] mutation negative]On Anagrelide therapy Asa 81mg /d; CALR MUTATION POSITIVE [Nov 2017];      Essential thrombocytosis ([facility])       INTERVAL HISTORY:  A very pleasant 56 year old male patient with above history of essential thrombocytosis currently on anagrelide is here for follow-up.  Currently status post knee replacement.  Postoperative course uneventful.   Patient denies any unusual burning in fingertips and toes. Appetite is good. No nausea no vomiting. Denies any weight loss. Denies any chest pain or shortness of breath.   REVIEW OF SYSTEMS:  A complete 10 point review of system is done which is negative except mentioned above/history of present illness.   PAST MEDICAL HISTORY :  Past Medical History:  Diagnosis Date  . Arthritis    osteoartiritis  . High platelet count     PAST SURGICAL HISTORY :   Past Surgical History:  Procedure Laterality Date  . COLONOSCOPY  2016  . CYSTECTOMY  years ago   pilonidal cyst removed  . KNEE SURGERY Right 20 years ago   arthrocscopy  . TOTAL KNEE ARTHROPLASTY Right 08/15/2016   Procedure: RIGHT TOTAL KNEE ARTHROPLASTY;  Surgeon: Gaynelle Arabian, MD;  Location: [facility];  Service: Orthopedics;  Laterality: Right;    FAMILY HISTORY :  History reviewed. No pertinent family history.  SOCIAL HISTORY:   Social History   Tobacco Use  . Smoking status: Never Smoker  . Smokeless tobacco: Never Used  Substance Use Topics  . Alcohol use: Yes    Alcohol/week: 1.8 - 4.2 oz    Types: 3 - 7 Glasses of wine per week  .  Drug use: No    ALLERGIES:  is allergic to penicillins.  MEDICATIONS:  Current Outpatient Medications  Medication Sig Dispense Refill  . anagrelide (AGRYLIN) 0.5 MG capsule TAKE 2 CAPSULES BY MOUTH IN THE MORNING AND 1 CAPSULE IN THE EVENING 270 capsule 2  . aspirin EC 81 MG tablet Take 1 tablet by mouth daily.     No current facility-administered medications for this visit.     PHYSICAL EXAMINATION: ECOG PERFORMANCE STATUS: 0 - Asymptomatic  BP 134/76 (BP Location: Left Arm, Patient Position: Sitting)   Pulse (!) 56   Temp 97.8 F (36.6 C) (Tympanic)   Resp 20   Ht 5\' 10"  (1.778 m)   Wt 181 lb (82.1 kg)   BMI 25.97 kg/m   Filed Weights   06/14/17 1030  Weight: 181 lb (82.1 kg)    GENERAL: Well-nourished well-developed; Alert, no distress and comfortable.  Alone. EYES: no pallor or icterus OROPHARYNX: no thrush or ulceration; good dentition  NECK: supple, no masses felt LYMPH:  no palpable lymphadenopathy in the cervical, axillary or inguinal regions LUNGS: clear to auscultation and  No wheeze or crackles HEART/CVS: regular rate & rhythm and no murmurs; No lower extremity edema ABDOMEN:abdomen soft, non-tender and normal bowel sounds Musculoskeletal:no cyanosis of digits and no clubbing  PSYCH: alert & oriented x 3 with fluent speech NEURO: no focal motor/sensory deficits SKIN:  no rashes or significant lesions  LABORATORY DATA:  I have reviewed the data as listed    Component Value Date/Time  NA 139 06/14/2017 0939   NA 143 07/29/2016 0844   K 5.0 06/14/2017 0939   CL 103 06/14/2017 0939   CO2 27 06/14/2017 0939   GLUCOSE 91 06/14/2017 0939   BUN 26 (H) 06/14/2017 0939   BUN 20 07/29/2016 0844   CREATININE 1.09 06/14/2017 0939   CREATININE 1.10 04/25/2014 0823   CALCIUM 9.3 06/14/2017 0939   PROT 6.9 06/14/2017 0939   PROT 6.7 07/29/2016 0844   PROT 6.7 04/25/2014 0823   ALBUMIN 4.1 06/14/2017 0939   ALBUMIN 4.5 07/29/2016 0844   ALBUMIN 3.8  04/25/2014 0823   AST 24 06/14/2017 0939   AST 22 04/25/2014 0823   ALT 14 (L) 06/14/2017 0939   ALT 26 04/25/2014 0823   ALKPHOS 49 06/14/2017 0939   ALKPHOS 44 (L) 04/25/2014 0823   BILITOT 0.7 06/14/2017 0939   BILITOT 0.6 07/29/2016 0844   BILITOT 0.6 04/25/2014 0823   GFRNONAA >60 06/14/2017 0939   GFRNONAA >60 04/25/2014 0823   GFRNONAA >60 02/04/2014 0830   GFRAA >60 06/14/2017 0939   GFRAA >60 04/25/2014 0823   GFRAA >60 02/04/2014 0830    No results found for: SPEP, UPEP  Lab Results  Component Value Date   WBC 12.1 (H) 06/14/2017   NEUTROABS 8.6 (H) 06/14/2017   HGB 13.6 06/14/2017   HCT 41.8 06/14/2017   MCV 89.7 06/14/2017   PLT 586 (H) 06/14/2017      Chemistry      Component Value Date/Time   NA 139 06/14/2017 0939   NA 143 07/29/2016 0844   K 5.0 06/14/2017 0939   CL 103 06/14/2017 0939   CO2 27 06/14/2017 0939   BUN 26 (H) 06/14/2017 0939   BUN 20 07/29/2016 0844   CREATININE 1.09 06/14/2017 0939   CREATININE 1.10 04/25/2014 0823   GLU 89 01/01/2014      Component Value Date/Time   CALCIUM 9.3 06/14/2017 0939   ALKPHOS 49 06/14/2017 0939   ALKPHOS 44 (L) 04/25/2014 0823   AST 24 06/14/2017 0939   AST 22 04/25/2014 0823   ALT 14 (L) 06/14/2017 0939   ALT 26 04/25/2014 0823   BILITOT 0.7 06/14/2017 0939   BILITOT 0.6 07/29/2016 0844   BILITOT 0.6 04/25/2014 0823         ASSESSMENT & PLAN:   Essential thrombocytosis ([place]) # Essential thrombocytosis-LOW risk; [positive for CALR mutation] no complications noted. platelets today 583stable on anagrelide 1.5 mg a day. Discussed re: CALR/ good prognosis.  Continue the same.   # Follow-up in 12 months CBC CMP.  Cc:   Dr. Rosanna Randy.     Cammie Sickle, MD 06/15/2017 6:49 PM

## 2017-07-07 DIAGNOSIS — Z96651 Presence of right artificial knee joint: Secondary | ICD-10-CM | POA: Diagnosis not present

## 2017-07-07 DIAGNOSIS — Z471 Aftercare following joint replacement surgery: Secondary | ICD-10-CM | POA: Diagnosis not present

## 2017-08-24 DIAGNOSIS — D229 Melanocytic nevi, unspecified: Secondary | ICD-10-CM | POA: Diagnosis not present

## 2017-08-24 DIAGNOSIS — L812 Freckles: Secondary | ICD-10-CM | POA: Diagnosis not present

## 2017-08-24 DIAGNOSIS — L570 Actinic keratosis: Secondary | ICD-10-CM | POA: Diagnosis not present

## 2017-08-24 DIAGNOSIS — Z1283 Encounter for screening for malignant neoplasm of skin: Secondary | ICD-10-CM | POA: Diagnosis not present

## 2017-08-24 DIAGNOSIS — Z85828 Personal history of other malignant neoplasm of skin: Secondary | ICD-10-CM | POA: Diagnosis not present

## 2017-08-24 DIAGNOSIS — I788 Other diseases of capillaries: Secondary | ICD-10-CM | POA: Diagnosis not present

## 2017-08-24 DIAGNOSIS — L820 Inflamed seborrheic keratosis: Secondary | ICD-10-CM | POA: Diagnosis not present

## 2017-08-24 DIAGNOSIS — D485 Neoplasm of uncertain behavior of skin: Secondary | ICD-10-CM | POA: Diagnosis not present

## 2017-10-23 ENCOUNTER — Other Ambulatory Visit: Payer: Self-pay | Admitting: Internal Medicine

## 2017-10-23 DIAGNOSIS — D473 Essential (hemorrhagic) thrombocythemia: Secondary | ICD-10-CM

## 2018-03-15 DIAGNOSIS — L821 Other seborrheic keratosis: Secondary | ICD-10-CM | POA: Diagnosis not present

## 2018-03-15 DIAGNOSIS — Z85828 Personal history of other malignant neoplasm of skin: Secondary | ICD-10-CM | POA: Diagnosis not present

## 2018-03-15 DIAGNOSIS — D485 Neoplasm of uncertain behavior of skin: Secondary | ICD-10-CM | POA: Diagnosis not present

## 2018-03-15 DIAGNOSIS — L820 Inflamed seborrheic keratosis: Secondary | ICD-10-CM | POA: Diagnosis not present

## 2018-03-15 DIAGNOSIS — Z1283 Encounter for screening for malignant neoplasm of skin: Secondary | ICD-10-CM | POA: Diagnosis not present

## 2018-04-30 ENCOUNTER — Other Ambulatory Visit: Payer: Self-pay | Admitting: Internal Medicine

## 2018-04-30 DIAGNOSIS — D473 Essential (hemorrhagic) thrombocythemia: Secondary | ICD-10-CM

## 2018-04-30 NOTE — Telephone Encounter (Signed)
Patient next appointment 06/18/18 on 12 month follow up  )   Ref Range & Units 92mo ago  WBC 3.8 - 10.6 K/uL 12.1High    RBC 4.40 - 5.90 MIL/uL 4.66   Hemoglobin 13.0 - 18.0 g/dL 13.6   HCT 40.0 - 52.0 % 41.8   MCV 80.0 - 100.0 fL 89.7   MCH 26.0 - 34.0 pg 29.3   MCHC 32.0 - 36.0 g/dL 32.6   RDW 11.5 - 14.5 % 14.5   Platelets 150 - 440 K/uL 586High    Neutrophils Relative % % 71   Neutro Abs 1.4 - 6.5 K/uL 8.6High    Lymphocytes Relative % 19   Lymphs Abs 1.0 - 3.6 K/uL 2.3   Monocytes Relative % 6   Monocytes Absolute 0.2 - 1.0 K/uL 0.8   Eosinophils Relative % 3   Eosinophils Absolute 0 - 0.7 K/uL 0.3   Basophils Relative % 1   Basophils Absolute 0 - 0.1 K/uL 0.1   Resulting Agency  [facility]      Specimen Collected: 06/14/17 09:39 Last Resulted: 06/14/17 09:44

## 2018-06-15 ENCOUNTER — Ambulatory Visit: Payer: BLUE CROSS/BLUE SHIELD | Admitting: Internal Medicine

## 2018-06-15 ENCOUNTER — Other Ambulatory Visit: Payer: BLUE CROSS/BLUE SHIELD

## 2018-06-18 ENCOUNTER — Inpatient Hospital Stay: Payer: BLUE CROSS/BLUE SHIELD | Attending: Internal Medicine

## 2018-06-18 ENCOUNTER — Other Ambulatory Visit: Payer: Self-pay | Admitting: *Deleted

## 2018-06-18 ENCOUNTER — Other Ambulatory Visit: Payer: Self-pay

## 2018-06-18 ENCOUNTER — Inpatient Hospital Stay (HOSPITAL_BASED_OUTPATIENT_CLINIC_OR_DEPARTMENT_OTHER): Payer: BLUE CROSS/BLUE SHIELD | Admitting: Internal Medicine

## 2018-06-18 VITALS — BP 144/81 | HR 62 | Resp 16 | Wt 176.0 lb

## 2018-06-18 DIAGNOSIS — D473 Essential (hemorrhagic) thrombocythemia: Secondary | ICD-10-CM

## 2018-06-18 LAB — COMPREHENSIVE METABOLIC PANEL
ALBUMIN: 4.1 g/dL (ref 3.5–5.0)
ALT: 17 U/L (ref 0–44)
AST: 27 U/L (ref 15–41)
Alkaline Phosphatase: 38 U/L (ref 38–126)
Anion gap: 6 (ref 5–15)
BUN: 24 mg/dL — AB (ref 6–20)
CHLORIDE: 104 mmol/L (ref 98–111)
CO2: 30 mmol/L (ref 22–32)
CREATININE: 1.1 mg/dL (ref 0.61–1.24)
Calcium: 9.2 mg/dL (ref 8.9–10.3)
GFR calc Af Amer: >60 mL/min (ref >60)
GFR calc non Af Amer: >60 mL/min (ref >60)
Glucose, Bld: 99 mg/dL (ref 70–99)
POTASSIUM: 4.3 mmol/L (ref 3.5–5.1)
SODIUM: 140 mmol/L (ref 135–145)
Total Bilirubin: 0.9 mg/dL (ref 0.3–1.2)
Total Protein: 6.7 g/dL (ref 6.5–8.1)

## 2018-06-18 LAB — CBC WITH DIFFERENTIAL/PLATELET
ABS IMMATURE GRANULOCYTES: 0.44 10*3/uL — AB (ref 0.00–0.07)
BASOS ABS: 0.1 10*3/uL (ref 0.0–0.1)
Basophils Relative: 1 %
EOS PCT: 0 %
Eosinophils Absolute: 0 10*3/uL (ref 0.0–0.5)
HEMATOCRIT: 41.4 % (ref 39.0–52.0)
Hemoglobin: 12.7 g/dL — ABNORMAL LOW (ref 13.0–17.0)
Immature Granulocytes: 4 %
LYMPHS ABS: 2.1 10*3/uL (ref 0.7–4.0)
LYMPHS PCT: 17 %
MCH: 29 pg (ref 26.0–34.0)
MCHC: 30.7 g/dL (ref 30.0–36.0)
MCV: 94.5 fL (ref 80.0–100.0)
MONO ABS: 0.9 10*3/uL (ref 0.1–1.0)
Monocytes Relative: 8 %
NRBC: 0 % (ref 0.0–0.2)
Neutro Abs: 8.3 10*3/uL — ABNORMAL HIGH (ref 1.7–7.7)
Neutrophils Relative %: 70 %
Platelets: 394 10*3/uL (ref 150–400)
RBC: 4.38 MIL/uL (ref 4.22–5.81)
RDW: 13.7 % (ref 11.5–15.5)
WBC: 11.8 10*3/uL — ABNORMAL HIGH (ref 4.0–10.5)

## 2018-06-18 NOTE — Assessment & Plan Note (Addendum)
#   Essential thrombocytosis-LOW risk; [positive for CALR mutation] no complications noted. platelets today 394; stable on anagrelide 1.5 mg a day.  Discussed re: CALR/ good prognosis.  Continue the same.   # DISPOSITION:  # Follow-up in 12 months CBC CMP/LDH- Dr.B  Cc:   Dr. Rosanna Randy.

## 2018-06-18 NOTE — Progress Notes (Signed)
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  Appetite is good.  No weight loss.  No nausea vomiting.  No night sweats.  No fatigue.  Review of Systems  Constitutional: Negative for chills, diaphoresis, fever, malaise/fatigue and weight loss.  HENT: Negative for nosebleeds and sore throat.   Eyes: Negative for double vision.  Respiratory: Negative for cough, hemoptysis, sputum production, shortness of breath and wheezing.   Cardiovascular: Negative for chest pain, palpitations, orthopnea and leg swelling.  Gastrointestinal: Negative for abdominal pain, blood in stool, constipation, diarrhea, heartburn, melena, nausea and vomiting.  Genitourinary: Negative for dysuria, frequency and urgency.  Musculoskeletal: Negative for back pain and joint pain.  Skin: Negative.  Negative for itching and rash.  Neurological: Negative for dizziness, tingling, focal weakness, weakness and headaches.  Endo/Heme/Allergies: Does not bruise/bleed easily.  Psychiatric/Behavioral: Negative for depression. The patient is not nervous/anxious and does not have insomnia.      PAST MEDICAL HISTORY :  Past Medical History:  Diagnosis Date  .  Arthritis    osteoartiritis  . High platelet count     PAST SURGICAL HISTORY :   Past Surgical History:  Procedure Laterality Date  . COLONOSCOPY  2016  . CYSTECTOMY  years ago   pilonidal cyst removed  . KNEE SURGERY Right 20 years ago   arthrocscopy  . TOTAL KNEE ARTHROPLASTY Right 08/15/2016   Procedure: RIGHT TOTAL KNEE ARTHROPLASTY;  Surgeon: Gaynelle Arabian, MD;  Location: [facility];  Service: Orthopedics;  Laterality: Right;    FAMILY HISTORY :  No family history on file.  SOCIAL HISTORY:   Social History   Tobacco Use  . Smoking status: Never Smoker  . Smokeless tobacco: Never Used  Substance Use Topics  . Alcohol use: Yes    Alcohol/week: 3.0 - 7.0 standard drinks    Types: 3 - 7 Glasses of wine per week  . Drug use: No    ALLERGIES:  is allergic to penicillins.  MEDICATIONS:  Current Outpatient Medications  Medication Sig Dispense Refill  . anagrelide (AGRYLIN) 0.5 MG capsule TAKE TWO CAPSULES EACH MORNING AND ONE CAPSULE EACH EVENING 270 capsule 1  . aspirin EC 81 MG tablet Take 1 tablet by mouth daily.     No current facility-administered medications for this visit.     PHYSICAL EXAMINATION: ECOG PERFORMANCE STATUS: 0 - Asymptomatic  BP (!) 144/81 (BP Location: Left Arm, Patient Position: Sitting)   Pulse 62   Resp 16   Wt 176 lb (79.8 kg)   BMI 25.25 kg/m   Filed Weights   06/18/18 1014  Weight: 176 lb (79.8 kg)    Physical Exam  Constitutional: He is oriented to person, place, and time and well-developed, well-nourished, and in no distress.  HENT:  Head: Normocephalic  and atraumatic.  Mouth/Throat: Oropharynx is clear and moist. No oropharyngeal exudate.  Eyes: Pupils are equal, round, and reactive to light.  Neck: Normal range of motion. Neck supple.  Cardiovascular: Normal rate and regular rhythm.  Pulmonary/Chest: Breath sounds normal. No respiratory distress. He has no wheezes.  Abdominal: Soft. Bowel sounds are normal. He exhibits no  distension and no mass. There is no tenderness. There is no rebound and no guarding.  Musculoskeletal: Normal range of motion. He exhibits no edema or tenderness.  Neurological: He is alert and oriented to person, place, and time.  Skin: Skin is warm.  Psychiatric: Affect normal.    LABORATORY DATA:  I have reviewed the data as listed    Component Value Date/Time   NA 140 06/18/2018 0935   NA 143 07/29/2016 0844   K 4.3 06/18/2018 0935   CL 104 06/18/2018 0935   CO2 30 06/18/2018 0935   GLUCOSE 99 06/18/2018 0935   BUN 24 (H) 06/18/2018 0935   BUN 20 07/29/2016 0844   CREATININE 1.10 06/18/2018 0935   CREATININE 1.10 04/25/2014 0823   CALCIUM 9.2 06/18/2018 0935   PROT 6.7 06/18/2018 0935   PROT 6.7 07/29/2016 0844   PROT 6.7 04/25/2014 0823   ALBUMIN 4.1 06/18/2018 0935   ALBUMIN 4.5 07/29/2016 0844   ALBUMIN 3.8 04/25/2014 0823   AST 27 06/18/2018 0935   AST 22 04/25/2014 0823   ALT 17 06/18/2018 0935   ALT 26 04/25/2014 0823   ALKPHOS 38 06/18/2018 0935   ALKPHOS 44 (L) 04/25/2014 0823   BILITOT 0.9 06/18/2018 0935   BILITOT 0.6 07/29/2016 0844   BILITOT 0.6 04/25/2014 0823   GFRNONAA >60 06/18/2018 0935   GFRNONAA >60 04/25/2014 0823   GFRNONAA >60 02/04/2014 0830   GFRAA >60 06/18/2018 0935   GFRAA >60 04/25/2014 0823   GFRAA >60 02/04/2014 0830    No results found for: SPEP, UPEP  Lab Results  Component Value Date   WBC 11.8 (H) 06/18/2018   NEUTROABS 8.3 (H) 06/18/2018   HGB 12.7 (L) 06/18/2018   HCT 41.4 06/18/2018   MCV 94.5 06/18/2018   PLT 394 06/18/2018      Chemistry      Component Value Date/Time   NA 140 06/18/2018 0935   NA 143 07/29/2016 0844   K 4.3 06/18/2018 0935   CL 104 06/18/2018 0935   CO2 30 06/18/2018 0935   BUN 24 (H) 06/18/2018 0935   BUN 20 07/29/2016 0844   CREATININE 1.10 06/18/2018 0935   CREATININE 1.10 04/25/2014 0823   GLU 89 01/01/2014      Component Value Date/Time   CALCIUM 9.2 06/18/2018 0935   ALKPHOS 38  06/18/2018 0935   ALKPHOS 44 (L) 04/25/2014 0823   AST 27 06/18/2018 0935   AST 22 04/25/2014 0823   ALT 17 06/18/2018 0935   ALT 26 04/25/2014 0823   BILITOT 0.9 06/18/2018 0935   BILITOT 0.6 07/29/2016 0844   BILITOT 0.6 04/25/2014 0823         ASSESSMENT & PLAN:   Essential thrombocytosis ([place]) # Essential thrombocytosis-LOW risk; [positive for CALR mutation] no complications noted. platelets today 394; stable on anagrelide 1.5 mg a day.  Discussed re: CALR/ good prognosis.  Continue the same.   # DISPOSITION:  # Follow-up in 12 months CBC CMP/LDH- Dr.B  Cc:   Dr. Rosanna Randy.     Cammie Sickle, MD 06/18/2018 4:55 PM

## 2018-07-31 ENCOUNTER — Encounter: Payer: Self-pay | Admitting: Internal Medicine

## 2018-08-01 ENCOUNTER — Other Ambulatory Visit: Payer: Self-pay | Admitting: *Deleted

## 2018-08-01 DIAGNOSIS — D473 Essential (hemorrhagic) thrombocythemia: Secondary | ICD-10-CM

## 2018-08-01 MED ORDER — ANAGRELIDE HCL 1 MG PO CAPS: 1.0000 mg | ORAL_CAPSULE | Freq: Two times a day (BID) | ORAL | 2 refills | Status: DC

## 2018-08-15 ENCOUNTER — Encounter: Payer: Self-pay | Admitting: Internal Medicine

## 2018-08-17 ENCOUNTER — Other Ambulatory Visit: Payer: Self-pay | Admitting: Internal Medicine

## 2018-08-17 ENCOUNTER — Other Ambulatory Visit: Payer: Self-pay

## 2018-08-17 MED ORDER — ANAGRELIDE HCL 0.5 MG PO CAPS: 0.5000 mg | ORAL_CAPSULE | Freq: Every day | ORAL | 1 refills | Status: DC

## 2018-08-17 NOTE — Progress Notes (Signed)
Patient needs Agrylin 0.5mg  sent to [facility] mail order pharmacy. Rx has been sent. Thanks!

## 2018-08-17 NOTE — Progress Notes (Signed)
Anthony Graves- Call pt to find out the pharmacy

## 2018-09-18 DIAGNOSIS — Z1283 Encounter for screening for malignant neoplasm of skin: Secondary | ICD-10-CM | POA: Diagnosis not present

## 2018-09-18 DIAGNOSIS — D485 Neoplasm of uncertain behavior of skin: Secondary | ICD-10-CM | POA: Diagnosis not present

## 2018-09-18 DIAGNOSIS — Z85828 Personal history of other malignant neoplasm of skin: Secondary | ICD-10-CM | POA: Diagnosis not present

## 2018-09-18 DIAGNOSIS — L820 Inflamed seborrheic keratosis: Secondary | ICD-10-CM | POA: Diagnosis not present

## 2018-09-18 DIAGNOSIS — L578 Other skin changes due to chronic exposure to nonionizing radiation: Secondary | ICD-10-CM | POA: Diagnosis not present

## 2018-10-15 ENCOUNTER — Other Ambulatory Visit: Payer: Self-pay | Admitting: Internal Medicine

## 2018-10-15 NOTE — Telephone Encounter (Signed)
)   Ref Range & Units 30mo ago (06/18/18) 64yr ago (06/14/17) 17yr ago (08/16/16) 19yr ago (07/29/16) 28yr ago (05/30/16) 21yr ago (11/25/15) 68yr ago (10/23/15)  WBC 4.0 - 10.5 K/uL 11.8High   12.1High  R 24.6High   11.6High  R 11.9High  R 10.4 R 10.8High  R  RBC 4.22 - 5.81 MIL/uL 4.38  4.66 R 3.73Low   4.45 R 4.55 R 4.54 R 4.52 R  Hemoglobin 13.0 - 17.0 g/dL 12.7Low   13.6 R 11.1Low   13.2 R 13.5 R 13.6 R 13.6 R  HCT 39.0 - 52.0 % 41.4  41.8 R 34.3Low   40.4 R 41.0 R 40.3 R 40.6 R  MCV 80.0 - 100.0 fL 94.5  89.7  92.0 R 91 R 90.0  88.9  89.8   MCH 26.0 - 34.0 pg 29.0  29.3  29.8  29.7 R 29.7  29.9  30.2   MCHC 30.0 - 36.0 g/dL 30.7  32.6 R 32.4  32.7 R 33.0 R 33.7 R 33.6 R  RDW 11.5 - 15.5 % 13.7  14.5 R 13.7  14.2 R 14.1 R 14.2 R 13.8 R  Platelets 150 - 400 K/uL 394  586High  R 616High   539High  R 537High  R 469High  R 515High  R  nRBC 0.0 - 0.2 % 0.0         Neutrophils Relative % % 70  71   70 R 67  70  70   Neutro Abs 1.7 - 7.7 K/uL 8.3High   8.6High  R  8.2High  R 8.0High  R 7.3High  R 7.5High  R  Lymphocytes Relative % 17  19    21  17  17    Lymphs Abs 0.7 - 4.0 K/uL 2.1  2.3 R  2.3 R 2.6 R 1.8 R 1.9 R  Monocytes Relative % 8  6    7  8  7    Monocytes Absolute 0.1 - 1.0 K/uL 0.9  0.8 R   0.8 R 0.8 R 0.7 R  Eosinophils Relative % 0  3    4  4  4    Eosinophils Absolute 0.0 - 0.5 K/uL 0.0  0.3 R   0.4 R 0.4 R 0.5 R  Basophils Relative % 1  1    1  1  2    Basophils Absolute 0.0 - 0.1 K/uL 0.1  0.1 R  0.1 R 0.2High  R 0.1 R 0.2High  R  Immature Granulocytes % 4    0 R     Abs Immature Granulocytes 0.00 - 0.07 K/uL 0.44High          Comment: Performed at [facility], [place], [place] [address]  Lymphs     20 R     Monocytes     7 R     Eos     2 R     Basos     1 R     Monocytes Absolute     0.8 R     EOS (ABSOLUTE)     0.2 R     Immature Grans (Abs)     0.0 R     Resulting Agency  [facility] [place] [facility] [employer] [facility]      Specimen Collected: 06/18/18 09:35  Last Resulted: 06/18/18 09:47     Lab Flowsheet    Order Details  View Encounter    Lab and Collection Details    Routing    Result History      R=Reference range differs from displayed range      Other Results from 06/18/2018   Contains abnormal data Comprehensive metabolic panel  Order: [id number]   Status:  Final result  Visible to patient:  Yes (MyChart)  Next appt:  10/29/2018 at 08:00 AM in Oncology ([facility])  Dx:  Essential thrombocytosis ([place])   Ref Range & Units 22mo ago (06/18/18) 72yr ago (06/14/17) 55yr ago (08/16/16) 27yr ago (07/29/16) 31yr ago (05/30/16) 27yr ago (10/23/15) 49yr ago (10/23/15)  Sodium 135 - 145 mmol/L 140  139  137  143 R 137     Potassium 3.5 - 5.1 mmol/L 4.3  5.0  4.3  5.4High  R 4.2     Chloride 98 - 111 mmol/L 104  103 R 106 R 102 R 101 R    CO2 22 - 32 mmol/L 30  27  26  27  R 28     Glucose, Bld 70 - 99 mg/dL 99  91 R 140High  R 93 R 95 R    BUN 6 - 20 mg/dL 24High   26High   16  20 R 23High      Creatinine, Ser 0.61 - 1.24 mg/dL 1.10  1.09  0.85  0.98 R 1.02   0.95   Calcium 8.9 - 10.3 mg/dL 9.2  9.3  8.2Low   9.7 R 9.1     Total Protein 6.5 - 8.1 g/dL 6.7  6.9   6.7 R 7.0  6.9    Albumin 3.5 - 5.0 g/dL 4.1  4.1   4.5 R 4.4  4.1    AST 15 - 41 U/L 27  24   22  R 27  25    ALT 0 - 44 U/L 17  14Low  R  16 R 19 R 19 R   Alkaline Phosphatase 38 - 126 U/L 38  49   37Low  R 35Low   42    Total Bilirubin 0.3 - 1.2 mg/dL 0.9  0.7   0.6 R 0.8  0.9    GFR calc non Af Amer >60 mL/min >60  >60  >60  86 R >60   >60   GFR calc Af Amer >60 mL/min >60  >60 CM >60 CM 99 R >60 CM  >60 CM  Anion gap 5 - 15 6  9  5   8      Comment: Performed at [facility], [address] [place], [place] [address]  Resulting Agency  [facility] [place] [facility] [place] [facility] [place] [facility]      Specimen Collected: 06/18/18 09:35  Last Resulted: 06/18/18 10:01

## 2018-10-24 ENCOUNTER — Encounter: Payer: Self-pay | Admitting: Internal Medicine

## 2018-10-29 ENCOUNTER — Inpatient Hospital Stay: Payer: BLUE CROSS/BLUE SHIELD

## 2018-12-07 ENCOUNTER — Encounter: Payer: Self-pay | Admitting: Internal Medicine

## 2018-12-12 ENCOUNTER — Other Ambulatory Visit: Payer: BLUE CROSS/BLUE SHIELD

## 2019-01-29 ENCOUNTER — Other Ambulatory Visit: Payer: Self-pay | Admitting: Internal Medicine

## 2019-01-30 ENCOUNTER — Other Ambulatory Visit: Payer: Self-pay | Admitting: Internal Medicine

## 2019-01-30 NOTE — Telephone Encounter (Signed)
CBC with Differential Order: [id number] Status:  Final result Visible to patient:  Yes (MyChart) Next appt:  03/14/2019 at 08:00 AM in Oncology ([facility]) Dx:  Essential thrombocytosis ([place])  Ref Range & Units 17mo ago 66yr ago 9yr ago  WBC 4.0 - 10.5 K/uL 11.8High   12.1High  R  24.6High    RBC 4.22 - 5.81 MIL/uL 4.38  4.66 R  3.73Low    Hemoglobin 13.0 - 17.0 g/dL 12.7Low   13.6 R  11.1Low    HCT 39.0 - 52.0 % 41.4  41.8 R  34.3Low    MCV 80.0 - 100.0 fL 94.5  89.7  92.0 R   MCH 26.0 - 34.0 pg 29.0  29.3  29.8   MCHC 30.0 - 36.0 g/dL 30.7  32.6 R  32.4   RDW 11.5 - 15.5 % 13.7  14.5 R  13.7   Platelets 150 - 400 K/uL 394  586High  R  616High    nRBC 0.0 - 0.2 % 0.0     Neutrophils Relative % % 70  71    Neutro Abs 1.7 - 7.7 K/uL 8.3High   8.6High  R    Lymphocytes Relative % 17  19    Lymphs Abs 0.7 - 4.0 K/uL 2.1  2.3 R    Monocytes Relative % 8  6    Monocytes Absolute 0.1 - 1.0 K/uL 0.9  0.8 R    Eosinophils Relative % 0  3    Eosinophils Absolute 0.0 - 0.5 K/uL 0.0  0.3 R    Basophils Relative % 1  1    Basophils Absolute 0.0 - 0.1 K/uL 0.1  0.1 R    Immature Granulocytes % 4     Abs Immature Granulocytes 0.00 - 0.07 K/uL 0.44High      Comment: Performed at [facility], [address] [place], [place] [address]  Resulting Agency  [facility]      Specimen Collected: 06/18/18 09:35 Last Resulted: 06/18/18 09:47     Lab Flowsheet   Order Details   View Encounter   Lab and Collection Details   Routing   Result History     R=Reference range differs from displayed range      Other Results from 06/18/2018  Contains abnormal data Comprehensive metabolic panel Order: [id number]  Status:  Final result Visible to patient:  Yes (MyChart) Next appt:  03/14/2019 at 08:00 AM in Oncology ([facility]) Dx:  Essential thrombocytosis ([place])  Ref Range & Units 83mo ago 71yr ago 81yr ago  Sodium 135 - 145 mmol/L 140  139  137   Potassium 3.5  - 5.1 mmol/L 4.3  5.0  4.3   Chloride 98 - 111 mmol/L 104  103 R  106 R   CO2 22 - 32 mmol/L 30  27  26    Glucose, Bld 70 - 99 mg/dL 99  91 R  140High  R   BUN 6 - 20 mg/dL 24High   26High   16   Creatinine, Ser 0.61 - 1.24 mg/dL 1.10  1.09  0.85   Calcium 8.9 - 10.3 mg/dL 9.2  9.3  8.2Low    Total Protein 6.5 - 8.1 g/dL 6.7  6.9    Albumin 3.5 - 5.0 g/dL 4.1  4.1    AST 15 - 41 U/L 27  24    ALT 0 - 44 U/L 17  14Low  R    Alkaline  Phosphatase 38 - 126 U/L 38  49    Total Bilirubin 0.3 - 1.2 mg/dL 0.9  0.7    GFR calc non Af Amer >60 mL/min >60  >60  >60   GFR calc Af Amer >60 mL/min >60  >60 CM  >60 CM   Anion gap 5 - 15 6  9  5    Comment: Performed at [facility], [address] [place], [place] [address]  Resulting Agency  [facility]      Specimen Collected: 06/18/18 09:35 Last Resulted: 06/18/18 10:01

## 2019-03-13 ENCOUNTER — Other Ambulatory Visit: Payer: Self-pay

## 2019-03-14 ENCOUNTER — Inpatient Hospital Stay: Payer: BC Managed Care – PPO | Attending: Internal Medicine

## 2019-03-14 ENCOUNTER — Other Ambulatory Visit: Payer: Self-pay

## 2019-03-14 ENCOUNTER — Encounter: Payer: Self-pay | Admitting: Internal Medicine

## 2019-03-14 DIAGNOSIS — D473 Essential (hemorrhagic) thrombocythemia: Secondary | ICD-10-CM

## 2019-03-14 LAB — CBC WITH DIFFERENTIAL/PLATELET
Abs Immature Granulocytes: 0.47 10*3/uL — ABNORMAL HIGH (ref 0.00–0.07)
Basophils Absolute: 0.1 10*3/uL (ref 0.0–0.1)
Basophils Relative: 1 %
Eosinophils Absolute: 0 10*3/uL (ref 0.0–0.5)
Eosinophils Relative: 0 %
HCT: 39.9 % (ref 39.0–52.0)
Hemoglobin: 12.7 g/dL — ABNORMAL LOW (ref 13.0–17.0)
Immature Granulocytes: 4 %
Lymphocytes Relative: 18 %
Lymphs Abs: 2.2 10*3/uL (ref 0.7–4.0)
MCH: 30 pg (ref 26.0–34.0)
MCHC: 31.8 g/dL (ref 30.0–36.0)
MCV: 94.3 fL (ref 80.0–100.0)
Monocytes Absolute: 1 10*3/uL (ref 0.1–1.0)
Monocytes Relative: 8 %
Neutro Abs: 8.3 10*3/uL — ABNORMAL HIGH (ref 1.7–7.7)
Neutrophils Relative %: 69 %
Platelets: 432 10*3/uL — ABNORMAL HIGH (ref 150–400)
RBC: 4.23 MIL/uL (ref 4.22–5.81)
RDW: 14.1 % (ref 11.5–15.5)
WBC: 12 10*3/uL — ABNORMAL HIGH (ref 4.0–10.5)
nRBC: 0 % (ref 0.0–0.2)

## 2019-03-19 DIAGNOSIS — C44519 Basal cell carcinoma of skin of other part of trunk: Secondary | ICD-10-CM | POA: Diagnosis not present

## 2019-03-19 DIAGNOSIS — L820 Inflamed seborrheic keratosis: Secondary | ICD-10-CM | POA: Diagnosis not present

## 2019-03-19 DIAGNOSIS — D2230 Melanocytic nevi of unspecified part of face: Secondary | ICD-10-CM | POA: Diagnosis not present

## 2019-03-19 DIAGNOSIS — Z1283 Encounter for screening for malignant neoplasm of skin: Secondary | ICD-10-CM | POA: Diagnosis not present

## 2019-03-19 DIAGNOSIS — D225 Melanocytic nevi of trunk: Secondary | ICD-10-CM | POA: Diagnosis not present

## 2019-03-19 DIAGNOSIS — D229 Melanocytic nevi, unspecified: Secondary | ICD-10-CM | POA: Diagnosis not present

## 2019-03-21 DIAGNOSIS — C44519 Basal cell carcinoma of skin of other part of trunk: Secondary | ICD-10-CM | POA: Diagnosis not present

## 2019-04-26 ENCOUNTER — Other Ambulatory Visit: Payer: Self-pay | Admitting: Internal Medicine

## 2019-04-26 NOTE — Telephone Encounter (Signed)
CBC with Differential/Platelet Order: [id number] Status:  Final result Visible to patient:  Yes (MyChart) Next appt:  06/19/2019 at 09:30 AM in Oncology ([facility]) Dx:  Essential thrombocytosis ([place])  Ref Range & Units 61mo ago 74mo ago 68yr ago  WBC 4.0 - 10.5 K/uL 12.0High   11.8High   12.1High  R   RBC 4.22 - 5.81 MIL/uL 4.23  4.38  4.66 R   Hemoglobin 13.0 - 17.0 g/dL 12.7Low   12.7Low   13.6 R   HCT 39.0 - 52.0 % 39.9  41.4  41.8 R   MCV 80.0 - 100.0 fL 94.3  94.5  89.7   MCH 26.0 - 34.0 pg 30.0  29.0  29.3   MCHC 30.0 - 36.0 g/dL 31.8  30.7  32.6 R   RDW 11.5 - 15.5 % 14.1  13.7  14.5 R   Platelets 150 - 400 K/uL 432High   394  586High  R   nRBC 0.0 - 0.2 % 0.0  0.0    Neutrophils Relative % % 69  70  71   Neutro Abs 1.7 - 7.7 K/uL 8.3High   8.3High   8.6High  R   Lymphocytes Relative % 18  17  19    Lymphs Abs 0.7 - 4.0 K/uL 2.2  2.1  2.3 R   Monocytes Relative % 8  8  6    Monocytes Absolute 0.1 - 1.0 K/uL 1.0  0.9  0.8 R   Eosinophils Relative % 0  0  3   Eosinophils Absolute 0.0 - 0.5 K/uL 0.0  0.0  0.3 R   Basophils Relative % 1  1  1    Basophils Absolute 0.0 - 0.1 K/uL 0.1  0.1  0.1 R   Immature Granulocytes % 4  4    Abs Immature Granulocytes 0.00 - 0.07 K/uL 0.47High   0.44High  CM    Comment: Performed at [facility], [address] [place], [place] [address]  Resulting Agency  [facility]      Specimen Collected: 03/14/19 08:26 Last Resulted: 03/14/19 08:39

## 2019-06-18 ENCOUNTER — Encounter: Payer: Self-pay | Admitting: Internal Medicine

## 2019-06-18 ENCOUNTER — Other Ambulatory Visit: Payer: Self-pay

## 2019-06-18 NOTE — Progress Notes (Signed)
Patient pre screened for office appointment, no questions or concerns today. Patient reminded of upcoming appointment time and date. 

## 2019-06-19 ENCOUNTER — Inpatient Hospital Stay: Payer: BC Managed Care – PPO | Attending: Internal Medicine

## 2019-06-19 ENCOUNTER — Other Ambulatory Visit: Payer: Self-pay

## 2019-06-19 ENCOUNTER — Inpatient Hospital Stay (HOSPITAL_BASED_OUTPATIENT_CLINIC_OR_DEPARTMENT_OTHER): Payer: BC Managed Care – PPO | Admitting: Internal Medicine

## 2019-06-19 DIAGNOSIS — R7402 Elevation of levels of lactic acid dehydrogenase (LDH): Secondary | ICD-10-CM | POA: Insufficient documentation

## 2019-06-19 DIAGNOSIS — D473 Essential (hemorrhagic) thrombocythemia: Secondary | ICD-10-CM

## 2019-06-19 DIAGNOSIS — D72829 Elevated white blood cell count, unspecified: Secondary | ICD-10-CM | POA: Diagnosis not present

## 2019-06-19 LAB — LACTATE DEHYDROGENASE: LDH: 302 U/L — ABNORMAL HIGH (ref 98–192)

## 2019-06-19 LAB — COMPREHENSIVE METABOLIC PANEL
ALT: 21 U/L (ref 0–44)
AST: 28 U/L (ref 15–41)
Albumin: 4.2 g/dL (ref 3.5–5.0)
Alkaline Phosphatase: 38 U/L (ref 38–126)
Anion gap: 3 — ABNORMAL LOW (ref 5–15)
BUN: 24 mg/dL — ABNORMAL HIGH (ref 6–20)
CO2: 28 mmol/L (ref 22–32)
Calcium: 8.6 mg/dL — ABNORMAL LOW (ref 8.9–10.3)
Chloride: 105 mmol/L (ref 98–111)
Creatinine, Ser: 1.16 mg/dL (ref 0.61–1.24)
GFR calc Af Amer: >60 mL/min (ref >60)
GFR calc non Af Amer: >60 mL/min (ref >60)
Glucose, Bld: 82 mg/dL (ref 70–99)
Potassium: 4.6 mmol/L (ref 3.5–5.1)
Sodium: 136 mmol/L (ref 135–145)
Total Bilirubin: 0.8 mg/dL (ref 0.3–1.2)
Total Protein: 6.7 g/dL (ref 6.5–8.1)

## 2019-06-19 LAB — CBC WITH DIFFERENTIAL/PLATELET
Abs Immature Granulocytes: 0.44 10*3/uL — ABNORMAL HIGH (ref 0.00–0.07)
Basophils Absolute: 0.1 10*3/uL (ref 0.0–0.1)
Basophils Relative: 1 %
Eosinophils Absolute: 0 10*3/uL (ref 0.0–0.5)
Eosinophils Relative: 0 %
HCT: 42.1 % (ref 39.0–52.0)
Hemoglobin: 12.7 g/dL — ABNORMAL LOW (ref 13.0–17.0)
Immature Granulocytes: 4 %
Lymphocytes Relative: 19 %
Lymphs Abs: 2.3 10*3/uL (ref 0.7–4.0)
MCH: 29.5 pg (ref 26.0–34.0)
MCHC: 30.2 g/dL (ref 30.0–36.0)
MCV: 97.9 fL (ref 80.0–100.0)
Monocytes Absolute: 1 10*3/uL (ref 0.1–1.0)
Monocytes Relative: 9 %
Neutro Abs: 7.9 10*3/uL — ABNORMAL HIGH (ref 1.7–7.7)
Neutrophils Relative %: 67 %
Platelets: 387 10*3/uL (ref 150–400)
RBC: 4.3 MIL/uL (ref 4.22–5.81)
RDW: 14.5 % (ref 11.5–15.5)
WBC: 11.8 10*3/uL — ABNORMAL HIGH (ref 4.0–10.5)
nRBC: 0 % (ref 0.0–0.2)

## 2019-06-19 NOTE — Assessment & Plan Note (Addendum)
#   Essential thrombocytosis-LOW risk; [positive for CALR mutation] no complications noted.  Asymptomatic.  Platelets today 387; stable on anagrelide 1.5 mg once day. STABLE.   # Discussed re: CALR/ good prognosis.  Continue anagrelide 1.5 mg total once a day.  # Mild Leucocytosis-monitor for now; immature granulocytes noted.  Also LDH elevated at 302.  Unclear significance.   # DISPOSITION:  # Follow-up in 12 months CBC CMP/LDH- Dr.B  Addendum we will recheck the patient regarding repeating labs in 3 months- [CBC CMP LDH; peripheral smear flow cytometry]  Cc:   Dr. Rosanna Randy.

## 2019-06-19 NOTE — Progress Notes (Signed)
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  Patient appetite is good.  No weight loss.  No night sweats.  No nausea or vomiting.  No fatigue.   Review of Systems  Constitutional: Negative for chills, diaphoresis, fever, malaise/fatigue and weight loss.  HENT: Negative for nosebleeds and sore throat.   Eyes: Negative for double vision.  Respiratory: Negative for cough, hemoptysis, sputum production, shortness of breath and wheezing.   Cardiovascular: Negative for chest pain, palpitations, orthopnea and leg swelling.  Gastrointestinal: Negative for abdominal pain, blood in stool, constipation, diarrhea, heartburn, melena, nausea and vomiting.  Genitourinary: Negative for dysuria, frequency and urgency.  Musculoskeletal: Negative for back pain and joint pain.  Skin: Negative.  Negative for itching and rash.  Neurological: Negative for dizziness, tingling, focal weakness, weakness and headaches.  Endo/Heme/Allergies: Does not bruise/bleed easily.  Psychiatric/Behavioral: Negative for depression. The patient is not nervous/anxious and does not have insomnia.      PAST MEDICAL HISTORY :  Past Medical History:   Diagnosis Date  . Arthritis    osteoartiritis  . High platelet count     PAST SURGICAL HISTORY :   Past Surgical History:  Procedure Laterality Date  . COLONOSCOPY  2016  . CYSTECTOMY  years ago   pilonidal cyst removed  . KNEE SURGERY Right 20 years ago   arthrocscopy  . TOTAL KNEE ARTHROPLASTY Right 08/15/2016   Procedure: RIGHT TOTAL KNEE ARTHROPLASTY;  Surgeon: Gaynelle Arabian, MD;  Location: [facility];  Service: Orthopedics;  Laterality: Right;    FAMILY HISTORY :  History reviewed. No pertinent family history.  SOCIAL HISTORY:   Social History   Tobacco Use  . Smoking status: Never Smoker  . Smokeless tobacco: Never Used  Substance Use Topics  . Alcohol use: Yes    Alcohol/week: 3.0 - 7.0 standard drinks    Types: 3 - 7 Glasses of wine per week  . Drug use: No    ALLERGIES:  is allergic to penicillins.  MEDICATIONS:  Current Outpatient Medications  Medication Sig Dispense Refill  . anagrelide (AGRYLIN) 0.5 MG capsule TAKE 2 CAPSULES EVERY MORNING AND TAKE 1CAPSULE EVERY EVENING 270 capsule 1  . aspirin EC 81 MG tablet Take 1 tablet by mouth daily.    . celecoxib (CELEBREX) 200 MG capsule celecoxib 200 mg capsule     No current facility-administered medications for this visit.     PHYSICAL EXAMINATION: ECOG PERFORMANCE STATUS: 0 - Asymptomatic  BP 123/66 (BP Location: Right Arm, Patient Position: Sitting, Cuff Size: Normal)   Pulse (!) 57   Temp (!) 96.1 F (35.6 C) (Tympanic)   Wt 170 lb 2 oz (77.2 kg)   BMI 24.41 kg/m   Filed Weights   06/19/19 0947  Weight: 170 lb 2 oz (77.2 kg)  Physical Exam  Constitutional: He is oriented to person, place, and time and well-developed, well-nourished, and in no distress.  HENT:  Head: Normocephalic and atraumatic.  Mouth/Throat: Oropharynx is clear and moist. No oropharyngeal exudate.  Eyes: Pupils are equal, round, and reactive to light.  Neck: Normal range of motion. Neck supple.  Cardiovascular: Normal  rate and regular rhythm.  Pulmonary/Chest: Breath sounds normal. No respiratory distress. He has no wheezes.  Abdominal: Soft. Bowel sounds are normal. He exhibits no distension and no mass. There is no abdominal tenderness. There is no rebound and no guarding.  Musculoskeletal: Normal range of motion.        General: No tenderness or edema.  Neurological: He is alert and oriented to person, place, and time.  Skin: Skin is warm.  Psychiatric: Affect normal.    LABORATORY DATA:  I have reviewed the data as listed    Component Value Date/Time   NA 136 06/19/2019 0930   NA 143 07/29/2016 0844   K 4.6 06/19/2019 0930   CL 105 06/19/2019 0930   CO2 28 06/19/2019 0930   GLUCOSE 82 06/19/2019 0930   BUN 24 (H) 06/19/2019 0930   BUN 20 07/29/2016 0844   CREATININE 1.16 06/19/2019 0930   CREATININE 1.10 04/25/2014 0823   CALCIUM 8.6 (L) 06/19/2019 0930   PROT 6.7 06/19/2019 0930   PROT 6.7 07/29/2016 0844   PROT 6.7 04/25/2014 0823   ALBUMIN 4.2 06/19/2019 0930   ALBUMIN 4.5 07/29/2016 0844   ALBUMIN 3.8 04/25/2014 0823   AST 28 06/19/2019 0930   AST 22 04/25/2014 0823   ALT 21 06/19/2019 0930   ALT 26 04/25/2014 0823   ALKPHOS 38 06/19/2019 0930   ALKPHOS 44 (L) 04/25/2014 0823   BILITOT 0.8 06/19/2019 0930   BILITOT 0.6 07/29/2016 0844   BILITOT 0.6 04/25/2014 0823   GFRNONAA >60 06/19/2019 0930   GFRNONAA >60 04/25/2014 0823   GFRNONAA >60 02/04/2014 0830   GFRAA >60 06/19/2019 0930   GFRAA >60 04/25/2014 0823   GFRAA >60 02/04/2014 0830    No results found for: SPEP, UPEP  Lab Results  Component Value Date   WBC 11.8 (H) 06/19/2019   NEUTROABS 7.9 (H) 06/19/2019   HGB 12.7 (L) 06/19/2019   HCT 42.1 06/19/2019   MCV 97.9 06/19/2019   PLT 387 06/19/2019      Chemistry      Component Value Date/Time   NA 136 06/19/2019 0930   NA 143 07/29/2016 0844   K 4.6 06/19/2019 0930   CL 105 06/19/2019 0930   CO2 28 06/19/2019 0930   BUN 24 (H) 06/19/2019 0930   BUN  20 07/29/2016 0844   CREATININE 1.16 06/19/2019 0930   CREATININE 1.10 04/25/2014 0823   GLU 89 01/01/2014      Component Value Date/Time   CALCIUM 8.6 (L) 06/19/2019 0930   ALKPHOS 38 06/19/2019 0930   ALKPHOS 44 (L) 04/25/2014 0823   AST 28 06/19/2019 0930   AST 22 04/25/2014 0823   ALT 21 06/19/2019 0930   ALT 26 04/25/2014 0823   BILITOT 0.8 06/19/2019 0930   BILITOT 0.6 07/29/2016 0844   BILITOT 0.6 04/25/2014 0823         ASSESSMENT & PLAN:   Essential thrombocytosis ([place]) # Essential thrombocytosis-LOW risk; [positive for CALR mutation] no complications noted.  Asymptomatic.  Platelets today 387; stable on anagrelide 1.5 mg once day. STABLE.   # Discussed re: CALR/ good prognosis.  Continue anagrelide 1.5  mg total once a day.  # Mild Leucocytosis-monitor for now; immature granulocytes noted.  Also LDH elevated at 302.  Unclear significance.   # DISPOSITION:  # Follow-up in 12 months CBC CMP/LDH- Dr.B  Addendum we will recheck the patient regarding repeating labs in 3 months- [CBC CMP LDH; peripheral smear flow cytometry]  Cc:   Dr. Rosanna Randy.     Cammie Sickle, MD 06/19/2019 10:26 AM

## 2019-06-20 ENCOUNTER — Other Ambulatory Visit: Payer: Self-pay

## 2019-06-20 DIAGNOSIS — D473 Essential (hemorrhagic) thrombocythemia: Secondary | ICD-10-CM

## 2019-06-20 LAB — TECHNOLOGIST SMEAR REVIEW
Plt Morphology: ADEQUATE
RBC Morphology: NORMAL

## 2019-06-21 ENCOUNTER — Telehealth: Payer: Self-pay | Admitting: Internal Medicine

## 2019-06-21 NOTE — Telephone Encounter (Signed)
Late entry spoke to patient on December 2-given slightly elevated white count/LDH-recommend follow-up labs in 3 months.We will call with results.   C-please schedule lab appointment in 3 months; [I have ordered a 65-month labs.]-

## 2019-07-16 DIAGNOSIS — R109 Unspecified abdominal pain: Secondary | ICD-10-CM | POA: Diagnosis not present

## 2019-07-16 DIAGNOSIS — Z20828 Contact with and (suspected) exposure to other viral communicable diseases: Secondary | ICD-10-CM | POA: Diagnosis not present

## 2019-09-16 ENCOUNTER — Other Ambulatory Visit: Payer: Self-pay

## 2019-09-17 ENCOUNTER — Other Ambulatory Visit: Payer: Self-pay

## 2019-09-17 ENCOUNTER — Inpatient Hospital Stay: Payer: BC Managed Care – PPO | Attending: Internal Medicine

## 2019-09-17 DIAGNOSIS — D72829 Elevated white blood cell count, unspecified: Secondary | ICD-10-CM | POA: Insufficient documentation

## 2019-09-17 DIAGNOSIS — D473 Essential (hemorrhagic) thrombocythemia: Secondary | ICD-10-CM | POA: Diagnosis not present

## 2019-09-17 DIAGNOSIS — R7402 Elevation of levels of lactic acid dehydrogenase (LDH): Secondary | ICD-10-CM | POA: Insufficient documentation

## 2019-09-17 DIAGNOSIS — Z79899 Other long term (current) drug therapy: Secondary | ICD-10-CM | POA: Diagnosis not present

## 2019-09-17 LAB — CBC WITH DIFFERENTIAL/PLATELET
Abs Immature Granulocytes: 0.37 10*3/uL — ABNORMAL HIGH (ref 0.00–0.07)
Basophils Absolute: 0.1 10*3/uL (ref 0.0–0.1)
Basophils Relative: 1 %
Eosinophils Absolute: 0 10*3/uL (ref 0.0–0.5)
Eosinophils Relative: 0 %
HCT: 41.6 % (ref 39.0–52.0)
Hemoglobin: 12.7 g/dL — ABNORMAL LOW (ref 13.0–17.0)
Immature Granulocytes: 3 %
Lymphocytes Relative: 17 %
Lymphs Abs: 2.3 10*3/uL (ref 0.7–4.0)
MCH: 29.7 pg (ref 26.0–34.0)
MCHC: 30.5 g/dL (ref 30.0–36.0)
MCV: 97.4 fL (ref 80.0–100.0)
Monocytes Absolute: 1.1 10*3/uL — ABNORMAL HIGH (ref 0.1–1.0)
Monocytes Relative: 8 %
Neutro Abs: 9.1 10*3/uL — ABNORMAL HIGH (ref 1.7–7.7)
Neutrophils Relative %: 71 %
Platelets: 407 10*3/uL — ABNORMAL HIGH (ref 150–400)
RBC: 4.27 MIL/uL (ref 4.22–5.81)
RDW: 14.2 % (ref 11.5–15.5)
WBC: 13 10*3/uL — ABNORMAL HIGH (ref 4.0–10.5)
nRBC: 0 % (ref 0.0–0.2)

## 2019-09-17 LAB — BASIC METABOLIC PANEL
Anion gap: 9 (ref 5–15)
BUN: 27 mg/dL — ABNORMAL HIGH (ref 6–20)
CO2: 26 mmol/L (ref 22–32)
Calcium: 9.2 mg/dL (ref 8.9–10.3)
Chloride: 104 mmol/L (ref 98–111)
Creatinine, Ser: 1.07 mg/dL (ref 0.61–1.24)
GFR calc Af Amer: >60 mL/min (ref >60)
GFR calc non Af Amer: >60 mL/min (ref >60)
Glucose, Bld: 84 mg/dL (ref 70–99)
Potassium: 4.8 mmol/L (ref 3.5–5.1)
Sodium: 139 mmol/L (ref 135–145)

## 2019-09-17 LAB — LACTATE DEHYDROGENASE: LDH: 295 U/L — ABNORMAL HIGH (ref 98–192)

## 2019-09-20 LAB — COMP PANEL: LEUKEMIA/LYMPHOMA: Immunophenotypic Profile: 1

## 2019-09-24 ENCOUNTER — Telehealth: Payer: Self-pay | Admitting: Internal Medicine

## 2019-09-24 DIAGNOSIS — D473 Essential (hemorrhagic) thrombocythemia: Secondary | ICD-10-CM

## 2019-09-24 NOTE — Telephone Encounter (Signed)
On 3/8-spoke to patient regarding results of the blood work/slightly rising white count.  Recommend closer follow-up 3 months  C- Schedule-in 3 months-MD; CBC; CMP LDH.  Cancel December 2021 appointment

## 2019-09-24 NOTE — Addendum Note (Signed)
Addended by: Gloris Ham on: 09/24/2019 11:07 AM   Modules accepted: Orders

## 2019-09-28 ENCOUNTER — Other Ambulatory Visit: Payer: Self-pay | Admitting: Internal Medicine

## 2019-09-30 NOTE — Telephone Encounter (Signed)
CBC with Differential/Platelet Order: [id number]  Status: Final result  Visible to patient: Yes (MyChart)  Next appt: 12/25/2019 at 01:15 PM in Oncology ([facility])  Dx: Essential thrombocytosis ([place])    Ref Range & Units 13 d ago  WBC 4.0 - 10.5 K/uL 13.0  RBC 4.22 - 5.81 MIL/uL 4.27   Hemoglobin 13.0 - 17.0 g/dL 12.7  HCT 39.0 - 52.0 % 41.6   MCV 80.0 - 100.0 fL 97.4   MCH 26.0 - 34.0 pg 29.7   MCHC 30.0 - 36.0 g/dL 30.5   RDW 11.5 - 15.5 % 14.2   Platelets 150 - 400 K/uL 407  nRBC 0.0 - 0.2 % 0.0   Neutrophils Relative % % 71   Neutro Abs 1.7 - 7.7 K/uL 9.1  Lymphocytes Relative % 17   Lymphs Abs 0.7 - 4.0 K/uL 2.3   Monocytes Relative % 8   Monocytes Absolute 0.1 - 1.0 K/uL 1.1  Eosinophils Relative % 0   Eosinophils Absolute 0.0 - 0.5 K/uL 0.0   Basophils Relative % 1   Basophils Absolute 0.0 - 0.1 K/uL 0.1   Immature Granulocytes % 3   Abs Immature Granulocytes 0.00 - 0.07 K/uL 0.37  Comment: Performed at [facility], [address] [place],  [address]  Resulting Agency  [facility]     Specimen Collected: 09/17/19 15:00   Last Resulted: 09/17/19 15:15   Lab Flowsheet  Order Details  View Encounter  Lab and Collection Details  Routing  Result History      Other Results from 09/17/2019 Flow cytometry panel-leukemia/lymphoma work-up  Status: Edited Result - FINAL  Visible to patient: Yes ([place])  Next appt: 12/25/2019 at 01:15 PM in Oncology ([facility])  Dx: Essential thrombocytosis ([place])  Order: [id number]  Component 13 d ago  PATH INTERP XXX-IMP Comment   Comment: (NOTE)  Small population of myeloblasts (1% of leukocytes) without phenotypic  aberrancy, see comment.    ANNOTATION COMMENT IMP Comment VC   Comment: (NOTE)  The finding is non-specific, and can be seen in both reactive and  neoplastic conditions. Clinical correlation is recommended.    CLINICAL INFO Comment VC   Comment: (NOTE)  Accompanying CBC dated 09-17-19 shows: WBC  count 13.0, Hgb 12.7, Neu  9.1, Lym  2.3, Mon 1.1, Plt 407K    Specimen Type Comment   Comment: Peripheral blood  ASSESSMENT OF LEUKOCYTES Comment   Comment: (NOTE)  No monoclonal B cell population is detected. kappa:lambda ratio 1.3  There is no loss of, or aberrant expression of, the pan T cell  antigens to  suggest a neoplastic T cell process.  A decreased CD4/T helper to CD8/T suppressor cell ratio is detected.  CD4:CD8 ratio 0.8  A myeloblast phenotype is detected with expression of CD45 (dim),  CD34,  CD117, HLADR, CD33, CD13, CD38 and C11c (dim), representing 1% of  leukocytes, without phenotypic aberrancy.  Granulocytes show left-shifted maturation.  Mature monocytes show aberrant expression of CD56, a finding that can  be  seen in association with both reactive/activated processes as well as  neoplastic processes.  Analysis of the leukocyte population shows: granulocytes 78%  (including  basophils 3%) , monocytes 4%, lymphocytes 17%, blasts 1%, B cells 2%,  T  cells 14%, NK cells 1%.    % Viable Cells Comment VC   Comment: 96%  Immunophenotypic Profile 1% of total cells (Phenotype below) VC   Comment: Comment  Abnormal cell population: present    ANALYSIS  AND GATING STRATEGY Comment   Comment: 8 color analysis with CD45/SSC  IMMUNOPHENOTYPING STUDY Comment   Comment: (NOTE)  CD2 (-) CD3 (-)  CD4 (-) CD5 (-)  CD7 (-) CD8 (-)  CD10 (-) CD11b (-)  CD11c (+) Dim CD13 (+)  CD14 (-) CD15 (-)  CD16 (-) CD19 (-)  CD20 (-) CD22 (-)  CD23 (-) CD33 (+) Dim  CD34 (+) CD38 (+)  CD45 (+) Dim CD56 (-)  CD57 (-) CD103 (-)  CD117 (+) FMC-7 (-)  HLA-DR (+) KAPPA (-)  LAMBDA (-) CD64 (-)    PATHOLOGIST NAME Comment   Comment: Smiley Houseman, M.D.  COMMENT: Comment VC   Comment: (NOTE)  Each antibody in this assay was utilized to assess for potential  abnormalities of studied cell populations or to characterize  identified abnormalities.  This test was developed and its  performance characteristics  determined by [facility]. It has not been cleared or approved by the  U.S. Food and Drug Administration.  The FDA has determined that such clearance or approval is not  necessary. This test is used for clinical purposes. It should not  be regarded as investigational or for research.  Performed At: -[facility]  [place], [place] [id number]  Katina Degree MDPhD [id number]  Performed At: [facility]  [address] [place], [place] [id number]  Katina Degree MDPhD [id number]    Resulting Agency [facility] LAB     Specimen Collected: 09/17/19 15:00   Last Resulted: 09/20/19 16:37   Lab Flowsheet  Order Details  View Encounter  Lab and Collection Details  Routing  Result History    VC=Value has a corrected status       Basic metabolic panel  Status: Final result  Visible to patient: Yes (MyChart)  Next appt: 12/25/2019 at 01:15 PM in Oncology ([facility])  Dx: Essential thrombocytosis ([place])  Order: [id number]    Ref Range & Units 13 d ago  Sodium 135 - 145 mmol/L 139   Potassium 3.5 - 5.1 mmol/L 4.8   Chloride 98 - 111 mmol/L 104   CO2 22 - 32 mmol/L 26   Glucose, Bld 70 - 99 mg/dL 84   Comment: Glucose reference range applies only to samples taken after fasting for at least 8 hours.  BUN 6 - 20 mg/dL 27  Creatinine, Ser 0.61 - 1.24 mg/dL 1.07   Calcium 8.9 - 10.3 mg/dL 9.2   GFR calc non Af Amer >60 mL/min >60   GFR calc Af Amer >60 mL/min >60   Anion gap 5 - 15 9   Comment: Performed at [facility], [address] [place], [place] [address]  Resulting Agency  [facility]     Specimen Collected: 09/17/19 15:00   Last Resulted: 09/17/19 15:25   Lab Flowsheet  Order Details  View Encounter  Lab and Collection Details  Routing  Result History         Lactate dehydrogenase  Status: Final result  Visible to patient: Yes (MyChart)  Next appt: 12/25/2019 at 01:15 PM in Oncology ([facility])  Dx: Essential  thrombocytosis ([place])  Order: [id number]    Ref Range & Units 13 d ago  LDH 98 - 192 U/L 295  Comment: Performed at [facility], [address] [place], [place] [address]  Resulting Agency  [facility] LAB     Specimen Collected: 09/17/19 15:00   Last Resulted: 09/17/19 15:25

## 2019-10-14 ENCOUNTER — Ambulatory Visit: Payer: BC Managed Care – PPO | Attending: Internal Medicine

## 2019-10-14 DIAGNOSIS — Z23 Encounter for immunization: Secondary | ICD-10-CM

## 2019-10-14 NOTE — Progress Notes (Signed)
   Covid-19 Vaccination Clinic  Name:  Anthony Graves    MRN: [id number] DOB: [birth year]  10/14/2019  Mr. [name] was observed post Covid-19 immunization for 15 minutes without incident. He was provided with Vaccine Information Sheet and instruction to access the V-Safe system.   Mr. [name] was instructed to call 911 with any severe reactions post vaccine: [name] Kitchen Difficulty breathing  . Swelling of face and throat  . A fast heartbeat  . A bad rash all over body  . Dizziness and weakness   Immunizations Administered    Name Date Dose VIS Date Route   Pfizer COVID-19 Vaccine 10/14/2019 12:55 PM 0.3 mL 06/28/2019 Intramuscular   Manufacturer: [employer]   Lot: [id number]   [place]: [id number]

## 2019-10-21 ENCOUNTER — Ambulatory Visit (INDEPENDENT_AMBULATORY_CARE_PROVIDER_SITE_OTHER): Payer: BC Managed Care – PPO | Admitting: Family Medicine

## 2019-10-21 ENCOUNTER — Encounter: Payer: Self-pay | Admitting: Family Medicine

## 2019-10-21 DIAGNOSIS — J029 Acute pharyngitis, unspecified: Secondary | ICD-10-CM

## 2019-10-21 DIAGNOSIS — R05 Cough: Secondary | ICD-10-CM

## 2019-10-21 DIAGNOSIS — R059 Cough, unspecified: Secondary | ICD-10-CM

## 2019-10-21 MED ORDER — AZITHROMYCIN 250 MG PO TABS: ORAL_TABLET | ORAL | 0 refills | Status: DC

## 2019-10-21 MED ORDER — PREDNISONE 5 MG PO TABS: ORAL_TABLET | ORAL | 0 refills | Status: DC

## 2019-10-21 NOTE — Progress Notes (Signed)
Virtual telephone visit      Virtual Visit via Telephone Note   This visit type was conducted due to national recommendations for restrictions regarding the COVID-19 Pandemic (e.g. social distancing) in an effort to limit this patient's exposure and mitigate transmission in our community. Due to his co-morbid illnesses, this patient is at least at moderate risk for complications without adequate follow up. This format is felt to be most appropriate for this patient at this time. The patient did not have access to video technology or had technical difficulties with video requiring transitioning to audio format only (telephone). Physical exam was limited to content and character of the telephone converstion.    Patient location: Home Provider location: Office    Patient: Anthony Graves   DOB: Jan 19, 1961   59 y.o. Male  MRN: [id number] Visit Date: 10/21/2019  Today's Provider: Vernie Murders, PA  Subjective:    Chief Complaint  Patient presents with  . URI   HPI Patient C/O of scratchy sore throat X 2 days, congestion, cough and postnasal drainage X 1 week after receiving COVID vaccine. Patient has tried Advil for symptoms with no relief. Denies fever or significant headache.    Patient Active Problem List   Diagnosis Date Noted  . OA (osteoarthritis) of knee 08/15/2016  . 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 Personal history of diseases of skin or subcutaneous tissue 07/30/2015  . Essential thrombocytosis ([place]) 07/30/2015   Past Medical History:  Diagnosis Date  . Arthritis    osteoartiritis  . High platelet count    Past Surgical History:  Procedure Laterality Date  . COLONOSCOPY  2016  . CYSTECTOMY  years ago   pilonidal cyst removed  . KNEE SURGERY Right 20 years ago   arthrocscopy  . TOTAL KNEE ARTHROPLASTY Right 08/15/2016   Procedure: RIGHT TOTAL KNEE ARTHROPLASTY;  Surgeon: Gaynelle Arabian, MD;  Location: [facility];  Service: Orthopedics;  Laterality: Right;    Social History   Tobacco Use  . Smoking status: Never Smoker  . Smokeless tobacco: Never Used  Substance Use Topics  . Alcohol use: Yes    Alcohol/week: 3.0 - 7.0 standard drinks    Types: 3 - 7 Glasses of wine per week  . Drug use: No   History reviewed. No pertinent family history. Allergies  Allergen Reactions  . Penicillins     Reaction as a child Has patient had a PCN reaction causing immediate rash, facial/tongue/throat swelling, SOB or lightheadedness with hypotension:unsure Has patient had a PCN reaction causing severe rash involving mucus membranes or skin necrosis:unsure Has patient had a PCN reaction that required hospitalization:No Has patient had a PCN reaction occurring within the last 10 years: No If all of the above answers are "NO", then may proceed with Cephalosporin use.       Medications: Outpatient Medications Prior to Visit  Medication Sig  . anagrelide (AGRYLIN) 0.5 MG capsule TAKE 2 CAPSULES EVERY MORNING AND TAKE 1CAPSULE EVERY EVENING  . aspirin EC 81 MG tablet Take 1 tablet by mouth daily.  . celecoxib (CELEBREX) 200 MG capsule celecoxib 200 mg capsule   No facility-administered medications prior to visit.    Review of Systems  Constitutional: Negative.   HENT: Positive for congestion, postnasal drip and sore throat.   Respiratory: Positive for cough.   Cardiovascular: Negative.   Musculoskeletal: Negative.         Objective:    There were no vitals taken for this visit.  Physical: No apparent respiratory distress during telephonic interview.     Assessment & Plan:    1. Pharyngitis, unspecified etiology Onset of a scratchy throat that progressed to PND with cough over the past week. Some nasal congestion. May use antihistamine if any allergic rhinitis sneezing. Treat with Z-pak for possible strep or sinusitis infection. May use warm saltwater gargles prn discomfort. Recheck if fever develops. May need COVID test if symptoms  persist. Could be a side effect of his first COVID vaccination he got the beginning of last week. - azithromycin (ZITHROMAX) 250 MG tablet; Take 2 tablets by mouth today then 1 daily for 4 days.  Dispense: 6 tablet; Refill: 0  2. Cough Onset over the past week without fever or significant shortness of breath. Had first COVID vaccination over a week ago. Suspect early bronchitis versus persistent reaction to vaccination. Will treat with Z-pak and prednisone taper. Increase fluid intake, use Tylenol prn discomfort and Robitussin-DM prn cough. Should consider COVID test if loss of sense of taste or smell. Follow CDC pandemic restrictions. Recheck prn. - azithromycin (ZITHROMAX) 250 MG tablet; Take 2 tablets by mouth today then 1 daily for 4 days.  Dispense: 6 tablet; Refill: 0 - predniSONE (DELTASONE) 5 MG tablet; Taper down by 1 tablet daily starting at 6 day 1, 5 day 2, 4 day 3, 3 day 4, 2 day 5 and 1 day 6 divided among meals each day by mouth.  Dispense: 21 tablet; Refill: 0     I discussed the assessment and treatment plan with the patient. The patient was provided an opportunity to ask questions and all were answered. The patient agreed with the plan and demonstrated an understanding of the instructions.   The patient was advised to call back or seek an in-person evaluation if the symptoms worsen or if the condition fails to improve as anticipated.  I provided 15 minutes of non-face-to-face time during this encounter.   Vernie Murders, [place] [phone] (phone) [phone] (fax)  [place]

## 2019-10-22 DIAGNOSIS — Z1152 Encounter for screening for COVID-19: Secondary | ICD-10-CM | POA: Diagnosis not present

## 2019-11-04 ENCOUNTER — Ambulatory Visit: Payer: BC Managed Care – PPO

## 2019-11-11 ENCOUNTER — Ambulatory Visit: Payer: BC Managed Care – PPO | Attending: Internal Medicine

## 2019-11-11 DIAGNOSIS — Z23 Encounter for immunization: Secondary | ICD-10-CM

## 2019-11-11 NOTE — Progress Notes (Signed)
   Covid-19 Vaccination Clinic  Name:  Anthony Graves    MRN: [id number] DOB: 02/22/1961  11/11/2019  Mr. Schechter was observed post Covid-19 immunization for 15 minutes without incident. He was provided with Vaccine Information Sheet and instruction to access the V-Safe system.   Mr. Fallert was instructed to call 911 with any severe reactions post vaccine: [name] Kitchen Difficulty breathing  . Swelling of face and throat  . A fast heartbeat  . A bad rash all over body  . Dizziness and weakness   Immunizations Administered    Name Date Dose VIS Date Route   Pfizer COVID-19 Vaccine 11/11/2019  9:22 AM 0.3 mL 09/11/2018 Intramuscular   Manufacturer: [employer]   Lot: [id number]   [place]: [id number]

## 2019-12-25 ENCOUNTER — Encounter: Payer: Self-pay | Admitting: Internal Medicine

## 2019-12-25 ENCOUNTER — Inpatient Hospital Stay: Payer: BC Managed Care – PPO | Attending: Internal Medicine

## 2019-12-25 ENCOUNTER — Other Ambulatory Visit: Payer: Self-pay

## 2019-12-25 ENCOUNTER — Inpatient Hospital Stay (HOSPITAL_BASED_OUTPATIENT_CLINIC_OR_DEPARTMENT_OTHER): Payer: BC Managed Care – PPO | Admitting: Internal Medicine

## 2019-12-25 DIAGNOSIS — D72829 Elevated white blood cell count, unspecified: Secondary | ICD-10-CM | POA: Diagnosis not present

## 2019-12-25 DIAGNOSIS — D473 Essential (hemorrhagic) thrombocythemia: Secondary | ICD-10-CM

## 2019-12-25 DIAGNOSIS — Z7982 Long term (current) use of aspirin: Secondary | ICD-10-CM | POA: Insufficient documentation

## 2019-12-25 LAB — CBC WITH DIFFERENTIAL/PLATELET
Abs Immature Granulocytes: 0.36 10*3/uL — ABNORMAL HIGH (ref 0.00–0.07)
Basophils Absolute: 0.1 10*3/uL (ref 0.0–0.1)
Basophils Relative: 1 %
Eosinophils Absolute: 0 10*3/uL (ref 0.0–0.5)
Eosinophils Relative: 0 %
HCT: 39.2 % (ref 39.0–52.0)
Hemoglobin: 12.5 g/dL — ABNORMAL LOW (ref 13.0–17.0)
Immature Granulocytes: 3 %
Lymphocytes Relative: 19 %
Lymphs Abs: 2.2 10*3/uL (ref 0.7–4.0)
MCH: 30.7 pg (ref 26.0–34.0)
MCHC: 31.9 g/dL (ref 30.0–36.0)
MCV: 96.3 fL (ref 80.0–100.0)
Monocytes Absolute: 1 10*3/uL (ref 0.1–1.0)
Monocytes Relative: 9 %
Neutro Abs: 7.9 10*3/uL — ABNORMAL HIGH (ref 1.7–7.7)
Neutrophils Relative %: 68 %
Platelets: 357 10*3/uL (ref 150–400)
RBC: 4.07 MIL/uL — ABNORMAL LOW (ref 4.22–5.81)
RDW: 14.4 % (ref 11.5–15.5)
WBC: 11.5 10*3/uL — ABNORMAL HIGH (ref 4.0–10.5)
nRBC: 0 % (ref 0.0–0.2)

## 2019-12-25 LAB — LACTATE DEHYDROGENASE: LDH: 304 U/L — ABNORMAL HIGH (ref 98–192)

## 2019-12-25 LAB — COMPREHENSIVE METABOLIC PANEL
ALT: 17 U/L (ref 0–44)
AST: 25 U/L (ref 15–41)
Albumin: 4.1 g/dL (ref 3.5–5.0)
Alkaline Phosphatase: 42 U/L (ref 38–126)
Anion gap: 9 (ref 5–15)
BUN: 24 mg/dL — ABNORMAL HIGH (ref 6–20)
CO2: 28 mmol/L (ref 22–32)
Calcium: 9.1 mg/dL (ref 8.9–10.3)
Chloride: 104 mmol/L (ref 98–111)
Creatinine, Ser: 1.08 mg/dL (ref 0.61–1.24)
GFR calc Af Amer: >60 mL/min (ref >60)
GFR calc non Af Amer: >60 mL/min (ref >60)
Glucose, Bld: 114 mg/dL — ABNORMAL HIGH (ref 70–99)
Potassium: 5.1 mmol/L (ref 3.5–5.1)
Sodium: 141 mmol/L (ref 135–145)
Total Bilirubin: 0.6 mg/dL (ref 0.3–1.2)
Total Protein: 6.6 g/dL (ref 6.5–8.1)

## 2019-12-25 NOTE — Assessment & Plan Note (Addendum)
#  Essential thrombocytosis-LOW risk; [positive for CALR mutation] no complications noted.  Asymptomatic.  Platelets today 357; on aspirin/anagrelide 1.5 mg once day.  Stable  # Discussed re: CALR/ good prognosis.  Continue anagrelide 1.5 mg total once a day.  # Mild Leucocytosis-monitor for now; immature granulocytes noted; March 2021- peripheral blood flow cytometry-1% marrow blasts; without any phenotypic aberrancy. Also LDH elevated at 304.  No major concerns for any transformation.  Recommend a bone marrow biopsy if any significant changes noted.  # DISPOSITION:  # Follow-up in 6 months; MD; labs-CBC CMP/LDH- Dr.B  Cc:   Dr. Gilbert. 

## 2019-12-25 NOTE — Progress Notes (Signed)
[place] OFFICE PROGRESS NOTE  Patient Care Team: Jerrol Banana., MD as PCP - General (Family Medicine) Cammie Sickle, MD as Consulting Physician (Hematology and Oncology)   SUMMARY OF HEMATOLOGIC/ONCOLOGIC HISTORY: Oncology History Overview Note  # 2005- Essential thrombocythemia [Bone marrow study on December 24, 2003 showed increased enlarged megakaryocytes, cellularity 50%, consistent with ET [Dr.Pandit]; Cytogenetics, flow study nd BCR/ABL study negative.January 2007: [id number] mutation negative]On Anagrelide therapy [place] 50m/d; CALR MUTATION POSITIVE [Nov 2017];   DIAGNOSIS: ET  STAGE: low risk        ;GOALS: control  CURRENT/MOST RECENT THERAPY : Anagrelid    Essential thrombocytosis ([place]   INTERVAL HISTORY:  A very pleasant 59year old male patient with above history of essential thrombocytosis currently on aspirin/anagrelide is here for follow-up.  Patient denies any nausea vomiting abdominal pain.  Denies any weight loss.  Denies any chest pain or shortness with cough.   Review of Systems  Constitutional: Negative for chills, diaphoresis, fever, malaise/fatigue and weight loss.  HENT: Negative for nosebleeds and sore throat.   Eyes: Negative for double vision.  Respiratory: Negative for cough, hemoptysis, sputum production, shortness of breath and wheezing.   Cardiovascular: Negative for chest pain, palpitations, orthopnea and leg swelling.  Gastrointestinal: Negative for abdominal pain, blood in stool, constipation, diarrhea, heartburn, melena, nausea and vomiting.  Genitourinary: Negative for dysuria, frequency and urgency.  Musculoskeletal: Negative for back pain and joint pain.  Skin: Negative.  Negative for itching and rash.  Neurological: Negative for dizziness, tingling, focal weakness, weakness and headaches.  Endo/Heme/Allergies: Does not bruise/bleed easily.  Psychiatric/Behavioral: Negative for depression. The patient is not  nervous/anxious and does not have insomnia.    PAST MEDICAL HISTORY :  Past Medical History:  Diagnosis Date  . Arthritis    osteoartiritis  . High platelet count     PAST SURGICAL HISTORY :   Past Surgical History:  Procedure Laterality Date  . COLONOSCOPY  2016  . CYSTECTOMY  years ago   pilonidal cyst removed  . KNEE SURGERY Right 20 years ago   arthrocscopy  . TOTAL KNEE ARTHROPLASTY Right 08/15/2016   Procedure: RIGHT TOTAL KNEE ARTHROPLASTY;  Surgeon: FGaynelle Arabian MD;  Location: [facility];  Service: Orthopedics;  Laterality: Right;    FAMILY HISTORY :  No family history on file.  SOCIAL HISTORY:   Social History   Tobacco Use  . Smoking status: Never Smoker  . Smokeless tobacco: Never Used  Substance Use Topics  . Alcohol use: Yes    Alcohol/week: 3.0 - 7.0 standard drinks    Types: 3 - 7 Glasses of wine per week  . Drug use: No    ALLERGIES:  is allergic to penicillins.  MEDICATIONS:  Current Outpatient Medications  Medication Sig Dispense Refill  . anagrelide (AGRYLIN) 0.5 MG capsule TAKE 2 CAPSULES EVERY MORNING AND TAKE 1CAPSULE EVERY EVENING 270 capsule 1  . aspirin EC 81 MG tablet Take 1 tablet by mouth daily.     No current facility-administered medications for this visit.    PHYSICAL EXAMINATION: ECOG PERFORMANCE STATUS: 0 - Asymptomatic  BP 135/64   Pulse 63   Temp (!) 96.9 F (36.1 C) (Tympanic)   Wt 163 lb 12.8 oz (74.3 kg)   SpO2 100% Comment: room air  BMI 23.50 kg/m   Filed Weights   12/25/19 1348  Weight: 163 lb 12.8 oz (74.3 kg)    Physical Exam  Constitutional: He is oriented to person, place, and  time and well-developed, well-nourished, and in no distress.  HENT:  Head: Normocephalic and atraumatic.  Mouth/Throat: Oropharynx is clear and moist. No oropharyngeal exudate.  Eyes: Pupils are equal, round, and reactive to light.  Cardiovascular: Normal rate and regular rhythm.  Pulmonary/Chest: Breath sounds normal. No  respiratory distress. He has no wheezes.  Abdominal: Soft. Bowel sounds are normal. He exhibits no distension and no mass. There is no abdominal tenderness. There is no rebound and no guarding.  Musculoskeletal:        General: No tenderness or edema. Normal range of motion.     Cervical back: Normal range of motion and neck supple.  Neurological: He is alert and oriented to person, place, and time.  Skin: Skin is warm.  Psychiatric: Affect normal.    LABORATORY DATA:  I have reviewed the data as listed    Component Value Date/Time   NA 141 12/25/2019 1324   NA 143 07/29/2016 0844   K 5.1 12/25/2019 1324   CL 104 12/25/2019 1324   CO2 28 12/25/2019 1324   GLUCOSE 114 (H) 12/25/2019 1324   BUN 24 (H) 12/25/2019 1324   BUN 20 07/29/2016 0844   CREATININE 1.08 12/25/2019 1324   CREATININE 1.10 04/25/2014 0823   CALCIUM 9.1 12/25/2019 1324   PROT 6.6 12/25/2019 1324   PROT 6.7 07/29/2016 0844   PROT 6.7 04/25/2014 0823   ALBUMIN 4.1 12/25/2019 1324   ALBUMIN 4.5 07/29/2016 0844   ALBUMIN 3.8 04/25/2014 0823   AST 25 12/25/2019 1324   AST 22 04/25/2014 0823   ALT 17 12/25/2019 1324   ALT 26 04/25/2014 0823   ALKPHOS 42 12/25/2019 1324   ALKPHOS 44 (L) 04/25/2014 0823   BILITOT 0.6 12/25/2019 1324   BILITOT 0.6 07/29/2016 0844   BILITOT 0.6 04/25/2014 0823   GFRNONAA >60 12/25/2019 1324   GFRNONAA >60 04/25/2014 0823   GFRNONAA >60 02/04/2014 0830   GFRAA >60 12/25/2019 1324   GFRAA >60 04/25/2014 0823   GFRAA >60 02/04/2014 0830    No results found for: SPEP, UPEP  Lab Results  Component Value Date   WBC 11.5 (H) 12/25/2019   NEUTROABS 7.9 (H) 12/25/2019   HGB 12.5 (L) 12/25/2019   HCT 39.2 12/25/2019   MCV 96.3 12/25/2019   PLT 357 12/25/2019      Chemistry      Component Value Date/Time   NA 141 12/25/2019 1324   NA 143 07/29/2016 0844   K 5.1 12/25/2019 1324   CL 104 12/25/2019 1324   CO2 28 12/25/2019 1324   BUN 24 (H) 12/25/2019 1324   BUN 20  07/29/2016 0844   CREATININE 1.08 12/25/2019 1324   CREATININE 1.10 04/25/2014 0823   GLU 89 01/01/2014 0000      Component Value Date/Time   CALCIUM 9.1 12/25/2019 1324   ALKPHOS 42 12/25/2019 1324   ALKPHOS 44 (L) 04/25/2014 0823   AST 25 12/25/2019 1324   AST 22 04/25/2014 0823   ALT 17 12/25/2019 1324   ALT 26 04/25/2014 0823   BILITOT 0.6 12/25/2019 1324   BILITOT 0.6 07/29/2016 0844   BILITOT 0.6 04/25/2014 0823         ASSESSMENT & PLAN:   Essential thrombocytosis ([place]) # Essential thrombocytosis-LOW risk; [positive for CALR mutation] no complications noted.  Asymptomatic.  Platelets today 357; on aspirin/anagrelide 1.5 mg once day.  Stable  # Discussed re: CALR/ good prognosis.  Continue anagrelide 1.5 mg total once a day.  #  Mild Leucocytosis-monitor for now; immature granulocytes noted; March 2021- peripheral blood flow cytometry-1% marrow blasts; without any phenotypic aberrancy. Also LDH elevated at 304.  No major concerns for any transformation.  Recommend a bone marrow biopsy if any significant changes noted.  # DISPOSITION:  # Follow-up in 6 months; MD; labs-CBC CMP/LDH- Dr.B  Cc:   Dr. Rosanna Randy.     Cammie Sickle, MD 12/26/2019 5:00 PM

## 2020-02-13 DIAGNOSIS — H0012 Chalazion right lower eyelid: Secondary | ICD-10-CM | POA: Diagnosis not present

## 2020-03-24 ENCOUNTER — Encounter: Payer: BC Managed Care – PPO | Admitting: Family Medicine

## 2020-03-26 ENCOUNTER — Encounter: Payer: Self-pay | Admitting: Internal Medicine

## 2020-03-27 ENCOUNTER — Encounter: Payer: BC Managed Care – PPO | Admitting: Family Medicine

## 2020-03-27 ENCOUNTER — Other Ambulatory Visit: Payer: Self-pay | Admitting: Internal Medicine

## 2020-04-07 ENCOUNTER — Ambulatory Visit (INDEPENDENT_AMBULATORY_CARE_PROVIDER_SITE_OTHER): Payer: BC Managed Care – PPO | Admitting: Family Medicine

## 2020-04-07 ENCOUNTER — Other Ambulatory Visit: Payer: Self-pay

## 2020-04-07 ENCOUNTER — Encounter: Payer: Self-pay | Admitting: Family Medicine

## 2020-04-07 VITALS — BP 126/58 | HR 61 | Temp 98.4°F | Wt 168.0 lb

## 2020-04-07 DIAGNOSIS — Z23 Encounter for immunization: Secondary | ICD-10-CM

## 2020-04-07 DIAGNOSIS — E7800 Pure hypercholesterolemia, unspecified: Secondary | ICD-10-CM | POA: Diagnosis not present

## 2020-04-07 DIAGNOSIS — R351 Nocturia: Secondary | ICD-10-CM | POA: Diagnosis not present

## 2020-04-07 DIAGNOSIS — Z1159 Encounter for screening for other viral diseases: Secondary | ICD-10-CM

## 2020-04-07 DIAGNOSIS — Z96651 Presence of right artificial knee joint: Secondary | ICD-10-CM

## 2020-04-07 DIAGNOSIS — Z114 Encounter for screening for human immunodeficiency virus [HIV]: Secondary | ICD-10-CM

## 2020-04-07 DIAGNOSIS — Z0000 Encounter for general adult medical examination without abnormal findings: Secondary | ICD-10-CM | POA: Diagnosis not present

## 2020-04-07 DIAGNOSIS — D473 Essential (hemorrhagic) thrombocythemia: Secondary | ICD-10-CM

## 2020-04-07 NOTE — Progress Notes (Signed)
Complete physical exam   Patient: Anthony Graves   DOB: [birth year]   59 y.o. Male  MRN: [id number] Visit Date: 04/07/2020  Today's healthcare provider: Vernie Murders, PA   Chief Complaint  Patient presents with   Annual Exam   Subjective    Anthony Graves is a 59 y.o. male who presents today for a complete physical exam.  He reports consuming a general diet.  He generally feels well. He reports sleeping well. He does have additional problems to discuss today.    Past Medical History:  Diagnosis Date   Arthritis    osteoartiritis   High platelet count    Past Surgical History:  Procedure Laterality Date   COLONOSCOPY  2016   CYSTECTOMY  years ago   pilonidal cyst removed   KNEE SURGERY Right 20 years ago   arthrocscopy   TOTAL KNEE ARTHROPLASTY Right 08/15/2016   Procedure: RIGHT TOTAL KNEE ARTHROPLASTY;  Surgeon: Gaynelle Arabian, MD;  Location: [facility];  Service: Orthopedics;  Laterality: Right;   Social History   Socioeconomic History   Marital status: Married    Spouse name: Not on file   Number of children: Not on file   Years of education: Not on file   Highest education level: Not on file  Occupational History   Not on file  Tobacco Use   Smoking status: Never Smoker   Smokeless tobacco: Never Used  Substance and Sexual Activity   Alcohol use: Yes    Alcohol/week: 3.0 - 7.0 standard drinks    Types: 3 - 7 Glasses of wine per week   Drug use: No   Sexual activity: Not on file  Other Topics Concern   Not on file  Social History Narrative   Not on file   Social Determinants of Health   Financial Resource Strain:    Difficulty of Paying Living Expenses: Not on file  Food Insecurity:    Worried About [place] in the Last Year: Not on file   Ran Out of Food in the Last Year: Not on file  Transportation Needs:    Lack of Transportation (Medical): Not on file   Lack of Transportation (Non-Medical): Not on file    Physical Activity:    Days of Exercise per Week: Not on file   Minutes of Exercise per Session: Not on file  Stress:    Feeling of Stress : Not on file  Social Connections:    Frequency of Communication with Friends and Family: Not on file   Frequency of Social Gatherings with Friends and Family: Not on file   Attends Religious Services: Not on file   Active Member of Clubs or Organizations: Not on file   Attends Archivist Meetings: Not on file   Marital Status: Not on file  Intimate Partner Violence:    Fear of Current or Ex-Partner: Not on file   Emotionally Abused: Not on file   Physically Abused: Not on file   Sexually Abused: Not on file   Family Status  Relation Name Status   Mother  Alive   Father  Alive   Sister  Alive   No family history on file. Allergies  Allergen Reactions   Penicillins     Reaction as a child Has patient had a PCN reaction causing immediate rash, facial/tongue/throat swelling, SOB or lightheadedness with hypotension:unsure Has patient had a PCN reaction causing severe rash involving mucus membranes or skin necrosis:unsure Has  patient had a PCN reaction that required hospitalization:No Has patient had a PCN reaction occurring within the last 10 years: No If all of the above answers are "NO", then may proceed with Cephalosporin use.     Patient Care Team: Jerrol Banana., MD as PCP - General (Family Medicine) Cammie Sickle, MD as Consulting Physician (Hematology and Oncology)   Medications: Outpatient Medications Prior to Visit  Medication Sig   anagrelide (AGRYLIN) 0.5 MG capsule TAKE 2 CAPSULES EVERY MORNING AND TAKE 1CAPSULE EVERY EVENING   aspirin EC 81 MG tablet Take 1 tablet by mouth daily.   No facility-administered medications prior to visit.    Review of Systems  Constitutional: Negative.   HENT: Negative.   Eyes: Negative.   Respiratory: Negative.   Cardiovascular: Negative.    Gastrointestinal: Negative.   Endocrine: Negative.   Genitourinary: Positive for frequency.  Musculoskeletal: Negative.   Skin: Negative.   Allergic/Immunologic: Negative.   Neurological: Negative.   Hematological: Negative.   Psychiatric/Behavioral: Negative.     Last CBC Lab Results  Component Value Date   WBC 11.5 (H) 12/25/2019   HGB 12.5 (L) 12/25/2019   HCT 39.2 12/25/2019   MCV 96.3 12/25/2019   MCH 30.7 12/25/2019   RDW 14.4 12/25/2019   PLT 357 40/04/2724   Last metabolic panel Lab Results  Component Value Date   GLUCOSE 114 (H) 12/25/2019   NA 141 12/25/2019   K 5.1 12/25/2019   CL 104 12/25/2019   CO2 28 12/25/2019   BUN 24 (H) 12/25/2019   CREATININE 1.08 12/25/2019   GFRNONAA >60 12/25/2019   GFRAA >60 12/25/2019   CALCIUM 9.1 12/25/2019   PROT 6.6 12/25/2019   ALBUMIN 4.1 12/25/2019   LABGLOB 2.2 07/29/2016   AGRATIO 2.0 07/29/2016   BILITOT 0.6 12/25/2019   ALKPHOS 42 12/25/2019   AST 25 12/25/2019   ALT 17 12/25/2019   ANIONGAP 9 12/25/2019   Last lipids Lab Results  Component Value Date   CHOL 220 (H) 07/29/2016   HDL 70 07/29/2016   LDLCALC 135 (H) 07/29/2016   TRIG 74 07/29/2016      Objective    BP (!) 126/58 (BP Location: Right Arm, Patient Position: Sitting, Cuff Size: Normal)    Pulse 61    Temp 98.4 F (36.9 C) (Oral)    Wt 168 lb (76.2 kg)    SpO2 100%    BMI 24.11 kg/m  BP Readings from Last 3 Encounters:  04/07/20 (!) 126/58  12/25/19 135/64  06/19/19 123/66   Wt Readings from Last 3 Encounters:  04/07/20 168 lb (76.2 kg)  12/25/19 163 lb 12.8 oz (74.3 kg)  06/19/19 170 lb 2 oz (77.2 kg)      Physical Exam Constitutional:      Appearance: Normal appearance. He is normal weight.  HENT:     Head: Normocephalic and atraumatic.     Right Ear: Tympanic membrane, ear canal and external ear normal.     Left Ear: Tympanic membrane, ear canal and external ear normal.     Nose: Nose normal.     Mouth/Throat:     Mouth:  Mucous membranes are moist.     Pharynx: Oropharynx is clear.  Eyes:     Extraocular Movements: Extraocular movements intact.     Conjunctiva/sclera: Conjunctivae normal.     Pupils: Pupils are equal, round, and reactive to light.  Cardiovascular:     Rate and Rhythm: Normal rate and regular rhythm.  Pulses: Normal pulses.     Heart sounds: Normal heart sounds.  Pulmonary:     Effort: Pulmonary effort is normal.     Breath sounds: Normal breath sounds.  Abdominal:     General: Abdomen is flat. Bowel sounds are normal.     Palpations: Abdomen is soft.  Genitourinary:    Penis: Normal.      Testes: Normal.     Prostate: Normal.     Rectum: Normal. Guaiac result negative.  Musculoskeletal:        General: Normal range of motion.     Cervical back: Normal range of motion and neck supple.     Comments: Well healed total right knee arthroplasty. Good ROM without pain.   Skin:    General: Skin is warm and dry.  Neurological:     General: No focal deficit present.     Mental Status: He is alert and oriented to person, place, and time. Mental status is at baseline.  Psychiatric:        Mood and Affect: Mood normal.        Behavior: Behavior normal.        Thought Content: Thought content normal.        Judgment: Judgment normal.     Last depression screening scores No flowsheet data found. Last fall risk screening No flowsheet data found. Last Audit-C alcohol use screening No flowsheet data found. A score of 3 or more in women, and 4 or more in men indicates increased risk for alcohol abuse, EXCEPT if all of the points are from question 1   No results found for any visits on 04/07/20.  Assessment & Plan    Routine Health Maintenance and Physical Exam  Exercise Activities and Dietary recommendations Goals   None     Immunization History  Administered Date(s) Administered   Influenza-Unspecified 05/27/2019   PFIZER SARS-COV-2 Vaccination 10/14/2019, 11/11/2019    Tdap 05/22/2006    Health Maintenance  Topic Date Due   Hepatitis C Screening  Never done   HIV Screening  Never done   COLONOSCOPY  Never done   TETANUS/TDAP  05/22/2016   INFLUENZA VACCINE  02/16/2020   COVID-19 Vaccine  Completed    Discussed health benefits of physical activity, and encouraged him to engage in regular exercise appropriate for his age and condition.  1. Annual physical exam Good general health. Given anticipatory counseling. Recommend regular exercise and low fat diet.  2. Essential thrombocytosis (HCC) No significant bruising, swelling, joint pain, dyspnea, hematuria or abdominal pain. Continue follow up with Dr. Rogue Bussing (hematologist) q 6 months as planned to get follow up CBC, CMP and LDH (06-24-20). Continues to take Agrylin 0.5 mg 2 caps q am and 1 q pm with ECASA 81 mg qd.  3. Nocturia Notice some decrease in stream and nocturia 2 times a night for the past several months. No hematuria or dysuria. Some urgency. Urinalysis normal today and minimal enlargement of prostate on DRE today. Check PSA. - PSA  4. Elevated LDL cholesterol level Stable weight with BMI of 24. Continue regular exercise and low fat diet. Recheck lipid panel. Lipid Panel     Component Value Date/Time   CHOL 220 (H) 07/29/2016 0844   TRIG 74 07/29/2016 0844   HDL 70 07/29/2016 0844   LDLCALC 135 (H) 07/29/2016 0844   LABVLDL 15 07/29/2016 0844   - Lipid panel  5. Need for influenza vaccination - Flu Vaccine QUAD 6+ mos PF IM (Fluarix  Quad PF)  6. Need for hepatitis C screening test - Hepatitis C antibody  7. Screening for HIV (human immunodeficiency virus) - HIV Antibody (routine testing w rflx)  8. 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 Maureen Ralphs (orthopedist) on 08-15-16. No pain or significant swelling after playing tennis. Good ROM today.   No follow-ups on file.     Andres Shad, PA, have reviewed all documentation for this  visit. The documentation on 04/07/20 for the exam, diagnosis, procedures, and orders are all accurate and complete.    Vernie Murders, [place] [phone] (phone) [phone] (fax)  [place]

## 2020-04-09 NOTE — Addendum Note (Signed)
Addended by: Althea Charon D on: 04/09/2020 08:29 AM   Modules accepted: Orders

## 2020-04-10 DIAGNOSIS — Z114 Encounter for screening for human immunodeficiency virus [HIV]: Secondary | ICD-10-CM | POA: Diagnosis not present

## 2020-04-10 DIAGNOSIS — Z1159 Encounter for screening for other viral diseases: Secondary | ICD-10-CM | POA: Diagnosis not present

## 2020-04-10 DIAGNOSIS — R351 Nocturia: Secondary | ICD-10-CM | POA: Diagnosis not present

## 2020-04-10 DIAGNOSIS — E7800 Pure hypercholesterolemia, unspecified: Secondary | ICD-10-CM | POA: Diagnosis not present

## 2020-04-11 LAB — LIPID PANEL
Chol/HDL Ratio: 2.9 ratio (ref 0.0–5.0)
Cholesterol, Total: 215 mg/dL — ABNORMAL HIGH (ref 100–199)
HDL: 73 mg/dL (ref >39)
LDL Chol Calc (NIH): 127 mg/dL — ABNORMAL HIGH (ref 0–99)
Triglycerides: 83 mg/dL (ref 0–149)
VLDL Cholesterol Cal: 15 mg/dL (ref 5–40)

## 2020-04-11 LAB — PSA: Prostate Specific Ag, Serum: 6.5 ng/mL — ABNORMAL HIGH (ref 0.0–4.0)

## 2020-04-11 LAB — HIV ANTIBODY (ROUTINE TESTING W REFLEX): HIV Screen 4th Generation wRfx: NONREACTIVE

## 2020-04-11 LAB — HEPATITIS C ANTIBODY: Hep C Virus Ab: <0.1 s/co ratio (ref 0.0–0.9)

## 2020-04-15 ENCOUNTER — Telehealth: Payer: Self-pay

## 2020-04-15 NOTE — Telephone Encounter (Signed)
-----   Message from Margo Common, [place] sent at 04/14/2020  8:57 AM EDT ----- Total cholesterol and LDL high and need to work on low fat diet, regular exercise as tolerated. PSA high and recommend urology referral.

## 2020-04-15 NOTE — Telephone Encounter (Signed)
Pt given result and recommendations per Simona Huh Chrismon, "Total cholesterol and LDL high and need to work on low fat diet, regular exercise as tolerated. PSA high and recommend urology referral"; the verbalized understanding and would like for a MyChart message sent to him regarding his recommended choices of urologists; the can be contacted at [phone]; will route to office for final disposition.

## 2020-04-15 NOTE — Telephone Encounter (Signed)
LMTCB, [facility] Triage Nurse may give patient results  

## 2020-04-16 ENCOUNTER — Other Ambulatory Visit: Payer: Self-pay

## 2020-04-16 DIAGNOSIS — R972 Elevated prostate specific antigen [PSA]: Secondary | ICD-10-CM

## 2020-04-16 NOTE — Telephone Encounter (Signed)
Unless he has a specific preference for urology, Parke Poisson will check with his insurance to see who is in his network to get the best coverage for him, then, she will contact him about the appointment time.

## 2020-04-29 ENCOUNTER — Encounter: Payer: Self-pay | Admitting: Urology

## 2020-04-29 ENCOUNTER — Ambulatory Visit (INDEPENDENT_AMBULATORY_CARE_PROVIDER_SITE_OTHER): Payer: BC Managed Care – PPO | Admitting: Urology

## 2020-04-29 ENCOUNTER — Other Ambulatory Visit: Payer: Self-pay

## 2020-04-29 VITALS — BP 149/64 | HR 70 | Ht 70.0 in | Wt 170.0 lb

## 2020-04-29 DIAGNOSIS — R972 Elevated prostate specific antigen [PSA]: Secondary | ICD-10-CM | POA: Diagnosis not present

## 2020-04-29 DIAGNOSIS — R8281 Pyuria: Secondary | ICD-10-CM | POA: Diagnosis not present

## 2020-04-29 DIAGNOSIS — N400 Enlarged prostate without lower urinary tract symptoms: Secondary | ICD-10-CM | POA: Diagnosis not present

## 2020-04-29 LAB — URINALYSIS, COMPLETE
Bilirubin, UA: NEGATIVE
Glucose, UA: NEGATIVE
Ketones, UA: NEGATIVE
Nitrite, UA: NEGATIVE
Protein,UA: NEGATIVE
RBC, UA: NEGATIVE
Specific Gravity, UA: 1.015 (ref 1.005–1.030)
Urobilinogen, Ur: 0.2 mg/dL (ref 0.2–1.0)
pH, UA: 5.5 (ref 5.0–7.5)

## 2020-04-29 LAB — MICROSCOPIC EXAMINATION
Bacteria, UA: NONE SEEN
Epithelial Cells (non renal): NONE SEEN /hpf (ref 0–10)
RBC, Urine: NONE SEEN /hpf (ref 0–2)

## 2020-04-29 MED ORDER — TAMSULOSIN HCL 0.4 MG PO CAPS: 0.4000 mg | ORAL_CAPSULE | Freq: Every day | ORAL | 0 refills | Status: DC

## 2020-04-29 NOTE — Progress Notes (Signed)
04/29/2020 8:39 AM   Nicki Reaper T Lienau February 10, 1961 [id number]  Referring provider: Jerrol Banana., MD [address] [place],  [place] [address]  Chief Complaint  Patient presents with  . Elevated PSA    HPI: Kiven Vangilder is a 59 y.o. male seen at the request of Dr. Rosanna Randy for evaluation of an elevated PSA.   PSA drawn 04/10/2020 was elevated at 6.5  Prior PSA January 2018 was 4.7  Over the last few months he has noted worsening lower urinary tract symptoms and made an appointment for an annual physical  Symptoms include urinary frequency, urgency, weak urinary stream, hesitancy and nocturia x1-2  Denies dysuria, gross hematuria  Denies flank, abdominal or pelvic pain  No previous history of urologic problems   PMH: Past Medical History:  Diagnosis Date  . Arthritis    osteoartiritis  . High platelet count     Surgical History: Past Surgical History:  Procedure Laterality Date  . COLONOSCOPY  2016  . CYSTECTOMY  years ago   pilonidal cyst removed  . KNEE SURGERY Right 20 years ago   arthrocscopy  . TOTAL KNEE ARTHROPLASTY Right 08/15/2016   Procedure: RIGHT TOTAL KNEE ARTHROPLASTY;  Surgeon: Gaynelle Arabian, MD;  Location: [facility];  Service: Orthopedics;  Laterality: Right;    Home Medications:  Allergies as of 04/29/2020      Reactions   Penicillins    Reaction as a child Has patient had a PCN reaction causing immediate rash, facial/tongue/throat swelling, SOB or lightheadedness with hypotension:unsure Has patient had a PCN reaction causing severe rash involving mucus membranes or skin necrosis:unsure Has patient had a PCN reaction that required hospitalization:No Has patient had a PCN reaction occurring within the last 10 years: No If all of the above answers are "NO", then may proceed with Cephalosporin use.      Medication List       Accurate as of April 29, 2020  8:39 AM. If you have any questions, ask your nurse or doctor.          anagrelide 0.5 MG capsule Commonly known as: AGRYLIN TAKE 2 CAPSULES EVERY MORNING AND TAKE 1CAPSULE EVERY EVENING   aspirin EC 81 MG tablet Take 1 tablet by mouth daily.       Allergies:  Allergies  Allergen Reactions  . Penicillins     Reaction as a child Has patient had a PCN reaction causing immediate rash, facial/tongue/throat swelling, SOB or lightheadedness with hypotension:unsure Has patient had a PCN reaction causing severe rash involving mucus membranes or skin necrosis:unsure Has patient had a PCN reaction that required hospitalization:No Has patient had a PCN reaction occurring within the last 10 years: No If all of the above answers are "NO", then may proceed with Cephalosporin use.     Family History: History reviewed. No pertinent family history.  Social History:  reports that he has never smoked. He has never used smokeless tobacco. He reports current alcohol use of about 3.0 - 7.0 standard drinks of alcohol per week. He reports that he does not use drugs.   Physical Exam: BP (!) 149/64   Pulse 70   Ht 5\' 10"  (1.778 m)   Wt 170 lb (77.1 kg)   BMI 24.39 kg/m   Constitutional:  Alert and oriented, No acute distress. HEENT: [place]oley AT, moist mucus membranes.  Trachea midline, no masses. Cardiovascular: No clubbing, cyanosis, or edema. Respiratory: Normal respiratory effort, no increased work of breathing. GU: Prostate 50 g,  smooth without nodules Skin: No rashes, bruises or suspicious lesions. Neurologic: Grossly intact, no focal deficits, moving all 4 extremities. Psychiatric: Normal mood and affect.  Laboratory Data:  Urinalysis Dipstick trace leukocytes Microscopy 6-10 WBC  Assessment & Plan:    1.  Elevated PSA  Although PSA is a prostate cancer screening test he was informed that cancer is not the most common cause of an elevated PSA. Other potential causes including BPH and inflammation were discussed. He was informed that the only way to  adequately diagnose prostate cancer would be a transrectal ultrasound and biopsy of the prostate. The procedure was discussed including potential risks of bleeding and infection/sepsis. He was also informed that a negative biopsy does not conclusively rule out the possibility that prostate cancer may be present and that continued monitoring is required. The use of newer adjunctive blood tests including PHI and 4kScore were discussed. The use of multiparametric prostate MRI was also discussed however is not typically used for initial evaluation of an elevated PSA. Continued periodic surveillance was also discussed.  He has recently noted worsening lower urinary tract symptoms which could represent inflammatory prostatitis.  Rx tamsulosin sent to pharmacy  Urinalysis today also showed 6-10 WBCs and urine culture was ordered  Follow-up lab visit with repeat PSA in 4 weeks.   Abbie Sons, [place] [address] [place], [place] [address] [phone]

## 2020-05-02 LAB — CULTURE, URINE COMPREHENSIVE

## 2020-05-03 ENCOUNTER — Other Ambulatory Visit: Payer: Self-pay | Admitting: Urology

## 2020-05-03 MED ORDER — SULFAMETHOXAZOLE-TRIMETHOPRIM 800-160 MG PO TABS: 1.0000 | ORAL_TABLET | Freq: Two times a day (BID) | ORAL | 0 refills | Status: DC

## 2020-05-04 ENCOUNTER — Telehealth: Payer: Self-pay | Admitting: Family Medicine

## 2020-05-04 NOTE — Telephone Encounter (Signed)
-----   Message from Abbie Sons, MD sent at 05/03/2020  1:35 PM EDT ----- Urine culture grew a low level of bacteria.  Antibiotic Rx was sent to pharmacy.  Repeat PSA as scheduled

## 2020-05-04 NOTE — Telephone Encounter (Signed)
Patient notified and will pick up ABX 

## 2020-05-27 ENCOUNTER — Encounter: Payer: Self-pay | Admitting: Urology

## 2020-05-28 ENCOUNTER — Other Ambulatory Visit: Payer: Self-pay | Admitting: *Deleted

## 2020-05-28 MED ORDER — TAMSULOSIN HCL 0.4 MG PO CAPS
0.4000 mg | ORAL_CAPSULE | Freq: Every day | ORAL | 0 refills | Status: DC
Start: 2020-05-28 — End: 2020-06-30

## 2020-06-02 ENCOUNTER — Ambulatory Visit: Payer: BC Managed Care – PPO | Admitting: Dermatology

## 2020-06-03 ENCOUNTER — Other Ambulatory Visit: Payer: Self-pay

## 2020-06-04 ENCOUNTER — Other Ambulatory Visit: Payer: Self-pay

## 2020-06-04 DIAGNOSIS — R972 Elevated prostate specific antigen [PSA]: Secondary | ICD-10-CM

## 2020-06-08 ENCOUNTER — Other Ambulatory Visit: Payer: BC Managed Care – PPO

## 2020-06-08 ENCOUNTER — Other Ambulatory Visit: Payer: Self-pay

## 2020-06-08 DIAGNOSIS — R972 Elevated prostate specific antigen [PSA]: Secondary | ICD-10-CM

## 2020-06-09 ENCOUNTER — Telehealth: Payer: Self-pay | Admitting: Urology

## 2020-06-09 DIAGNOSIS — H43813 Vitreous degeneration, bilateral: Secondary | ICD-10-CM | POA: Diagnosis not present

## 2020-06-09 LAB — PSA: Prostate Specific Ag, Serum: 6.5 ng/mL — ABNORMAL HIGH (ref 0.0–4.0)

## 2020-06-09 NOTE — Telephone Encounter (Signed)
Repeat PSA persistently elevated at 6.5.  Would recommend scheduling prostate biopsy.  We did also discuss the option of MRI.  Please let me know if he has any questions.

## 2020-06-10 NOTE — Telephone Encounter (Signed)
Patient will think it over and call us back .

## 2020-06-18 ENCOUNTER — Other Ambulatory Visit: Payer: BC Managed Care – PPO

## 2020-06-18 ENCOUNTER — Ambulatory Visit: Payer: BC Managed Care – PPO | Admitting: Internal Medicine

## 2020-06-24 ENCOUNTER — Encounter: Payer: Self-pay | Admitting: Internal Medicine

## 2020-06-24 ENCOUNTER — Inpatient Hospital Stay (HOSPITAL_BASED_OUTPATIENT_CLINIC_OR_DEPARTMENT_OTHER): Payer: BC Managed Care – PPO | Admitting: Internal Medicine

## 2020-06-24 ENCOUNTER — Inpatient Hospital Stay: Payer: BC Managed Care – PPO | Attending: Internal Medicine

## 2020-06-24 ENCOUNTER — Other Ambulatory Visit: Payer: Self-pay

## 2020-06-24 DIAGNOSIS — D473 Essential (hemorrhagic) thrombocythemia: Secondary | ICD-10-CM | POA: Diagnosis not present

## 2020-06-24 DIAGNOSIS — D72829 Elevated white blood cell count, unspecified: Secondary | ICD-10-CM | POA: Insufficient documentation

## 2020-06-24 LAB — CBC WITH DIFFERENTIAL/PLATELET
Abs Immature Granulocytes: 0.49 10*3/uL — ABNORMAL HIGH (ref 0.00–0.07)
Basophils Absolute: 0.2 10*3/uL — ABNORMAL HIGH (ref 0.0–0.1)
Basophils Relative: 1 %
Eosinophils Absolute: 0.1 10*3/uL (ref 0.0–0.5)
Eosinophils Relative: 0 %
HCT: 41.5 % (ref 39.0–52.0)
Hemoglobin: 13.1 g/dL (ref 13.0–17.0)
Immature Granulocytes: 4 %
Lymphocytes Relative: 19 %
Lymphs Abs: 2.2 10*3/uL (ref 0.7–4.0)
MCH: 31 pg (ref 26.0–34.0)
MCHC: 31.6 g/dL (ref 30.0–36.0)
MCV: 98.1 fL (ref 80.0–100.0)
Monocytes Absolute: 1.3 10*3/uL — ABNORMAL HIGH (ref 0.1–1.0)
Monocytes Relative: 11 %
Neutro Abs: 7.9 10*3/uL — ABNORMAL HIGH (ref 1.7–7.7)
Neutrophils Relative %: 65 %
Platelets: 313 10*3/uL (ref 150–400)
RBC: 4.23 MIL/uL (ref 4.22–5.81)
RDW: 14.6 % (ref 11.5–15.5)
WBC: 12.1 10*3/uL — ABNORMAL HIGH (ref 4.0–10.5)
nRBC: 0.2 % (ref 0.0–0.2)

## 2020-06-24 LAB — COMPREHENSIVE METABOLIC PANEL
ALT: 26 U/L (ref 0–44)
AST: 32 U/L (ref 15–41)
Albumin: 4.2 g/dL (ref 3.5–5.0)
Alkaline Phosphatase: 43 U/L (ref 38–126)
Anion gap: 11 (ref 5–15)
BUN: 22 mg/dL — ABNORMAL HIGH (ref 6–20)
CO2: 27 mmol/L (ref 22–32)
Calcium: 9.1 mg/dL (ref 8.9–10.3)
Chloride: 102 mmol/L (ref 98–111)
Creatinine, Ser: 1.13 mg/dL (ref 0.61–1.24)
GFR, Estimated: >60 mL/min (ref >60)
Glucose, Bld: 103 mg/dL — ABNORMAL HIGH (ref 70–99)
Potassium: 4.8 mmol/L (ref 3.5–5.1)
Sodium: 140 mmol/L (ref 135–145)
Total Bilirubin: 0.7 mg/dL (ref 0.3–1.2)
Total Protein: 6.6 g/dL (ref 6.5–8.1)

## 2020-06-24 LAB — LACTATE DEHYDROGENASE: LDH: 357 U/L — ABNORMAL HIGH (ref 98–192)

## 2020-06-24 NOTE — Progress Notes (Signed)
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  Patient denies any abdominal pain nausea vomiting.  Appetite is good.  No weight loss.  No fatigue.  No chest pain or shortness of breath.   Patient notes to have slightly elevated PSA for which he has been evaluated by urology; [facility]   Review of Systems  Constitutional: Negative for chills, diaphoresis, fever, malaise/fatigue and weight loss.  HENT: Negative for nosebleeds and sore throat.   Eyes: Negative for double vision.  Respiratory: Negative for cough, hemoptysis, sputum production, shortness of breath and wheezing.   Cardiovascular: Negative for chest pain, palpitations, orthopnea and leg swelling.  Gastrointestinal: Negative for abdominal pain, blood in stool, constipation, diarrhea, heartburn, melena, nausea and vomiting.  Genitourinary: Negative for dysuria, frequency and urgency.  Musculoskeletal: Negative for back pain and joint pain.  Skin: Negative.  Negative for itching and rash.  Neurological: Negative for dizziness, tingling, focal weakness, weakness and  headaches.  Endo/Heme/Allergies: Does not bruise/bleed easily.  Psychiatric/Behavioral: Negative for depression. The patient is not nervous/anxious and does not have insomnia.    PAST MEDICAL HISTORY :  Past Medical History:  Diagnosis Date  . Arthritis    osteoartiritis  . Basal cell carcinoma 10/12/2009   Left post. shoulder sup. lateral scapula, 3cm med. to hemangioma.   . Basal cell carcinoma 02/26/2015   Right temple. Nodular.  . Basal cell carcinoma 03/21/2019   Right inf. med. pectoral. Nodular. EDC  . Basal cell carcinoma 03/21/2019   Left lat. abdomen parallel lat. to umbilicus. Micronodular. EDC.  .[name] KitchenDysplastic nevus 01/28/2008   Left upper back medial sup scapula. Moderate to severe atypia, margins involved. Excised 03/04/2008, persistent DN, close to margin.  .[name] KitchenDysplastic nevus 07/01/2010   Left low back paraspinal lat. Mild atypiua, margins involved.   .[name] KitchenDysplastic nevus 07/01/2010   Left low back paraspinal me. Mild atypia, margins involved.   . High platelet count     PAST SURGICAL HISTORY :   Past Surgical History:  Procedure Laterality Date  . COLONOSCOPY  2016  . CYSTECTOMY  years ago   pilonidal cyst removed  . KNEE SURGERY Right 20 years ago   arthrocscopy  . TOTAL KNEE ARTHROPLASTY Right 08/15/2016   Procedure: RIGHT TOTAL KNEE ARTHROPLASTY;  Surgeon: FGaynelle Arabian MD;  Location: [facility];  Service: Orthopedics;  Laterality: Right;    FAMILY HISTORY :  No family history on file.  SOCIAL HISTORY:   Social History   Tobacco Use  . Smoking status: Never Smoker  . Smokeless tobacco: Never Used  Substance Use Topics  . Alcohol use: Yes    Alcohol/week: 3.0 - 7.0 standard drinks    Types: 3 - 7 Glasses of wine  per week  . Drug use: No    ALLERGIES:  is allergic to penicillins.  MEDICATIONS:  Current Outpatient Medications  Medication Sig Dispense Refill  . tamsulosin (FLOMAX) 0.4 MG CAPS capsule Take 1 capsule (0.4 mg total) by mouth daily. 30  capsule 0  . anagrelide (AGRYLIN) 0.5 MG capsule TAKE 2 CAPSULES EVERY MORNING AND TAKE 1CAPSULE EVERY EVENING 270 capsule 1  . aspirin EC 81 MG tablet Take 1 tablet by mouth daily.     No current facility-administered medications for this visit.    PHYSICAL EXAMINATION: ECOG PERFORMANCE STATUS: 0 - Asymptomatic  BP 138/69 (BP Location: Right Arm, Patient Position: Sitting, Cuff Size: Normal)   Pulse 62   Temp (!) 96.6 F (35.9 C) (Tympanic)   Resp 16   Ht _0  (1.778 m)   Wt 168 lb 12.8 oz (76.6 kg)   SpO2 100%   BMI 24.22 kg/m   Filed Weights   06/24/20 1030  Weight: 168 lb 12.8 oz (76.6 kg)    Physical Exam HENT:     Head: Normocephalic and atraumatic.     Mouth/Throat:     Pharynx: No oropharyngeal exudate.  Eyes:     Pupils: Pupils are equal, round, and reactive to light.  Cardiovascular:     Rate and Rhythm: Normal rate and regular rhythm.  Pulmonary:     Effort: No respiratory distress.     Breath sounds: Normal breath sounds. No wheezing.  Abdominal:     General: Bowel sounds are normal. There is no distension.     Palpations: Abdomen is soft. There is no mass.     Tenderness: There is no abdominal tenderness. There is no guarding or rebound.  Musculoskeletal:        General: No tenderness. Normal range of motion.     Cervical back: Normal range of motion and neck supple.  Skin:    General: Skin is warm.  Neurological:     Mental Status: He is alert and oriented to person, place, and time.  Psychiatric:        Mood and Affect: Affect normal.     LABORATORY DATA:  I have reviewed the data as listed    Component Value Date/Time   NA 140 06/24/2020 1014   NA 143 07/29/2016 0844   K 4.8 06/24/2020 1014   CL 102 06/24/2020 1014   CO2 27 06/24/2020 1014   GLUCOSE 103 (H) 06/24/2020 1014   BUN 22 (H) 06/24/2020 1014   BUN 20 07/29/2016 0844   CREATININE 1.13 06/24/2020 1014   CREATININE 1.10 04/25/2014 0823   CALCIUM 9.1 06/24/2020 1014   PROT  6.6 06/24/2020 1014   PROT 6.7 07/29/2016 0844   PROT 6.7 04/25/2014 0823   ALBUMIN 4.2 06/24/2020 1014   ALBUMIN 4.5 07/29/2016 0844   ALBUMIN 3.8 04/25/2014 0823   AST 32 06/24/2020 1014   AST 22 04/25/2014 0823   ALT 26 06/24/2020 1014   ALT 26 04/25/2014 0823   ALKPHOS 43 06/24/2020 1014   ALKPHOS 44 (L) 04/25/2014 0823   BILITOT 0.7 06/24/2020 1014   BILITOT 0.6 07/29/2016 0844   BILITOT 0.6 04/25/2014 0823   GFRNONAA >60 06/24/2020 1014   GFRNONAA >60 04/25/2014 0823   GFRNONAA >60 02/04/2014 0830   GFRAA >60 12/25/2019 1324   GFRAA >60 04/25/2014 0823   GFRAA >60 02/04/2014 0830    No results found for: SPEP, UPEP  Lab Results  Component Value Date   WBC 12.1 (  H) 06/24/2020   NEUTROABS 7.9 (H) 06/24/2020   HGB 13.1 06/24/2020   HCT 41.5 06/24/2020   MCV 98.1 06/24/2020   PLT 313 06/24/2020      Chemistry      Component Value Date/Time   NA 140 06/24/2020 1014   NA 143 07/29/2016 0844   K 4.8 06/24/2020 1014   CL 102 06/24/2020 1014   CO2 27 06/24/2020 1014   BUN 22 (H) 06/24/2020 1014   BUN 20 07/29/2016 0844   CREATININE 1.13 06/24/2020 1014   CREATININE 1.10 04/25/2014 0823   GLU 89 01/01/2014 0000      Component Value Date/Time   CALCIUM 9.1 06/24/2020 1014   ALKPHOS 43 06/24/2020 1014   ALKPHOS 44 (L) 04/25/2014 0823   AST 32 06/24/2020 1014   AST 22 04/25/2014 0823   ALT 26 06/24/2020 1014   ALT 26 04/25/2014 0823   BILITOT 0.7 06/24/2020 1014   BILITOT 0.6 07/29/2016 0844   BILITOT 0.6 04/25/2014 0823         ASSESSMENT & PLAN:   Essential thrombocytosis ([place]) # Essential thrombocytosis-LOW risk; [positive for CALR mutation] no complications noted.  Asymptomatic.  Platelets today 357; on aspirin/anagrelide 1.5 mg once day. STABLE.  Continue anagrelide.   # Mild Leucocytosis-March 2021- peripheral blood flow cytometry-1% marrow blasts; without any phenotypic aberrancy. Also LDH elevated at 357.  Overall stable.  Risk of transformation  to secondary myelofibrosis was discussed.  Discussed that if LDH continues to go up/leukocytosis worse-would recommend a bone marrow biopsy. However patient asymptomatic.  Continue monitor for now.    # Elevated PSA ~ [Dr.Stoiff]- MRI/[facility].   # DISPOSITION:  # Follow-up in 6 months; MD; labs-CBC CMP/LDH- Dr.B  Cc:   Dr. Rosanna Randy.     Anthony Sickle, MD 06/24/2020 7:16 PM

## 2020-06-24 NOTE — Assessment & Plan Note (Addendum)
#  Essential thrombocytosis-LOW risk; [positive for CALR mutation] no complications noted.  Asymptomatic.  Platelets today 357; on aspirin/anagrelide 1.5 mg once day. STABLE.  Continue anagrelide.   # Mild Leucocytosis-March 2021- peripheral blood flow cytometry-1% marrow blasts; without any phenotypic aberrancy. Also LDH elevated at 357.  Overall stable.  Risk of transformation to secondary myelofibrosis was discussed.  Discussed that if LDH continues to go up/leukocytosis worse-would recommend a bone marrow biopsy. However patient asymptomatic.  Continue monitor for now.    # Elevated PSA ~ [Dr.Stoiff]- MRI/[facility].   # DISPOSITION:  # Follow-up in 6 months; MD; labs-CBC CMP/LDH- Dr.B  Cc:   Dr. Rosanna Randy.

## 2020-06-30 ENCOUNTER — Encounter: Payer: Self-pay | Admitting: Urology

## 2020-06-30 ENCOUNTER — Other Ambulatory Visit: Payer: Self-pay | Admitting: Urology

## 2020-06-30 MED ORDER — TAMSULOSIN HCL 0.4 MG PO CAPS
0.4000 mg | ORAL_CAPSULE | Freq: Every day | ORAL | 3 refills | Status: DC
Start: 2020-06-30 — End: 2020-09-28

## 2020-07-13 ENCOUNTER — Encounter: Payer: Self-pay | Admitting: Internal Medicine

## 2020-07-14 ENCOUNTER — Other Ambulatory Visit: Payer: Self-pay

## 2020-07-14 ENCOUNTER — Telehealth (INDEPENDENT_AMBULATORY_CARE_PROVIDER_SITE_OTHER): Payer: BC Managed Care – PPO | Admitting: Family Medicine

## 2020-07-14 ENCOUNTER — Encounter: Payer: Self-pay | Admitting: Family Medicine

## 2020-07-14 DIAGNOSIS — J029 Acute pharyngitis, unspecified: Secondary | ICD-10-CM

## 2020-07-14 DIAGNOSIS — U071 COVID-19: Secondary | ICD-10-CM

## 2020-07-14 MED ORDER — PREDNISONE 5 MG PO TABS: ORAL_TABLET | ORAL | 0 refills | Status: DC

## 2020-07-14 MED ORDER — AZITHROMYCIN 250 MG PO TABS: ORAL_TABLET | ORAL | 0 refills | Status: DC

## 2020-07-14 NOTE — Progress Notes (Signed)
Telephone Visit    Virtual Visit via Video Note   This visit type was conducted due to national recommendations for restrictions regarding the COVID-19 Pandemic (e.g. social distancing) in an effort to limit this patient's exposure and mitigate transmission in our community. This patient is at least at moderate risk for complications without adequate follow up. This format is felt to be most appropriate for this patient at this time. Physical exam was limited by quality of the  audio technology used for the visit.   Patient location: Home Provider location: Office  I discussed the limitations of evaluation and management by telemedicine and the availability of in person appointments. The patient expressed understanding and agreed to proceed.  Patient: Anthony Graves   DOB: 04-06-1961   59 y.o. Male  MRN: [id number] Visit Date: 07/14/2020  Today's healthcare provider: Dortha Kern, PA-C   No chief complaint on file.  Subjective    Upper Respiratory Infection: Patient complains of symptoms of a URI. Symptoms include sore throat. Onset of symptoms was 4 days ago, unchanged since that time. He is drinking plenty of fluids. Evaluation to date: none. Treatment to date: advil.   covid symptoms last week and tested pos for covid Sunday using an at home test. Pt has severe sore throat x 4 days. Wants to know if there is anything he can do about that. 3 advil tablets has helped to aleviate the pain. Pt has potential allergy to penicillin.  Past Medical History:  Diagnosis Date  . Arthritis    osteoartiritis  . Basal cell carcinoma 10/12/2009   Left post. shoulder sup. lateral scapula, 3cm med. to hemangioma.   . Basal cell carcinoma 02/26/2015   Right temple. Nodular.  . Basal cell carcinoma 03/21/2019   Right inf. med. pectoral. Nodular. EDC  . Basal cell carcinoma 03/21/2019   Left lat. abdomen parallel lat. to umbilicus. Micronodular. EDC.  [name] Kitchen Dysplastic nevus 01/28/2008   Left  upper back medial sup scapula. Moderate to severe atypia, margins involved. Excised 03/04/2008, persistent DN, close to margin.  [name] Kitchen Dysplastic nevus 07/01/2010   Left low back paraspinal lat. Mild atypiua, margins involved.   [name] Kitchen Dysplastic nevus 07/01/2010   Left low back paraspinal me. Mild atypia, margins involved.   . High platelet count    Past Surgical History:  Procedure Laterality Date  . COLONOSCOPY  2016  . CYSTECTOMY  years ago   pilonidal cyst removed  . KNEE SURGERY Right 20 years ago   arthrocscopy  . TOTAL KNEE ARTHROPLASTY Right 08/15/2016   Procedure: RIGHT TOTAL KNEE ARTHROPLASTY;  Surgeon: Ollen Gross, MD;  Location: [facility];  Service: Orthopedics;  Laterality: Right;   Social History   Tobacco Use  . Smoking status: Never Smoker  . Smokeless tobacco: Never Used  Substance Use Topics  . Alcohol use: Yes    Alcohol/week: 3.0 - 7.0 standard drinks    Types: 3 - 7 Glasses of wine per week  . Drug use: No   No family history on file. Allergies  Allergen Reactions  . Penicillins     Reaction as a child Has patient had a PCN reaction causing immediate rash, facial/tongue/throat swelling, SOB or lightheadedness with hypotension:unsure Has patient had a PCN reaction causing severe rash involving mucus membranes or skin necrosis:unsure Has patient had a PCN reaction that required hospitalization:No Has patient had a PCN reaction occurring within the last 10 years: No If all of the above answers are "NO", then may proceed  with Cephalosporin use.       Medications: Outpatient Medications Prior to Visit  Medication Sig  . anagrelide (AGRYLIN) 0.5 MG capsule TAKE 2 CAPSULES EVERY MORNING AND TAKE 1CAPSULE EVERY EVENING  . aspirin EC 81 MG tablet Take 1 tablet by mouth daily.  . tamsulosin (FLOMAX) 0.4 MG CAPS capsule Take 1 capsule (0.4 mg total) by mouth daily.   No facility-administered medications prior to visit.    Review of Systems    Objective    There  were no vitals taken for this visit.   During telephonic interview, he had a raspy voice with occasional coughing. No wheezes audible. Oriented and cooperative.     Assessment & Plan     1. Acute pharyngitis, unspecified etiology Developed sore throat, PND and chills 6 days ago. No loss of taste or documented fever. Positive COVID test on 07-12-20. Throat still very sore. Will treat with antibiotic and prednisone taper. No dyspnea or choking when eating. Normal sense of taste and smell. May use throat lozenge or saltwater gargles for discomfort. Recheck prn. - predniSONE (DELTASONE) 5 MG tablet; Taper down by 1 tablet by mouth daily starting at 6 day 1, then, 5 day 2, 4 day 3, 3 day 4, 2 day 5 and 1 day 6. Divide dosage among meals and bedtime daily.  Dispense: 21 tablet; Refill: 0 - azithromycin (ZITHROMAX) 250 MG tablet; Take 2 tablets by mouth today, then 1 daily for 4 days.  Dispense: 6 tablet; Refill: 0  2. COVID-19 virus infection Cough and above symptoms started 5-6 days ago. Had a positive home COVID test on 07-12-20. Maintain isolation at home and treat with Mucinex-DM and prednisone taper. Recheck prn. - predniSONE (DELTASONE) 5 MG tablet; Taper down by 1 tablet by mouth daily starting at 6 day 1, then, 5 day 2, 4 day 3, 3 day 4, 2 day 5 and 1 day 6. Divide dosage among meals and bedtime daily.  Dispense: 21 tablet; Refill: 0   No follow-ups on file.     I discussed the assessment and treatment plan with the patient. The patient was provided an opportunity to ask questions and all were answered. The patient agreed with the plan and demonstrated an understanding of the instructions.   The patient was advised to call back or seek an in-person evaluation if the symptoms worsen or if the condition fails to improve as anticipated.  I provided 20 minutes of non-face-to-face time during this encounter.  I, Cormac Wint, PA-C, have reviewed all documentation for this visit. The  documentation on 07/14/20 for the exam, diagnosis, procedures, and orders are all accurate and complete.   Dortha Kern, PA-C [employer] [phone] (phone) [phone] (fax)  [facility]

## 2020-07-27 ENCOUNTER — Encounter: Payer: Self-pay | Admitting: Family Medicine

## 2020-07-31 ENCOUNTER — Encounter: Payer: Self-pay | Admitting: Urology

## 2020-08-04 ENCOUNTER — Encounter: Payer: Self-pay | Admitting: Dermatology

## 2020-08-04 ENCOUNTER — Other Ambulatory Visit: Payer: Self-pay

## 2020-08-04 ENCOUNTER — Ambulatory Visit (INDEPENDENT_AMBULATORY_CARE_PROVIDER_SITE_OTHER): Payer: PRIVATE HEALTH INSURANCE | Admitting: Dermatology

## 2020-08-04 DIAGNOSIS — L821 Other seborrheic keratosis: Secondary | ICD-10-CM

## 2020-08-04 DIAGNOSIS — Z1283 Encounter for screening for malignant neoplasm of skin: Secondary | ICD-10-CM | POA: Diagnosis not present

## 2020-08-04 DIAGNOSIS — L738 Other specified follicular disorders: Secondary | ICD-10-CM | POA: Diagnosis not present

## 2020-08-04 DIAGNOSIS — L578 Other skin changes due to chronic exposure to nonionizing radiation: Secondary | ICD-10-CM

## 2020-08-04 DIAGNOSIS — L820 Inflamed seborrheic keratosis: Secondary | ICD-10-CM

## 2020-08-04 DIAGNOSIS — D229 Melanocytic nevi, unspecified: Secondary | ICD-10-CM

## 2020-08-04 DIAGNOSIS — Z86018 Personal history of other benign neoplasm: Secondary | ICD-10-CM | POA: Diagnosis not present

## 2020-08-04 DIAGNOSIS — Z85828 Personal history of other malignant neoplasm of skin: Secondary | ICD-10-CM | POA: Diagnosis not present

## 2020-08-04 DIAGNOSIS — D1800 Hemangioma unspecified site: Secondary | ICD-10-CM

## 2020-08-04 DIAGNOSIS — L814 Other melanin hyperpigmentation: Secondary | ICD-10-CM

## 2020-08-04 NOTE — Progress Notes (Unsigned)
Follow-Up Visit   Subjective  Anthony Graves is a 60 y.o. male who presents for the following: Mole check > 1 yr (Total body skin exam, hx of BCC, Dysplastic nevi). The patient presents for Total-Body Skin Exam (TBSE) for skin cancer screening and mole check.  The following portions of the chart were reviewed this encounter and updated as appropriate:   Tobacco  Allergies  Meds  Problems  Med Hx  Surg Hx  Fam Hx     Review of Systems:  No other skin or systemic complaints except as noted in HPI or Assessment and Plan.  Objective  Well appearing patient in no apparent distress; mood and affect are within normal limits.  A full examination was performed including scalp, head, eyes, ears, nose, lips, neck, chest, axillae, abdomen, back, buttocks, bilateral upper extremities, bilateral lower extremities, hands, feet, fingers, toes, fingernails, and toenails. All findings within normal limits unless otherwise noted below.  Objective  multiple: Well healed scars with no evidence of recurrence.   Objective  multiple: Scars with no evidence of recurrence.   Objective  face: Yellow lobulated paps  Objective  Left temple within hairline x 4, R post scalp x 1, R scalp x 3 (8): Erythematous keratotic or waxy stuck-on papule or plaque.    Assessment & Plan    Lentigines - Scattered tan macules - Discussed due to sun exposure - Benign, observe - Call for any changes  Seborrheic Keratoses - Stuck-on, waxy, tan-brown papules and plaques  - Discussed benign etiology and prognosis. - Observe - Call for any changes  Melanocytic Nevi - Tan-brown and/or pink-flesh-colored symmetric macules and papules - Benign appearing on exam today - Observation - Call clinic for new or changing moles - Recommend daily use of broad spectrum spf 30+ sunscreen to sun-exposed areas.   Hemangiomas - Red papules - Discussed benign nature - Observe - Call for any changes  Actinic Damage -  Chronic, secondary to cumulative UV/sun exposure - diffuse scaly erythematous macules with underlying dyspigmentation - Recommend daily broad spectrum sunscreen SPF 30+ to sun-exposed areas, reapply every 2 hours as needed.  - Call for new or changing lesions.  Skin cancer screening performed today.  History of basal cell carcinoma (BCC) multiple  Clear. Observe for recurrence. Call clinic for new or changing lesions.  Recommend regular skin exams, daily broad-spectrum spf 30+ sunscreen use, and photoprotection.     History of dysplastic nevus multiple  Clear. Observe for recurrence. Call clinic for new or changing lesions.  Recommend regular skin exams, daily broad-spectrum spf 30+ sunscreen use, and photoprotection.     Sebaceous hyperplasia face  Benign appearing, observe  Inflamed seborrheic keratosis (8) Left temple within hairline x 4, R post scalp x 1, R scalp x 3  Destruction of lesion - Left temple within hairline x 4, R post scalp x 1, R scalp x 3 Complexity: simple   Destruction method: cryotherapy   Informed consent: discussed and consent obtained   Timeout:  patient name, date of birth, surgical site, and procedure verified Lesion destroyed using liquid nitrogen: Yes   Region frozen until ice ball extended beyond lesion: Yes   Outcome: patient tolerated procedure well with no complications   Post-procedure details: wound care instructions given    Skin cancer screening  Return in about 1 year (around 08/04/2021) for TBSE, Hx of BCC, Hx of Dysplastic nevi.  I, Othelia Pulling, RMA, am acting as scribe for Sarina Ser, MD .  Documentation: I have reviewed the above documentation for accuracy and completeness, and I agree with the above.  Sarina Ser, MD

## 2020-08-05 ENCOUNTER — Encounter: Payer: Self-pay | Admitting: Dermatology

## 2020-09-28 ENCOUNTER — Other Ambulatory Visit: Payer: Self-pay | Admitting: Internal Medicine

## 2020-09-28 ENCOUNTER — Other Ambulatory Visit: Payer: Self-pay | Admitting: Urology

## 2020-10-26 ENCOUNTER — Encounter: Payer: Self-pay | Admitting: Internal Medicine

## 2020-12-03 ENCOUNTER — Other Ambulatory Visit: Payer: Self-pay

## 2020-12-03 ENCOUNTER — Ambulatory Visit (INDEPENDENT_AMBULATORY_CARE_PROVIDER_SITE_OTHER): Payer: PRIVATE HEALTH INSURANCE | Admitting: Family Medicine

## 2020-12-03 ENCOUNTER — Encounter: Payer: Self-pay | Admitting: Family Medicine

## 2020-12-03 VITALS — BP 136/75 | HR 85 | Temp 98.6°F | Resp 16 | Ht 70.0 in | Wt 167.0 lb

## 2020-12-03 DIAGNOSIS — D473 Essential (hemorrhagic) thrombocythemia: Secondary | ICD-10-CM

## 2020-12-03 DIAGNOSIS — M109 Gout, unspecified: Secondary | ICD-10-CM

## 2020-12-03 MED ORDER — PREDNISONE 10 MG (21) PO TBPK: ORAL_TABLET | ORAL | 0 refills | Status: DC

## 2020-12-03 NOTE — Progress Notes (Signed)
Established patient visit   Patient: Anthony Graves   DOB: 04-May-1961   60 y.o. Male  MRN: [id number] Visit Date: 12/03/2020  Today's healthcare provider: Wilhemena Durie, MD   Chief Complaint  Patient presents with  . possible Gout   Subjective    HPI  Patient presents today c/o pain in left great toe. He was seen at [facility] on 11/22/20 and was treated for gout. He was treated with colchicine 0.6mg  and prednisone taper. Labs were also drawn for uric acid levels and it was WNL.  The prednisone taper seems to be the thing that helps.      Medications: Outpatient Medications Prior to Visit  Medication Sig  . anagrelide (AGRYLIN) 0.5 MG capsule TAKE 2 CAPSULES EVERY MORNING AND TAKE 1CAPSULE EVERY EVENING  . aspirin EC 81 MG tablet Take 1 tablet by mouth daily.  . tamsulosin (FLOMAX) 0.4 MG CAPS capsule TAKE 1 CAPSULE BY MOUTH ONCE DAILY  . azithromycin (ZITHROMAX) 250 MG tablet Take 2 tablets by mouth today, then 1 daily for 4 days.  . predniSONE (DELTASONE) 5 MG tablet Taper down by 1 tablet by mouth daily starting at 6 day 1, then, 5 day 2, 4 day 3, 3 day 4, 2 day 5 and 1 day 6. Divide dosage among meals and bedtime daily. (Patient not taking: Reported on 12/03/2020)   No facility-administered medications prior to visit.    Review of Systems  Constitutional: Negative for activity change and fatigue.  Musculoskeletal: Positive for arthralgias and joint swelling. Negative for myalgias.  Skin: Negative for color change, pallor and rash.        Objective    BP 136/75   Pulse 85   Temp 98.6 F (37 C)   Resp 16   Ht 5\' 10"  (1.778 m)   Wt 167 lb (75.8 kg)   BMI 23.96 kg/m  Wt Readings from Last 3 Encounters:  12/03/20 167 lb (75.8 kg)  06/24/20 168 lb 12.8 oz (76.6 kg)  04/29/20 170 lb (77.1 kg)       Physical Exam Vitals reviewed.  HENT:     Head: Normocephalic and atraumatic.  Eyes:     General: No scleral icterus.    Conjunctiva/sclera: Conjunctivae  normal.  Cardiovascular:     Rate and Rhythm: Normal rate and regular rhythm.     Heart sounds: Normal heart sounds.  Pulmonary:     Breath sounds: Normal breath sounds.  Abdominal:     Palpations: Abdomen is soft.  Musculoskeletal:     Comments: typical presentation of gout in the left MTP joint of the great toe  Skin:    General: Skin is warm and dry.  Neurological:     General: No focal deficit present.     Mental Status: He is alert and oriented to person, place, and time.  Psychiatric:        Mood and Affect: Mood normal.        Behavior: Behavior normal.        Thought Content: Thought content normal.        Judgment: Judgment normal.       No results found for any visits on 12/03/20.  Assessment & Plan     1. Gout involving toe, unspecified cause, unspecified chronicity, unspecified laterality Discussed gout and consider checking uric acid going forward.  Right now we will treat with a prednisone taper consider colchicine on a as needed basis.  No allopurinol presently.  Patient in agreement. - predniSONE (STERAPRED UNI-PAK 21 TAB) 10 MG (21) TBPK tablet; Taper as directed.  Dispense: 21 tablet; Refill: 0  2. Essential thrombocytosis ([place]) Followed by hematology.   No follow-ups on file.      I, Wilhemena Durie, MD, have reviewed all documentation for this visit. The documentation on 12/12/20 for the exam, diagnosis, procedures, and orders are all accurate and complete.    Omega Durante Cranford Mon, MD  [facility] [phone] (phone) [phone] (fax)  [place]

## 2020-12-15 ENCOUNTER — Other Ambulatory Visit: Payer: Self-pay | Admitting: Family Medicine

## 2020-12-15 DIAGNOSIS — M109 Gout, unspecified: Secondary | ICD-10-CM

## 2020-12-23 ENCOUNTER — Ambulatory Visit: Payer: BC Managed Care – PPO | Admitting: Internal Medicine

## 2020-12-23 ENCOUNTER — Other Ambulatory Visit: Payer: BC Managed Care – PPO

## 2020-12-28 ENCOUNTER — Telehealth: Payer: Self-pay | Admitting: Family Medicine

## 2020-12-28 DIAGNOSIS — M109 Gout, unspecified: Secondary | ICD-10-CM

## 2020-12-28 MED ORDER — COLCHICINE 0.6 MG PO CAPS: 1.0000 mg | ORAL_CAPSULE | Freq: Every day | ORAL | 0 refills | Status: DC

## 2020-12-28 MED ORDER — PREDNISONE 10 MG (21) PO TBPK: ORAL_TABLET | ORAL | 0 refills | Status: DC

## 2020-12-28 NOTE — Telephone Encounter (Signed)
Patient was treated with a prednisone taper when he was seen in the office on 5/19. Any other recommendations? Please advise. Thanks!

## 2020-12-28 NOTE — Telephone Encounter (Signed)
Medication was sent into the pharmacy. Patient was advised. Order for referral placed.

## 2020-12-28 NOTE — Telephone Encounter (Signed)
Pt called and stated he seen Dr. Rosanna Randy on 5.19.22 for the issue with Gout but he still has not been able to get it cleared up or all the way under control and wanted Dr. Rosanna Randy or nurse to call him with advice on what he may need to do/ gout is in left foot / please advise asap

## 2020-12-30 ENCOUNTER — Inpatient Hospital Stay: Payer: 59 | Attending: Internal Medicine

## 2020-12-30 ENCOUNTER — Other Ambulatory Visit: Payer: Self-pay

## 2020-12-30 ENCOUNTER — Encounter: Payer: Self-pay | Admitting: Internal Medicine

## 2020-12-30 ENCOUNTER — Inpatient Hospital Stay (HOSPITAL_BASED_OUTPATIENT_CLINIC_OR_DEPARTMENT_OTHER): Payer: 59 | Admitting: Internal Medicine

## 2020-12-30 VITALS — BP 125/72 | HR 68 | Temp 97.6°F | Resp 20 | Wt 171.0 lb

## 2020-12-30 DIAGNOSIS — D72829 Elevated white blood cell count, unspecified: Secondary | ICD-10-CM | POA: Diagnosis not present

## 2020-12-30 DIAGNOSIS — R972 Elevated prostate specific antigen [PSA]: Secondary | ICD-10-CM | POA: Insufficient documentation

## 2020-12-30 DIAGNOSIS — D473 Essential (hemorrhagic) thrombocythemia: Secondary | ICD-10-CM

## 2020-12-30 DIAGNOSIS — M109 Gout, unspecified: Secondary | ICD-10-CM | POA: Insufficient documentation

## 2020-12-30 DIAGNOSIS — D72828 Other elevated white blood cell count: Secondary | ICD-10-CM

## 2020-12-30 LAB — COMPREHENSIVE METABOLIC PANEL
ALT: 21 U/L (ref 0–44)
AST: 28 U/L (ref 15–41)
Albumin: 4.1 g/dL (ref 3.5–5.0)
Alkaline Phosphatase: 46 U/L (ref 38–126)
Anion gap: 9 (ref 5–15)
BUN: 26 mg/dL — ABNORMAL HIGH (ref 6–20)
CO2: 27 mmol/L (ref 22–32)
Calcium: 9.2 mg/dL (ref 8.9–10.3)
Chloride: 103 mmol/L (ref 98–111)
Creatinine, Ser: 1.05 mg/dL (ref 0.61–1.24)
GFR, Estimated: >60 mL/min (ref >60)
Glucose, Bld: 107 mg/dL — ABNORMAL HIGH (ref 70–99)
Potassium: 5 mmol/L (ref 3.5–5.1)
Sodium: 139 mmol/L (ref 135–145)
Total Bilirubin: 0.5 mg/dL (ref 0.3–1.2)
Total Protein: 7 g/dL (ref 6.5–8.1)

## 2020-12-30 LAB — CBC WITH DIFFERENTIAL/PLATELET
Abs Immature Granulocytes: 1.52 10*3/uL — ABNORMAL HIGH (ref 0.00–0.07)
Basophils Absolute: 0.2 10*3/uL — ABNORMAL HIGH (ref 0.0–0.1)
Basophils Relative: 1 %
Eosinophils Absolute: 0 10*3/uL (ref 0.0–0.5)
Eosinophils Relative: 0 %
HCT: 41.4 % (ref 39.0–52.0)
Hemoglobin: 13.4 g/dL (ref 13.0–17.0)
Immature Granulocytes: 5 %
Lymphocytes Relative: 9 %
Lymphs Abs: 2.6 10*3/uL (ref 0.7–4.0)
MCH: 31.5 pg (ref 26.0–34.0)
MCHC: 32.4 g/dL (ref 30.0–36.0)
MCV: 97.4 fL (ref 80.0–100.0)
Monocytes Absolute: 2.1 10*3/uL — ABNORMAL HIGH (ref 0.1–1.0)
Monocytes Relative: 7 %
Neutro Abs: 22.5 10*3/uL — ABNORMAL HIGH (ref 1.7–7.7)
Neutrophils Relative %: 78 %
Platelets: 457 10*3/uL — ABNORMAL HIGH (ref 150–400)
RBC: 4.25 MIL/uL (ref 4.22–5.81)
RDW: 15.1 % (ref 11.5–15.5)
Smear Review: ADEQUATE
WBC: 28.9 10*3/uL — ABNORMAL HIGH (ref 4.0–10.5)
nRBC: 0.1 % (ref 0.0–0.2)

## 2020-12-30 LAB — PATHOLOGIST SMEAR REVIEW

## 2020-12-30 LAB — LACTATE DEHYDROGENASE: LDH: 373 U/L — ABNORMAL HIGH (ref 98–192)

## 2020-12-30 NOTE — Assessment & Plan Note (Addendum)
#   Essential thrombocytosis-LOW risk; [positive for CALR mutation] no complications noted.  Asymptomatic.  Platelets today 453 [on pred/]; on aspirin/anagrelide 1.5 mg once day.  Stable; however leukocytosis-see below.  # Mild Leucocytosis-[March 2021- peripheral blood flow cytometry-1% marrow blasts; without any phenotypic aberrancy. Also LDH elevated at 357] .  However today-White count 28,000-likely secondary to prednisone.  However LDH slightly elevated at 373.  Monitor closely.  # Acute gout- left big toe- on prednisone/colchine.   # Elevated PSA ~ [Dr.Stoiff]- MRI/[facility].   # DISPOSITION: will call  # labs in 3 months- cbc/cmp;LDH # Follow-up in 6 months; MD; labs-CBC CMP/LDH- Dr.B  Addendum: I reviewed the findings with patient; as patient is on prednisone/obviously exacerbated leukocytosis; however blasts unusual.  Recommend repeat blood work/flow in 1 month.  Path Review Blood smear is reviewed.   Comment: There is mild thrombocytosis. RBCs are unremarkable. Neutrophilic leukocytosis is present, with occasional blasts, fewer than 5 percent. A few myelocytes are also noted. There is neutrophil nuclear hypersegmentation.     Cc:   Dr. Rosanna Randy.

## 2020-12-30 NOTE — Progress Notes (Signed)
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  Patient recently diagnosed with gout of the metatarsal.  Patient is on prednisone/colchicine. Symptoms are improving.  Finishing prednisone in the next few days.  Denies any fevers or chills.  Denies any weight loss.  Denies any nausea vomiting.  No night sweats.  Review of Systems  Constitutional:  Negative for chills, diaphoresis, fever, malaise/fatigue and weight loss.  HENT:  Negative for nosebleeds and sore throat.   Eyes:  Negative for double vision.  Respiratory:  Negative for cough, hemoptysis, sputum production, shortness of breath and wheezing.   Cardiovascular:  Negative for chest pain, palpitations, orthopnea and leg swelling.  Gastrointestinal:  Negative for abdominal pain, blood in stool, constipation, diarrhea, heartburn, melena, nausea and vomiting.  Genitourinary:  Negative for dysuria, frequency and urgency.  Musculoskeletal:  Negative for back pain and joint pain.  Skin: Negative.  Negative for itching and rash.  Neurological:  Negative for dizziness, tingling, focal  weakness, weakness and headaches.  Endo/Heme/Allergies:  Does not bruise/bleed easily.  Psychiatric/Behavioral:  Negative for depression. The patient is not nervous/anxious and does not have insomnia.   PAST MEDICAL HISTORY :  Past Medical History:  Diagnosis Date   Arthritis    osteoartiritis   Basal cell carcinoma 10/12/2009   Left post. shoulder sup. lateral scapula, 3cm med. to hemangioma.    Basal cell carcinoma 02/26/2015   Right temple. Nodular.   Basal cell carcinoma 03/21/2019   Right inf. med. pectoral. Nodular. EDC   Basal cell carcinoma 03/21/2019   Left lat. abdomen parallel lat. to umbilicus. Micronodular. EDC.   Dysplastic nevus 01/28/2008   Left upper back medial sup scapula. Moderate to severe atypia, margins involved. Excised 03/04/2008, persistent DN, close to margin.   Dysplastic nevus 07/01/2010   Left low back paraspinal lat. Mild atypiua, margins involved.    Dysplastic nevus 07/01/2010   Left low back paraspinal me. Mild atypia, margins involved.    Gout    High platelet count     PAST SURGICAL HISTORY :   Past Surgical History:  Procedure Laterality Date   COLONOSCOPY  2016   CYSTECTOMY  years ago   pilonidal cyst removed   KNEE SURGERY Right 20 years ago   arthrocscopy   TOTAL KNEE ARTHROPLASTY Right 08/15/2016   Procedure: RIGHT TOTAL KNEE ARTHROPLASTY;  Surgeon: Gaynelle Arabian, MD;  Location: [facility];  Service: Orthopedics;  Laterality: Right;    FAMILY HISTORY :  History reviewed. No pertinent family history.  SOCIAL HISTORY:   Social History   Tobacco Use   Smoking status: Never   Smokeless tobacco: Never  Substance Use Topics   Alcohol use: Yes    Alcohol/week: 3.0 - 7.0 standard drinks  Types: 3 - 7 Glasses of wine per week   Drug use: No    ALLERGIES:  is allergic to penicillins.  MEDICATIONS:  Current Outpatient Medications  Medication Sig Dispense Refill   anagrelide (AGRYLIN) 0.5 MG capsule TAKE 2 CAPSULES EVERY MORNING AND  TAKE 1CAPSULE EVERY EVENING 270 capsule 1   aspirin EC 81 MG tablet Take 1 tablet by mouth daily.     Colchicine 0.6 MG CAPS Take 1 mg by mouth daily. 30 capsule 0   predniSONE (STERAPRED UNI-PAK 21 TAB) 10 MG (21) TBPK tablet Taper as directed. 21 tablet 0   tamsulosin (FLOMAX) 0.4 MG CAPS capsule TAKE 1 CAPSULE BY MOUTH ONCE DAILY 30 capsule 3   No current facility-administered medications for this visit.    PHYSICAL EXAMINATION: ECOG PERFORMANCE STATUS: 0 - Asymptomatic  BP 125/72   Pulse 68   Temp 97.6 F (36.4 C) (Tympanic)   Resp 20   Wt 171 lb (77.6 kg)   BMI 24.54 kg/m   Filed Weights   12/30/20 1007  Weight: 171 lb (77.6 kg)    Physical Exam HENT:     Head: Normocephalic and atraumatic.     Mouth/Throat:     Pharynx: No oropharyngeal exudate.  Eyes:     Pupils: Pupils are equal, round, and reactive to light.  Cardiovascular:     Rate and Rhythm: Normal rate and regular rhythm.  Pulmonary:     Effort: No respiratory distress.     Breath sounds: Normal breath sounds. No wheezing.  Abdominal:     General: Bowel sounds are normal. There is no distension.     Palpations: Abdomen is soft. There is no mass.     Tenderness: no abdominal tenderness There is no guarding or rebound.  Musculoskeletal:        General: No tenderness. Normal range of motion.     Cervical back: Normal range of motion and neck supple.  Skin:    General: Skin is warm.  Neurological:     Mental Status: He is alert and oriented to person, place, and time.  Psychiatric:        Mood and Affect: Affect normal.    LABORATORY DATA:  I have reviewed the data as listed    Component Value Date/Time   NA 139 12/30/2020 0946   NA 143 07/29/2016 0844   K 5.0 12/30/2020 0946   CL 103 12/30/2020 0946   CO2 27 12/30/2020 0946   GLUCOSE 107 (H) 12/30/2020 0946   BUN 26 (H) 12/30/2020 0946   BUN 20 07/29/2016 0844   CREATININE 1.05 12/30/2020 0946   CREATININE 1.10 04/25/2014 0823   CALCIUM  9.2 12/30/2020 0946   PROT 7.0 12/30/2020 0946   PROT 6.7 07/29/2016 0844   PROT 6.7 04/25/2014 0823   ALBUMIN 4.1 12/30/2020 0946   ALBUMIN 4.5 07/29/2016 0844   ALBUMIN 3.8 04/25/2014 0823   AST 28 12/30/2020 0946   AST 22 04/25/2014 0823   ALT 21 12/30/2020 0946   ALT 26 04/25/2014 0823   ALKPHOS 46 12/30/2020 0946   ALKPHOS 44 (L) 04/25/2014 0823   BILITOT 0.5 12/30/2020 0946   BILITOT 0.6 07/29/2016 0844   BILITOT 0.6 04/25/2014 0823   GFRNONAA >60 12/30/2020 0946   GFRNONAA >60 04/25/2014 0823   GFRNONAA >60 02/04/2014 0830   GFRAA >60 12/25/2019 1324   GFRAA >60 04/25/2014 0823   GFRAA >60 02/04/2014 0830    No results found for: SPEP, UPEP  Lab  Results  Component Value Date   WBC 28.9 (H) 12/30/2020   NEUTROABS 22.5 (H) 12/30/2020   HGB 13.4 12/30/2020   HCT 41.4 12/30/2020   MCV 97.4 12/30/2020   PLT 457 (H) 12/30/2020      Chemistry      Component Value Date/Time   NA 139 12/30/2020 0946   NA 143 07/29/2016 0844   K 5.0 12/30/2020 0946   CL 103 12/30/2020 0946   CO2 27 12/30/2020 0946   BUN 26 (H) 12/30/2020 0946   BUN 20 07/29/2016 0844   CREATININE 1.05 12/30/2020 0946   CREATININE 1.10 04/25/2014 0823   GLU 89 01/01/2014 0000      Component Value Date/Time   CALCIUM 9.2 12/30/2020 0946   ALKPHOS 46 12/30/2020 0946   ALKPHOS 44 (L) 04/25/2014 0823   AST 28 12/30/2020 0946   AST 22 04/25/2014 0823   ALT 21 12/30/2020 0946   ALT 26 04/25/2014 0823   BILITOT 0.5 12/30/2020 0946   BILITOT 0.6 07/29/2016 0844   BILITOT 0.6 04/25/2014 0823         ASSESSMENT & PLAN:   Essential thrombocytosis ([place]) # Essential thrombocytosis-LOW risk; [positive for CALR mutation] no complications noted.  Asymptomatic.  Platelets today 453 [on pred/]; on aspirin/anagrelide 1.5 mg once day.  Stable; however leukocytosis-see below.  # Mild Leucocytosis-[March 2021- peripheral blood flow cytometry-1% marrow blasts; without any phenotypic aberrancy. Also LDH  elevated at 357] .  However today-White count 28,000-likely secondary to prednisone.  However LDH slightly elevated at 373.  Monitor closely.  # Acute gout- left big toe- on prednisone/colchine.   # Elevated PSA ~ [Dr.Stoiff]- MRI/[facility].   # DISPOSITION: will call  # labs in 3 months- cbc/cmp;LDH # Follow-up in 6 months; MD; labs-CBC CMP/LDH- Dr.B  Addendum: I reviewed the findings with patient; as patient is on prednisone/obviously exacerbated leukocytosis; however blasts unusual.  Recommend repeat blood work/flow in 1 month.  Path Review Blood smear is reviewed.   Comment: There is mild thrombocytosis. RBCs are unremarkable. Neutrophilic leukocytosis is present, with occasional blasts, fewer than 5 percent. A few myelocytes are also noted. There is neutrophil nuclear hypersegmentation.     Cc:   Dr. Rosanna Randy.     Cammie Sickle, MD 01/10/2021 12:00 AM

## 2021-01-01 ENCOUNTER — Telehealth: Payer: Self-pay | Admitting: Internal Medicine

## 2021-01-01 DIAGNOSIS — D473 Essential (hemorrhagic) thrombocythemia: Secondary | ICD-10-CM

## 2021-01-01 LAB — COMP PANEL: LEUKEMIA/LYMPHOMA: Immunophenotypic Profile: 1

## 2021-01-01 NOTE — Telephone Encounter (Signed)
On 6/17-I spoke to patient regarding the results of the peripheral smear/flow cytometry positive for 1% blasts.  Patient on prednisone for the next 2 days/gout Recommend repeating labs in 1 months. Dicsussed that he might need a bone marrow biopsy also.   C-please schedule lab in 1 month-labs cbc/LDH-; review of smear by pathologist-  GB

## 2021-01-04 NOTE — Addendum Note (Signed)
Addended by: Gloris Ham on: 01/04/2021 08:56 AM   Modules accepted: Orders

## 2021-01-04 NOTE — Telephone Encounter (Signed)
Labs - dated and added path review to future labs

## 2021-01-27 ENCOUNTER — Other Ambulatory Visit: Payer: Self-pay

## 2021-01-27 ENCOUNTER — Inpatient Hospital Stay: Payer: 59 | Attending: Internal Medicine

## 2021-01-27 DIAGNOSIS — D72828 Other elevated white blood cell count: Secondary | ICD-10-CM

## 2021-01-27 DIAGNOSIS — D473 Essential (hemorrhagic) thrombocythemia: Secondary | ICD-10-CM

## 2021-01-27 LAB — CBC WITH DIFFERENTIAL/PLATELET
Abs Immature Granulocytes: 0.49 10*3/uL — ABNORMAL HIGH (ref 0.00–0.07)
Basophils Absolute: 0.1 10*3/uL (ref 0.0–0.1)
Basophils Relative: 1 %
Eosinophils Absolute: 0 10*3/uL (ref 0.0–0.5)
Eosinophils Relative: 0 %
HCT: 41.2 % (ref 39.0–52.0)
Hemoglobin: 13 g/dL (ref 13.0–17.0)
Immature Granulocytes: 4 %
Lymphocytes Relative: 16 %
Lymphs Abs: 2.3 10*3/uL (ref 0.7–4.0)
MCH: 31.3 pg (ref 26.0–34.0)
MCHC: 31.6 g/dL (ref 30.0–36.0)
MCV: 99 fL (ref 80.0–100.0)
Monocytes Absolute: 1.3 10*3/uL — ABNORMAL HIGH (ref 0.1–1.0)
Monocytes Relative: 10 %
Neutro Abs: 9.6 10*3/uL — ABNORMAL HIGH (ref 1.7–7.7)
Neutrophils Relative %: 69 %
Platelets: 378 10*3/uL (ref 150–400)
RBC: 4.16 MIL/uL — ABNORMAL LOW (ref 4.22–5.81)
RDW: 14.9 % (ref 11.5–15.5)
Smear Review: ADEQUATE
WBC: 13.8 10*3/uL — ABNORMAL HIGH (ref 4.0–10.5)
nRBC: 0.2 % (ref 0.0–0.2)

## 2021-01-27 LAB — COMPREHENSIVE METABOLIC PANEL
ALT: 18 U/L (ref 0–44)
AST: 25 U/L (ref 15–41)
Albumin: 4.2 g/dL (ref 3.5–5.0)
Alkaline Phosphatase: 46 U/L (ref 38–126)
Anion gap: 9 (ref 5–15)
BUN: 22 mg/dL — ABNORMAL HIGH (ref 6–20)
CO2: 28 mmol/L (ref 22–32)
Calcium: 9.1 mg/dL (ref 8.9–10.3)
Chloride: 101 mmol/L (ref 98–111)
Creatinine, Ser: 0.94 mg/dL (ref 0.61–1.24)
GFR, Estimated: >60 mL/min (ref >60)
Glucose, Bld: 130 mg/dL — ABNORMAL HIGH (ref 70–99)
Potassium: 5.1 mmol/L (ref 3.5–5.1)
Sodium: 138 mmol/L (ref 135–145)
Total Bilirubin: 0.7 mg/dL (ref 0.3–1.2)
Total Protein: 6.7 g/dL (ref 6.5–8.1)

## 2021-01-27 LAB — LACTATE DEHYDROGENASE: LDH: 326 U/L — ABNORMAL HIGH (ref 98–192)

## 2021-01-27 LAB — PATHOLOGIST SMEAR REVIEW

## 2021-01-29 ENCOUNTER — Telehealth: Payer: Self-pay | Admitting: Internal Medicine

## 2021-01-29 LAB — COMP PANEL: LEUKEMIA/LYMPHOMA: Immunophenotypic Profile: 2

## 2021-01-29 NOTE — Telephone Encounter (Signed)
On 7/15-I spoke to patient regarding the results of the flow cytometry that shows 2% myeloblasts/immature cells.  Long discussion regarding pros and cons of bone marrow biopsy; also given the option of continued surveillance of peripheral blood.  Also discussed the bone marrow biopsy procedure/radiology/sedation tec.   patient leaning towards bone marrow biopsy; however wants to discuss with his wife.  Heather- please let me know when pt calls back; will put in the order. GB

## 2021-02-01 NOTE — Telephone Encounter (Signed)
[place]

## 2021-02-03 ENCOUNTER — Other Ambulatory Visit: Payer: Self-pay

## 2021-02-03 ENCOUNTER — Encounter: Payer: Self-pay | Admitting: Urology

## 2021-02-03 ENCOUNTER — Ambulatory Visit (INDEPENDENT_AMBULATORY_CARE_PROVIDER_SITE_OTHER): Payer: PRIVATE HEALTH INSURANCE | Admitting: Urology

## 2021-02-03 VITALS — BP 137/75 | HR 73 | Ht 70.0 in | Wt 170.0 lb

## 2021-02-03 DIAGNOSIS — R972 Elevated prostate specific antigen [PSA]: Secondary | ICD-10-CM

## 2021-02-03 DIAGNOSIS — N401 Enlarged prostate with lower urinary tract symptoms: Secondary | ICD-10-CM | POA: Diagnosis not present

## 2021-02-03 MED ORDER — TAMSULOSIN HCL 0.4 MG PO CAPS: 0.4000 mg | ORAL_CAPSULE | Freq: Two times a day (BID) | ORAL | 0 refills | Status: DC

## 2021-02-03 NOTE — Progress Notes (Signed)
02/03/2021 5:40 PM   Anthony Graves 1961/01/12 [id number]  Referring provider: Jerrol Banana., MD [address] [place],  [place] [address]  Chief Complaint  Patient presents with   Elevated PSA    HPI: 60 y.o. male presents for follow-up of an elevated PSA.  Initially seen 04/29/2020 for PSA of 6.5 Moderate LUTS Benign DRE Started on tamsulosin Urinalysis showed 6-10 WBCs with culture growing 3000 colonies group B beta-hemolytic strep and was also additionally treated with amoxicillin Follow-up PSA November 2021 remained elevated at 6.5 Prostate biopsy, MRI and surveillance were discussed He had a telephone encounter with Dr. Lottie Rater at [facility] and elected MRI which was performed at [facility] 07/29/2020; prostate volume ~ at 38 cc; no lesions suspicious for high-grade cancer identified; PI-RADS 2 Voiding symptoms improved on tamsulosin which he has continued he has occasional urgency and nocturia x2 which he states is not that bothersome He states he has a follow-up appoint with Dr. Lottie Rater later this month   PMH: Past Medical History:  Diagnosis Date   Arthritis    osteoartiritis   Basal cell carcinoma 10/12/2009   Left post. shoulder sup. lateral scapula, 3cm med. to hemangioma.    Basal cell carcinoma 02/26/2015   Right temple. Nodular.   Basal cell carcinoma 03/21/2019   Right inf. med. pectoral. Nodular. EDC   Basal cell carcinoma 03/21/2019   Left lat. abdomen parallel lat. to umbilicus. Micronodular. EDC.   Dysplastic nevus 01/28/2008   Left upper back medial sup scapula. Moderate to severe atypia, margins involved. Excised 03/04/2008, persistent DN, close to margin.   Dysplastic nevus 07/01/2010   Left low back paraspinal lat. Mild atypiua, margins involved.    Dysplastic nevus 07/01/2010   Left low back paraspinal me. Mild atypia, margins involved.    Gout    High platelet count     Surgical History: Past Surgical History:  Procedure Laterality  Date   COLONOSCOPY  2016   CYSTECTOMY  years ago   pilonidal cyst removed   KNEE SURGERY Right 20 years ago   arthrocscopy   TOTAL KNEE ARTHROPLASTY Right 08/15/2016   Procedure: RIGHT TOTAL KNEE ARTHROPLASTY;  Surgeon: Gaynelle Arabian, MD;  Location: [facility];  Service: Orthopedics;  Laterality: Right;    Home Medications:  Allergies as of 02/03/2021       Reactions   Penicillins    Reaction as a child Has patient had a PCN reaction causing immediate rash, facial/tongue/throat swelling, SOB or lightheadedness with hypotension:unsure Has patient had a PCN reaction causing severe rash involving mucus membranes or skin necrosis:unsure Has patient had a PCN reaction that required hospitalization:No Has patient had a PCN reaction occurring within the last 10 years: No If all of the above answers are "NO", then may proceed with Cephalosporin use.        Medication List        Accurate as of February 03, 2021  5:40 PM. If you have any questions, ask your nurse or doctor.          STOP taking these medications    predniSONE 10 MG (21) Tbpk tablet Commonly known as: STERAPRED UNI-PAK 21 TAB Stopped by: Abbie Sons, MD       TAKE these medications    anagrelide 0.5 MG capsule Commonly known as: AGRYLIN TAKE 2 CAPSULES EVERY MORNING AND TAKE 1CAPSULE EVERY EVENING   aspirin EC 81 MG tablet Take 1 tablet by mouth daily.   Colchicine 0.6  MG Caps Take 1 mg by mouth daily.   tamsulosin 0.4 MG Caps capsule Commonly known as: FLOMAX Take 1 capsule (0.4 mg total) by mouth in the morning and at bedtime. What changed: when to take this Changed by: Abbie Sons, MD        Allergies:  Allergies  Allergen Reactions   Penicillins     Reaction as a child Has patient had a PCN reaction causing immediate rash, facial/tongue/throat swelling, SOB or lightheadedness with hypotension:unsure Has patient had a PCN reaction causing severe rash involving mucus membranes or skin  necrosis:unsure Has patient had a PCN reaction that required hospitalization:No Has patient had a PCN reaction occurring within the last 10 years: No If all of the above answers are "NO", then may proceed with Cephalosporin use.     Family History: No family history on file.  Social History:  reports that he has never smoked. He has never used smokeless tobacco. He reports current alcohol use of about 3.0 - 7.0 standard drinks of alcohol per week. He reports that he does not use drugs.   Physical Exam: BP 137/75   Pulse 73   Ht 5\' 10"  (1.778 m)   Wt 170 lb (77.1 kg)   BMI 24.39 kg/m   Constitutional:  Alert and oriented, No acute distress. HEENT: [place] AT, moist mucus membranes.  Trachea midline, no masses. Cardiovascular: No clubbing, cyanosis, or edema. Respiratory: Normal respiratory effort, no increased work of breathing. GU: DRE last visit benign and deferred today with upcoming appointment with Dr. Lottie Rater Psychiatric: Normal mood and affect.   Assessment & Plan:    1. Elevated PSA Prostate MRI in January 2022 without suspicious lesions Repeat PSA today Appointment scheduled with Dr. L[facility]ie Rater at [facility] later this month  2.  BPH with LUTS Symptoms improved on tamsulosin He would like to titrate to 0.8 mg and will give a 30-day trial   Abbie Sons, MD  [place] [address], [place], [place] [address] [phone]

## 2021-02-04 LAB — PSA: Prostate Specific Ag, Serum: 7.2 ng/mL — ABNORMAL HIGH (ref 0.0–4.0)

## 2021-02-05 ENCOUNTER — Encounter: Payer: Self-pay | Admitting: *Deleted

## 2021-02-24 ENCOUNTER — Other Ambulatory Visit: Payer: Self-pay | Admitting: *Deleted

## 2021-02-24 ENCOUNTER — Telehealth: Payer: Self-pay

## 2021-02-24 DIAGNOSIS — M109 Gout, unspecified: Secondary | ICD-10-CM

## 2021-02-24 NOTE — Telephone Encounter (Signed)
Copied from [place] [id number]. Topic: General - Other >> Feb 24, 2021  8:56 AM Tessa Lerner A wrote: Reason for CRM: Patient has gotten in touch with [facility] and been directed to contact their PCP and ask for Referral [phone] to be resubmitted   The patient waited longer than Jefm Bryant prefers to respond to them and the referral now needs to be sent again   Please contact further if needed

## 2021-02-24 NOTE — Progress Notes (Signed)
Reentered referral.

## 2021-02-24 NOTE — Telephone Encounter (Signed)
Reentered referral.

## 2021-03-09 ENCOUNTER — Other Ambulatory Visit: Payer: Self-pay | Admitting: Urology

## 2021-03-19 ENCOUNTER — Encounter: Payer: Self-pay | Admitting: Internal Medicine

## 2021-03-23 ENCOUNTER — Encounter: Payer: Self-pay | Admitting: Internal Medicine

## 2021-03-23 NOTE — Progress Notes (Signed)
Spoke to patient on September 6 regarding the upcoming prostate biopsy. Hold aspirin and anagrelide one week prior to biopsy. Start an accolade three days post biopsy. Start aspirin one week post biopsy.

## 2021-03-31 ENCOUNTER — Other Ambulatory Visit: Payer: 59

## 2021-04-05 ENCOUNTER — Encounter: Payer: Self-pay | Admitting: Internal Medicine

## 2021-04-07 ENCOUNTER — Inpatient Hospital Stay: Payer: 59

## 2021-04-13 ENCOUNTER — Other Ambulatory Visit: Payer: Self-pay | Admitting: Urology

## 2021-04-13 NOTE — Progress Notes (Signed)
Complete physical exam   Patient: Anthony Graves   DOB: Feb 09, 1961   60 y.o. Male  MRN: [id number] Visit Date: 04/14/2021  Today's healthcare provider: Wilhemena Durie, MD   Chief Complaint  Patient presents with   Annual Exam   Subjective    Anthony Graves is a 60 y.o. male who presents today for a complete physical exam.  He reports consuming a  diet. Home exercise routine includes walking. He generally feels well. He regeneralports sleeping well. He does not have additional problems to discuss today.  Pt now has f/u scheduled for prostate cancer treatment options.  Past Medical History:  Diagnosis Date   Arthritis    osteoartiritis   Basal cell carcinoma 10/12/2009   Left post. shoulder sup. lateral scapula, 3cm med. to hemangioma.    Basal cell carcinoma 02/26/2015   Right temple. Nodular.   Basal cell carcinoma 03/21/2019   Right inf. med. pectoral. Nodular. EDC   Basal cell carcinoma 03/21/2019   Left lat. abdomen parallel lat. to umbilicus. Micronodular. EDC.   Dysplastic nevus 01/28/2008   Left upper back medial sup scapula. Moderate to severe atypia, margins involved. Excised 03/04/2008, persistent DN, close to margin.   Dysplastic nevus 07/01/2010   Left low back paraspinal lat. Mild atypiua, margins involved.    Dysplastic nevus 07/01/2010   Left low back paraspinal me. Mild atypia, margins involved.    Gout    High platelet count    Past Surgical History:  Procedure Laterality Date   COLONOSCOPY  2016   CYSTECTOMY  years ago   pilonidal cyst removed   KNEE SURGERY Right 20 years ago   arthrocscopy   TOTAL KNEE ARTHROPLASTY Right 08/15/2016   Procedure: RIGHT TOTAL KNEE ARTHROPLASTY;  Surgeon: Gaynelle Arabian, MD;  Location: [facility];  Service: Orthopedics;  Laterality: Right;   Social History   Socioeconomic History   Marital status: Married    Spouse name: Not on file   Number of children: Not on file   Years of education: Not on file   Highest  education level: Not on file  Occupational History   Not on file  Tobacco Use   Smoking status: Never   Smokeless tobacco: Never  Substance and Sexual Activity   Alcohol use: Yes    Alcohol/week: 3.0 - 7.0 standard drinks    Types: 3 - 7 Glasses of wine per week   Drug use: No   Sexual activity: Not on file  Other Topics Concern   Not on file  Social History Narrative   Not on file   Social Determinants of Health   Financial Resource Strain: Not on file  Food Insecurity: Not on file  Transportation Needs: Not on file  Physical Activity: Not on file  Stress: Not on file  Social Connections: Not on file  Intimate Partner Violence: Not on file   Family Status  Relation Name Status   Mother  Alive   Father  Alive   Sister  Alive   No family history on file. Allergies  Allergen Reactions   Penicillins     Reaction as a child Has patient had a PCN reaction causing immediate rash, facial/tongue/throat swelling, SOB or lightheadedness with hypotension:unsure Has patient had a PCN reaction causing severe rash involving mucus membranes or skin necrosis:unsure Has patient had a PCN reaction that required hospitalization:No Has patient had a PCN reaction occurring within the last 10 years: No If all of the above  answers are "NO", then may proceed with Cephalosporin use.     Patient Care Team: Jerrol Banana., MD as PCP - General (Family Medicine) Cammie Sickle, MD as Consulting Physician (Hematology and Oncology)   Medications: Outpatient Medications Prior to Visit  Medication Sig   allopurinol (ZYLOPRIM) 100 MG tablet Take 100 mg by mouth daily.   anagrelide (AGRYLIN) 0.5 MG capsule TAKE 2 CAPSULES EVERY MORNING AND TAKE 1CAPSULE EVERY EVENING   aspirin EC 81 MG tablet Take 1 tablet by mouth daily.   tamsulosin (FLOMAX) 0.4 MG CAPS capsule TAKE 1 CAPSULE BY MOUTH ONCE DAILY   Colchicine 0.6 MG CAPS Take 1 mg by mouth daily.   No facility-administered  medications prior to visit.    Review of Systems  All other systems reviewed and are negative.     Objective    BP 126/61   Pulse 74   Temp 99 F (37.2 C)   Resp 16   Ht 5\' 10"  (1.778 m)   Wt 168 lb (76.2 kg)   BMI 24.11 kg/m  BP Readings from Last 3 Encounters:  04/14/21 126/61  02/03/21 137/75  12/30/20 125/72   Wt Readings from Last 3 Encounters:  04/14/21 168 lb (76.2 kg)  02/03/21 170 lb (77.1 kg)  12/30/20 171 lb (77.6 kg)      Physical Exam Vitals reviewed.  Constitutional:      Appearance: Normal appearance. He is normal weight.  HENT:     Head: Normocephalic and atraumatic.     Right Ear: Tympanic membrane, ear canal and external ear normal.     Left Ear: Tympanic membrane, ear canal and external ear normal.     Nose: Nose normal.     Mouth/Throat:     Mouth: Mucous membranes are moist.     Pharynx: Oropharynx is clear.  Eyes:     Extraocular Movements: Extraocular movements intact.     Conjunctiva/sclera: Conjunctivae normal.     Pupils: Pupils are equal, round, and reactive to light.  Cardiovascular:     Rate and Rhythm: Normal rate and regular rhythm.     Pulses: Normal pulses.     Heart sounds: Normal heart sounds.  Pulmonary:     Effort: Pulmonary effort is normal.     Breath sounds: Normal breath sounds.  Abdominal:     General: Abdomen is flat. Bowel sounds are normal.     Palpations: Abdomen is soft.  Genitourinary:    Penis: Normal.      Testes: Normal.     Prostate: Normal.     Rectum: Normal. Guaiac result negative.  Musculoskeletal:        General: Normal range of motion.     Cervical back: Normal range of motion and neck supple.     Comments: Well healed total right knee arthroplasty. Good ROM without pain.   Skin:    General: Skin is warm and dry.  Neurological:     General: No focal deficit present.     Mental Status: He is alert and oriented to person, place, and time. Mental status is at baseline.  Psychiatric:         Mood and Affect: Mood normal.        Behavior: Behavior normal.        Thought Content: Thought content normal.        Judgment: Judgment normal.      Last depression screening scores PHQ 2/9 Scores 04/14/2021 04/07/2020  PHQ - 2  Score 0 0  PHQ- 9 Score - 0   Last fall risk screening Fall Risk  04/14/2021  Falls in the past year? 0  Number falls in past yr: 0  Injury with Fall? 0  Risk for fall due to : No Fall Risks  Follow up Falls evaluation completed   Last Audit-C alcohol use screening Alcohol Use Disorder Test (AUDIT) 04/14/2021  1. How often do you have a drink containing alcohol? 4  2. How many drinks containing alcohol do you have on a typical day when you are drinking? 0  3. How often do you have six or more drinks on one occasion? 1  AUDIT-C Score 5  4. How often during the last year have you found that you were not able to stop drinking once you had started? 0  5. How often during the last year have you failed to do what was normally expected from you because of drinking? 0  6. How often during the last year have you needed a first drink in the morning to get yourself going after a heavy drinking session? 0  7. How often during the last year have you had a feeling of guilt of remorse after drinking? 0  8. How often during the last year have you been unable to remember what happened the night before because you had been drinking? 0  9. Have you or someone else been injured as a result of your drinking? 0  10. Has a relative or friend or a doctor or another health worker been concerned about your drinking or suggested you cut down? 0  Alcohol Use Disorder Identification Test Final Score (AUDIT) 5  Alcohol Brief Interventions/Follow-up -   A score of 3 or more in women, and 4 or more in men indicates increased risk for alcohol abuse, EXCEPT if all of the points are from question 1   No results found for any visits on 04/14/21.  Assessment & Plan    Routine Health  Maintenance and Physical Exam  Exercise Activities and Dietary recommendations  Goals   None     Immunization History  Administered Date(s) Administered   Influenza,inj,Quad PF,6+ Mos 04/07/2020   Influenza-Unspecified 05/27/2019   PFIZER(Purple Top)SARS-COV-2 Vaccination 10/14/2019, 11/11/2019   Tdap 05/22/2006    Health Maintenance  Topic Date Due   Zoster Vaccines- Shingrix (1 of 2) Never done   COLONOSCOPY (Pts 45-56yrs Insurance coverage will need to be confirmed)  Never done   TETANUS/TDAP  05/22/2016   COVID-19 Vaccine (3 - Pfizer risk series) 12/09/2019   INFLUENZA VACCINE  02/15/2021   Hepatitis C Screening  Completed   HIV Screening  Completed   HPV VACCINES  Aged Out    Discussed health benefits of physical activity, and encouraged him to engage in regular exercise appropriate for his age and condition.  1. Annual physical exam   2. 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 Elevated LDL cholesterol level  - Lipid panel - TSH - Hemoglobin A1c  4. Gout involving toe, unspecified cause, unspecified chronicity, unspecified laterality Followed by rheumatology.  Patient now on allopurinol. - Uric acid  5. Need for Tdap vaccination  - Tdap vaccine greater than or equal to 7yo IM  6. Need for influenza vaccination  - Flu Vaccine QUAD 6+ mos PF IM (Fluarix Quad PF)  7. Prostate cancer [facility]) Plan pending per urology.   No follow-ups on file.     I, Wilhemena Durie, MD, have reviewed  all documentation for this visit. The documentation on 04/16/21 for the exam, diagnosis, procedures, and orders are all accurate and complete.    Leenah Seidner Cranford Mon, MD  [facility] [phone] (phone) [phone] (fax)  [place]

## 2021-04-14 ENCOUNTER — Ambulatory Visit (INDEPENDENT_AMBULATORY_CARE_PROVIDER_SITE_OTHER): Payer: PRIVATE HEALTH INSURANCE | Admitting: Family Medicine

## 2021-04-14 ENCOUNTER — Other Ambulatory Visit: Payer: Self-pay

## 2021-04-14 ENCOUNTER — Encounter: Payer: Self-pay | Admitting: Family Medicine

## 2021-04-14 VITALS — BP 126/61 | HR 74 | Temp 99.0°F | Resp 16 | Ht 70.0 in | Wt 168.0 lb

## 2021-04-14 DIAGNOSIS — E7800 Pure hypercholesterolemia, unspecified: Secondary | ICD-10-CM | POA: Diagnosis not present

## 2021-04-14 DIAGNOSIS — M109 Gout, unspecified: Secondary | ICD-10-CM

## 2021-04-14 DIAGNOSIS — Z23 Encounter for immunization: Secondary | ICD-10-CM | POA: Diagnosis not present

## 2021-04-14 DIAGNOSIS — D473 Essential (hemorrhagic) thrombocythemia: Secondary | ICD-10-CM

## 2021-04-14 DIAGNOSIS — Z0000 Encounter for general adult medical examination without abnormal findings: Secondary | ICD-10-CM | POA: Diagnosis not present

## 2021-04-14 DIAGNOSIS — C61 Malignant neoplasm of prostate: Secondary | ICD-10-CM | POA: Insufficient documentation

## 2021-04-15 ENCOUNTER — Other Ambulatory Visit: Payer: Self-pay | Admitting: Internal Medicine

## 2021-04-15 ENCOUNTER — Encounter: Payer: Self-pay | Admitting: Urology

## 2021-04-20 ENCOUNTER — Other Ambulatory Visit: Payer: Self-pay

## 2021-04-20 DIAGNOSIS — D72828 Other elevated white blood cell count: Secondary | ICD-10-CM

## 2021-04-20 DIAGNOSIS — D473 Essential (hemorrhagic) thrombocythemia: Secondary | ICD-10-CM

## 2021-04-21 ENCOUNTER — Inpatient Hospital Stay: Payer: 59 | Attending: Internal Medicine

## 2021-04-21 ENCOUNTER — Other Ambulatory Visit: Payer: Self-pay

## 2021-04-21 ENCOUNTER — Other Ambulatory Visit: Payer: 59

## 2021-04-21 DIAGNOSIS — D473 Essential (hemorrhagic) thrombocythemia: Secondary | ICD-10-CM | POA: Insufficient documentation

## 2021-04-21 DIAGNOSIS — D72828 Other elevated white blood cell count: Secondary | ICD-10-CM

## 2021-04-21 LAB — CBC WITH DIFFERENTIAL/PLATELET
Abs Immature Granulocytes: 0.42 10*3/uL — ABNORMAL HIGH (ref 0.00–0.07)
Basophils Absolute: 0.1 10*3/uL (ref 0.0–0.1)
Basophils Relative: 1 %
Eosinophils Absolute: 0 10*3/uL (ref 0.0–0.5)
Eosinophils Relative: 0 %
HCT: 39.1 % (ref 39.0–52.0)
Hemoglobin: 12.3 g/dL — ABNORMAL LOW (ref 13.0–17.0)
Immature Granulocytes: 4 %
Lymphocytes Relative: 19 %
Lymphs Abs: 2.1 10*3/uL (ref 0.7–4.0)
MCH: 31.1 pg (ref 26.0–34.0)
MCHC: 31.5 g/dL (ref 30.0–36.0)
MCV: 99 fL (ref 80.0–100.0)
Monocytes Absolute: 1.3 10*3/uL — ABNORMAL HIGH (ref 0.1–1.0)
Monocytes Relative: 12 %
Neutro Abs: 7.1 10*3/uL (ref 1.7–7.7)
Neutrophils Relative %: 64 %
Platelets: 298 10*3/uL (ref 150–400)
RBC: 3.95 MIL/uL — ABNORMAL LOW (ref 4.22–5.81)
RDW: 14.5 % (ref 11.5–15.5)
WBC: 11 10*3/uL — ABNORMAL HIGH (ref 4.0–10.5)
nRBC: 0 % (ref 0.0–0.2)

## 2021-04-21 LAB — COMPREHENSIVE METABOLIC PANEL
ALT: 16 U/L (ref 0–44)
AST: 24 U/L (ref 15–41)
Albumin: 4 g/dL (ref 3.5–5.0)
Alkaline Phosphatase: 46 U/L (ref 38–126)
Anion gap: 7 (ref 5–15)
BUN: 19 mg/dL (ref 6–20)
CO2: 29 mmol/L (ref 22–32)
Calcium: 9 mg/dL (ref 8.9–10.3)
Chloride: 102 mmol/L (ref 98–111)
Creatinine, Ser: 0.95 mg/dL (ref 0.61–1.24)
GFR, Estimated: >60 mL/min (ref >60)
Glucose, Bld: 93 mg/dL (ref 70–99)
Potassium: 4.5 mmol/L (ref 3.5–5.1)
Sodium: 138 mmol/L (ref 135–145)
Total Bilirubin: 0.5 mg/dL (ref 0.3–1.2)
Total Protein: 6.5 g/dL (ref 6.5–8.1)

## 2021-04-21 LAB — LACTATE DEHYDROGENASE: LDH: 323 U/L — ABNORMAL HIGH (ref 98–192)

## 2021-04-23 ENCOUNTER — Other Ambulatory Visit: Payer: 59

## 2021-05-05 ENCOUNTER — Encounter: Payer: Self-pay | Admitting: Internal Medicine

## 2021-05-05 NOTE — Progress Notes (Signed)
On October 19 I left a voicemail for the patient with regards to his recent blood work overall stabile/ improved. I would recommend follow up as planned.

## 2021-05-06 ENCOUNTER — Other Ambulatory Visit: Payer: Self-pay | Admitting: Urology

## 2021-06-03 ENCOUNTER — Encounter: Payer: Self-pay | Admitting: Urology

## 2021-06-07 ENCOUNTER — Other Ambulatory Visit: Payer: Self-pay | Admitting: Urology

## 2021-06-07 MED ORDER — TAMSULOSIN HCL 0.4 MG PO CAPS: 0.4000 mg | ORAL_CAPSULE | Freq: Every day | ORAL | 3 refills | Status: DC

## 2021-06-30 ENCOUNTER — Other Ambulatory Visit: Payer: 59

## 2021-06-30 ENCOUNTER — Ambulatory Visit: Payer: 59 | Admitting: Internal Medicine

## 2021-07-14 ENCOUNTER — Telehealth: Payer: Self-pay

## 2021-07-14 ENCOUNTER — Encounter: Payer: Self-pay | Admitting: Internal Medicine

## 2021-07-14 ENCOUNTER — Other Ambulatory Visit: Payer: Self-pay

## 2021-07-14 ENCOUNTER — Inpatient Hospital Stay (HOSPITAL_BASED_OUTPATIENT_CLINIC_OR_DEPARTMENT_OTHER): Payer: 59 | Admitting: Internal Medicine

## 2021-07-14 ENCOUNTER — Inpatient Hospital Stay: Payer: 59 | Attending: Internal Medicine

## 2021-07-14 VITALS — BP 141/69 | HR 61 | Temp 97.4°F | Ht 70.0 in | Wt 174.9 lb

## 2021-07-14 DIAGNOSIS — D473 Essential (hemorrhagic) thrombocythemia: Secondary | ICD-10-CM | POA: Diagnosis present

## 2021-07-14 DIAGNOSIS — Z7982 Long term (current) use of aspirin: Secondary | ICD-10-CM | POA: Insufficient documentation

## 2021-07-14 DIAGNOSIS — M109 Gout, unspecified: Secondary | ICD-10-CM | POA: Diagnosis not present

## 2021-07-14 DIAGNOSIS — Z79899 Other long term (current) drug therapy: Secondary | ICD-10-CM | POA: Diagnosis not present

## 2021-07-14 LAB — CBC WITH DIFFERENTIAL/PLATELET
Abs Immature Granulocytes: 0.63 10*3/uL — ABNORMAL HIGH (ref 0.00–0.07)
Basophils Absolute: 0.1 10*3/uL (ref 0.0–0.1)
Basophils Relative: 1 %
Eosinophils Absolute: 0 10*3/uL (ref 0.0–0.5)
Eosinophils Relative: 0 %
HCT: 36 % — ABNORMAL LOW (ref 39.0–52.0)
Hemoglobin: 11.6 g/dL — ABNORMAL LOW (ref 13.0–17.0)
Immature Granulocytes: 6 %
Lymphocytes Relative: 17 %
Lymphs Abs: 1.9 10*3/uL (ref 0.7–4.0)
MCH: 31.8 pg (ref 26.0–34.0)
MCHC: 32.2 g/dL (ref 30.0–36.0)
MCV: 98.6 fL (ref 80.0–100.0)
Monocytes Absolute: 1.3 10*3/uL — ABNORMAL HIGH (ref 0.1–1.0)
Monocytes Relative: 12 %
Neutro Abs: 7.5 10*3/uL (ref 1.7–7.7)
Neutrophils Relative %: 64 %
Platelets: 369 10*3/uL (ref 150–400)
RBC: 3.65 MIL/uL — ABNORMAL LOW (ref 4.22–5.81)
RDW: 15 % (ref 11.5–15.5)
Smear Review: ADEQUATE
WBC: 11.5 10*3/uL — ABNORMAL HIGH (ref 4.0–10.5)
nRBC: 0.3 % — ABNORMAL HIGH (ref 0.0–0.2)

## 2021-07-14 LAB — COMPREHENSIVE METABOLIC PANEL
ALT: 20 U/L (ref 0–44)
AST: 27 U/L (ref 15–41)
Albumin: 3.9 g/dL (ref 3.5–5.0)
Alkaline Phosphatase: 47 U/L (ref 38–126)
Anion gap: 8 (ref 5–15)
BUN: 23 mg/dL (ref 8–23)
CO2: 31 mmol/L (ref 22–32)
Calcium: 8.9 mg/dL (ref 8.9–10.3)
Chloride: 103 mmol/L (ref 98–111)
Creatinine, Ser: 1.09 mg/dL (ref 0.61–1.24)
GFR, Estimated: >60 mL/min (ref >60)
Glucose, Bld: 97 mg/dL (ref 70–99)
Potassium: 4.2 mmol/L (ref 3.5–5.1)
Sodium: 142 mmol/L (ref 135–145)
Total Bilirubin: 0.5 mg/dL (ref 0.3–1.2)
Total Protein: 6.5 g/dL (ref 6.5–8.1)

## 2021-07-14 LAB — LACTATE DEHYDROGENASE: LDH: 377 U/L — ABNORMAL HIGH (ref 98–192)

## 2021-07-14 NOTE — Assessment & Plan Note (Signed)
#  Essential thrombocytosis-LOW risk; [positive for CALR mutation]; however hemoglobin is 11; white count 11.5-most recently noted to have few blasts on the peripheral blood flow cytometry.  Recommend further evaluation with a bone marrow biopsy to rule out-complications like secondary myelofibrosis or acute leukemia.  Interestingly patient continues to be asymptomatic.  Continue aspirin/anagrelide.  #History of gout- left big toe-allopurinol stable.  # Elevated PSA/prostate cancer ~ [Dr.Stoiff]- MRI/[facility]- currently on surveillance.   # DISPOSITION:  # Bone marrow biopsy- in 1-2 weeks.  # Follow-up in 1 months; MD; labs-CBC CMP/LDH- Dr.B   Path Review Blood smear is reviewed.   Comment: There is mild thrombocytosis. RBCs are unremarkable. Neutrophilic leukocytosis is present, with occasional blasts, fewer than 5 percent. A few myelocytes are also noted. There is neutrophil nuclear hypersegmentation.     Cc:   Dr. Rosanna Randy.

## 2021-07-14 NOTE — Telephone Encounter (Signed)
Sch. Request for bone marrow biopsy faxed with order.

## 2021-07-14 NOTE — Progress Notes (Signed)
[facility] OFFICE PROGRESS NOTE  Patient Care Team: Jerrol Banana., MD as PCP - General (Family Medicine) Cammie Sickle, MD as Consulting Physician (Hematology and Oncology)   SUMMARY OF HEMATOLOGIC/ONCOLOGIC HISTORY: Oncology History Overview Note  # 2005- Essential thrombocythemia [Bone marrow study on December 24, 2003 showed increased enlarged megakaryocytes, cellularity 50%, consistent with ET [Dr.Pandit]; Cytogenetics, flow study nd BCR/ABL study negative.January 2007: [id number] mutation negative] On Anagrelide therapy Asa 2m/d; CALR MUTATION POSITIVE [Nov 2017];   DIAGNOSIS: ET  STAGE: low risk        ;GOALS: control  CURRENT/MOST RECENT THERAPY : Anagrelid    Essential thrombocytosis ([place]   INTERVAL HISTORY:  A very pleasant 60-year-old male patient with above history of essential thrombocytosis currently on aspirin/anagrelide is here for follow-up.  Patient denies any unusual shortness of breath or cough.  Denies any fatigue.  No weight loss.  He is currently on allopurinol for his gout.  Review of Systems  Constitutional:  Negative for chills, diaphoresis, fever, malaise/fatigue and weight loss.  HENT:  Negative for nosebleeds and sore throat.   Eyes:  Negative for double vision.  Respiratory:  Negative for cough, hemoptysis, sputum production, shortness of breath and wheezing.   Cardiovascular:  Negative for chest pain, palpitations, orthopnea and leg swelling.  Gastrointestinal:  Negative for abdominal pain, blood in stool, constipation, diarrhea, heartburn, melena, nausea and vomiting.  Genitourinary:  Negative for dysuria, frequency and urgency.  Musculoskeletal:  Negative for back pain and joint pain.  Skin: Negative.  Negative for itching and rash.  Neurological:  Negative for dizziness, tingling, focal weakness, weakness and headaches.  Endo/Heme/Allergies:  Does not bruise/bleed easily.  Psychiatric/Behavioral:  Negative for  depression. The patient is not nervous/anxious and does not have insomnia.   PAST MEDICAL HISTORY :  Past Medical History:  Diagnosis Date   Arthritis    osteoartiritis   Basal cell carcinoma 10/12/2009   Left post. shoulder sup. lateral scapula, 3cm med. to hemangioma.    Basal cell carcinoma 02/26/2015   Right temple. Nodular.   Basal cell carcinoma 03/21/2019   Right inf. med. pectoral. Nodular. EDC   Basal cell carcinoma 03/21/2019   Left lat. abdomen parallel lat. to umbilicus. Micronodular. EDC.   Dysplastic nevus 01/28/2008   Left upper back medial sup scapula. Moderate to severe atypia, margins involved. Excised 03/04/2008, persistent DN, close to margin.   Dysplastic nevus 07/01/2010   Left low back paraspinal lat. Mild atypiua, margins involved.    Dysplastic nevus 07/01/2010   Left low back paraspinal me. Mild atypia, margins involved.    Gout    High platelet count     PAST SURGICAL HISTORY :   Past Surgical History:  Procedure Laterality Date   COLONOSCOPY  2016   CYSTECTOMY  years ago   pilonidal cyst removed   KNEE SURGERY Right 20 years ago   arthrocscopy   TOTAL KNEE ARTHROPLASTY Right 08/15/2016   Procedure: RIGHT TOTAL KNEE ARTHROPLASTY;  Surgeon: FGaynelle Arabian MD;  Location: [facility];  Service: Orthopedics;  Laterality: Right;    FAMILY HISTORY :  History reviewed. No pertinent family history.  SOCIAL HISTORY:   Social History   Tobacco Use   Smoking status: Never   Smokeless tobacco: Never  Vaping Use   Vaping Use: Never used  Substance Use Topics   Alcohol use: Yes    Alcohol/week: 3.0 - 7.0 standard drinks    Types: 3 - 7 Glasses of  wine per week   Drug use: No    ALLERGIES:  is allergic to penicillins.  MEDICATIONS:  Current Outpatient Medications  Medication Sig Dispense Refill   allopurinol (ZYLOPRIM) 100 MG tablet Take 300 mg by mouth daily.     anagrelide (AGRYLIN) 0.5 MG capsule TAKE 2 CAPSULES EVERY MORNING AND TAKE 1CAPSULE EVERY  EVENING 270 capsule 1   aspirin EC 81 MG tablet Take 1 tablet by mouth daily.     tamsulosin (FLOMAX) 0.4 MG CAPS capsule Take 1 capsule (0.4 mg total) by mouth daily. 90 capsule 3   No current facility-administered medications for this visit.    PHYSICAL EXAMINATION: ECOG PERFORMANCE STATUS: 0 - Asymptomatic  BP (!) 141/69 (BP Location: Right Arm, Patient Position: Sitting, Cuff Size: Normal)    Pulse 61    Temp (!) 97.4 F (36.3 C) (Tympanic)    Ht '5\' 10"'  (1.778 m)    Wt 174 lb 14.4 oz (79.3 kg)    SpO2 100%    BMI 25.10 kg/m   Filed Weights   07/14/21 1521  Weight: 174 lb 14.4 oz (79.3 kg)    Physical Exam HENT:     Head: Normocephalic and atraumatic.     Mouth/Throat:     Pharynx: No oropharyngeal exudate.  Eyes:     Pupils: Pupils are equal, round, and reactive to light.  Cardiovascular:     Rate and Rhythm: Normal rate and regular rhythm.  Pulmonary:     Effort: No respiratory distress.     Breath sounds: Normal breath sounds. No wheezing.  Abdominal:     General: Bowel sounds are normal. There is no distension.     Palpations: Abdomen is soft. There is no mass.     Tenderness: There is no abdominal tenderness. There is no guarding or rebound.  Musculoskeletal:        General: No tenderness. Normal range of motion.     Cervical back: Normal range of motion and neck supple.  Skin:    General: Skin is warm.  Neurological:     Mental Status: He is alert and oriented to person, place, and time.  Psychiatric:        Mood and Affect: Affect normal.    LABORATORY DATA:  I have reviewed the data as listed    Component Value Date/Time   NA 142 07/14/2021 1458   NA 143 07/29/2016 0844   K 4.2 07/14/2021 1458   CL 103 07/14/2021 1458   CO2 31 07/14/2021 1458   GLUCOSE 97 07/14/2021 1458   BUN 23 07/14/2021 1458   BUN 20 07/29/2016 0844   CREATININE 1.09 07/14/2021 1458   CREATININE 1.10 04/25/2014 0823   CALCIUM 8.9 07/14/2021 1458   PROT 6.5 07/14/2021 1458    PROT 6.7 07/29/2016 0844   PROT 6.7 04/25/2014 0823   ALBUMIN 3.9 07/14/2021 1458   ALBUMIN 4.5 07/29/2016 0844   ALBUMIN 3.8 04/25/2014 0823   AST 27 07/14/2021 1458   AST 22 04/25/2014 0823   ALT 20 07/14/2021 1458   ALT 26 04/25/2014 0823   ALKPHOS 47 07/14/2021 1458   ALKPHOS 44 (L) 04/25/2014 0823   BILITOT 0.5 07/14/2021 1458   BILITOT 0.6 07/29/2016 0844   BILITOT 0.6 04/25/2014 0823   GFRNONAA >60 07/14/2021 1458   GFRNONAA >60 04/25/2014 0823   GFRNONAA >60 02/04/2014 0830   GFRAA >60 12/25/2019 1324   GFRAA >60 04/25/2014 0823   GFRAA >60 02/04/2014 0830    No  results found for: SPEP, UPEP  Lab Results  Component Value Date   WBC 11.5 (H) 07/14/2021   NEUTROABS PENDING 07/14/2021   HGB 11.6 (L) 07/14/2021   HCT 36.0 (L) 07/14/2021   MCV 98.6 07/14/2021   PLT 369 07/14/2021      Chemistry      Component Value Date/Time   NA 142 07/14/2021 1458   NA 143 07/29/2016 0844   K 4.2 07/14/2021 1458   CL 103 07/14/2021 1458   CO2 31 07/14/2021 1458   BUN 23 07/14/2021 1458   BUN 20 07/29/2016 0844   CREATININE 1.09 07/14/2021 1458   CREATININE 1.10 04/25/2014 0823   GLU 89 01/01/2014 0000      Component Value Date/Time   CALCIUM 8.9 07/14/2021 1458   ALKPHOS 47 07/14/2021 1458   ALKPHOS 44 (L) 04/25/2014 0823   AST 27 07/14/2021 1458   AST 22 04/25/2014 0823   ALT 20 07/14/2021 1458   ALT 26 04/25/2014 0823   BILITOT 0.5 07/14/2021 1458   BILITOT 0.6 07/29/2016 0844   BILITOT 0.6 04/25/2014 0823         ASSESSMENT & PLAN:   Essential thrombocytosis ([place]) # Essential thrombocytosis-LOW risk; [positive for CALR mutation]; however hemoglobin is 11; white count 11.5-most recently noted to have few blasts on the peripheral blood flow cytometry.  Recommend further evaluation with a bone marrow biopsy to rule out-complications like secondary myelofibrosis or acute leukemia.  Interestingly patient continues to be asymptomatic.  Continue  aspirin/anagrelide.  #History of gout- left big toe-allopurinol stable.  # Elevated PSA/prostate cancer ~ [Dr.Stoiff]- MRI/[facility]- currently on surveillance.   # DISPOSITION:  # Bone marrow biopsy- in 1-2 weeks.  # Follow-up in 1 months; MD; labs-CBC CMP/LDH- Dr.B   Path Review Blood smear is reviewed.   Comment: There is mild thrombocytosis. RBCs are unremarkable. Neutrophilic leukocytosis is present, with occasional blasts, fewer than 5 percent. A few myelocytes are also noted. There is neutrophil nuclear hypersegmentation.     Cc:   Dr. Rosanna Randy.     Cammie Sickle, MD 07/14/2021 4:02 PM

## 2021-07-15 NOTE — Telephone Encounter (Signed)
BMB 07/28/2021 @9am  with 8am arrival. Left voice mail and a mychart message with appt date and time with our ph #.

## 2021-07-21 ENCOUNTER — Encounter: Payer: Self-pay | Admitting: Internal Medicine

## 2021-07-28 ENCOUNTER — Ambulatory Visit: Admission: RE | Admit: 2021-07-28 | Payer: 59 | Source: Ambulatory Visit

## 2021-08-04 ENCOUNTER — Other Ambulatory Visit: Payer: Self-pay

## 2021-08-04 ENCOUNTER — Encounter: Payer: Self-pay | Admitting: Dermatology

## 2021-08-04 ENCOUNTER — Ambulatory Visit (INDEPENDENT_AMBULATORY_CARE_PROVIDER_SITE_OTHER): Payer: 59 | Admitting: Dermatology

## 2021-08-04 DIAGNOSIS — D1800 Hemangioma unspecified site: Secondary | ICD-10-CM

## 2021-08-04 DIAGNOSIS — Z85828 Personal history of other malignant neoplasm of skin: Secondary | ICD-10-CM

## 2021-08-04 DIAGNOSIS — L814 Other melanin hyperpigmentation: Secondary | ICD-10-CM

## 2021-08-04 DIAGNOSIS — I781 Nevus, non-neoplastic: Secondary | ICD-10-CM

## 2021-08-04 DIAGNOSIS — L578 Other skin changes due to chronic exposure to nonionizing radiation: Secondary | ICD-10-CM

## 2021-08-04 DIAGNOSIS — L719 Rosacea, unspecified: Secondary | ICD-10-CM

## 2021-08-04 DIAGNOSIS — L821 Other seborrheic keratosis: Secondary | ICD-10-CM

## 2021-08-04 DIAGNOSIS — L738 Other specified follicular disorders: Secondary | ICD-10-CM

## 2021-08-04 DIAGNOSIS — D229 Melanocytic nevi, unspecified: Secondary | ICD-10-CM

## 2021-08-04 DIAGNOSIS — Z86018 Personal history of other benign neoplasm: Secondary | ICD-10-CM

## 2021-08-04 DIAGNOSIS — L820 Inflamed seborrheic keratosis: Secondary | ICD-10-CM

## 2021-08-04 DIAGNOSIS — Z1283 Encounter for screening for malignant neoplasm of skin: Secondary | ICD-10-CM

## 2021-08-04 NOTE — Progress Notes (Signed)
Follow-Up Visit   Subjective  Anthony Graves is a 61 y.o. male who presents for the following: Total body skin exam (Hx of BCCs, Hx of Dysplastic Nevi) and check spots (R shoulder, R side, hx of being red and crusty but have improved). The patient presents for Total-Body Skin Exam (TBSE) for skin cancer screening and mole check.  The patient has spots, moles and lesions to be evaluated, some may be new or changing and the patient has concerns that these could be cancer.  The following portions of the chart were reviewed this encounter and updated as appropriate:   Tobacco   Allergies   Meds   Problems   Med Hx   Surg Hx   Fam Hx      Review of Systems:  No other skin or systemic complaints except as noted in HPI or Assessment and Plan.  Objective  Well appearing patient in no apparent distress; mood and affect are within normal limits.  A full examination was performed including scalp, head, eyes, ears, nose, lips, neck, chest, axillae, abdomen, back, buttocks, bilateral upper extremities, bilateral lower extremities, hands, feet, fingers, toes, fingernails, and toenails. All findings within normal limits unless otherwise noted below.  R clavile x 1, ant base of neck x 2, R side x 1, L arm x 1 (5) Stuck on waxy paps with erythema   R nose Blanching pink macule 0.5 x 0.3cm     Head - Anterior (Face) Face clear today   Assessment & Plan   Lentigines - Scattered tan macules - Due to sun exposure - Benign-appearing, observe - Recommend daily broad spectrum sunscreen SPF 30+ to sun-exposed areas, reapply every 2 hours as needed. - Call for any changes  Seborrheic Keratoses - Stuck-on, waxy, tan-brown papules and/or plaques  - Benign-appearing - Discussed benign etiology and prognosis. - Observe - Call for any changes  Melanocytic Nevi - Tan-brown and/or pink-flesh-colored symmetric macules and papules - Benign appearing on exam today - Observation - Call clinic for new  or changing moles - Recommend daily use of broad spectrum spf 30+ sunscreen to sun-exposed areas.   Hemangiomas - Red papules - Discussed benign nature - Observe - Call for any changes  Actinic Damage - Chronic condition, secondary to cumulative UV/sun exposure - diffuse scaly erythematous macules with underlying dyspigmentation - Recommend daily broad spectrum sunscreen SPF 30+ to sun-exposed areas, reapply every 2 hours as needed.  - Staying in the shade or wearing long sleeves, sun glasses (UVA+UVB protection) and wide brim hats (4-inch brim around the entire circumference of the hat) are also recommended for sun protection.  - Call for new or changing lesions.  Skin cancer screening performed today.  History of Basal Cell Carcinoma of the Skin - No evidence of recurrence today - Recommend regular full body skin exams - Recommend daily broad spectrum sunscreen SPF 30+ to sun-exposed areas, reapply every 2 hours as needed.  - Call if any new or changing lesions are noted between office visits  - multiple  History of Dysplastic Nevi - No evidence of recurrence today - Recommend regular full body skin exams - Recommend daily broad spectrum sunscreen SPF 30+ to sun-exposed areas, reapply every 2 hours as needed.  - Call if any new or changing lesions are noted between office visits  - multiple  Sebaceous Hyperplasia - Small yellow papules with a central dell - Benign - Observe  - face, discussed ED, $60 for first lesion and $15  for each additional txted on the same day  Inflamed seborrheic keratosis (5) R clavile x 1, ant base of neck x 2, R side x 1, L arm x 1  Destruction of lesion - R clavile x 1, ant base of neck x 2, R side x 1, L arm x 1 Complexity: simple   Destruction method: cryotherapy   Informed consent: discussed and consent obtained   Timeout:  patient name, date of birth, surgical site, and procedure verified Lesion destroyed using liquid nitrogen: Yes    Region frozen until ice ball extended beyond lesion: Yes   Outcome: patient tolerated procedure well with no complications   Post-procedure details: wound care instructions given    Telangiectasia R nose Benign, observe Treated with laser in past without complete clearing.  Rosacea with history of pustules in morning Head - Anterior (Face) Mild Rosacea is a chronic progressive skin condition usually affecting the face of adults, causing redness and/or acne bumps. It is treatable but not curable. It sometimes affects the eyes (ocular rosacea) as well. It may respond to topical and/or systemic medication and can flare with stress, sun exposure, alcohol, exercise and some foods.  Daily application of broad spectrum spf 30+ sunscreen to face is recommended to reduce flares.  Will prescribe [facility] metronidazole/ivermectin/azelaic acid twice daily as needed to affected areas on the face. The patient was advised this is not covered by insurance since it is made by a compounding pharmacy. They will receive an email to check out and the medication will be mailed to their home.    Skin cancer screening  Return in about 1 year (around 08/04/2022) for TBSE, Hx of BCC, Hx of Dysplastic nevi.  I, Othelia Pulling, RMA, am acting as scribe for Sarina Ser, MD . Documentation: I have reviewed the above documentation for accuracy and completeness, and I agree with the above.  Sarina Ser, MD

## 2021-08-04 NOTE — Patient Instructions (Addendum)
If You Need Anything After Your Visit ° °If you have any questions or concerns for your doctor, please call our main line at [phone] and press option 4 to reach your doctor's medical assistant. If no one answers, please leave a voicemail as directed and we will return your call as soon as possible. Messages left after 4 pm will be answered the following business day.  ° °You may also send us a message via MyChart. We typically respond to MyChart messages within 1-2 business days. ° °For prescription refills, please ask your pharmacy to contact our office. Our fax number is [phone]. ° °If you have an urgent issue when the clinic is closed that cannot wait until the next business day, you can page your doctor at the number below.   ° °Please note that while we do our best to be available for urgent issues outside of office hours, we are not available 24/7.  ° °If you have an urgent issue and are unable to reach us, you may choose to seek medical care at your doctor's office, retail clinic, urgent care center, or emergency room. ° °If you have a medical emergency, please immediately call 911 or go to the emergency department. ° °Pager Numbers ° °- Dr. Kowalski: [phone] ° °- Dr. Moye: [phone] ° °- Dr. Stewart: [phone] ° °In the event of inclement weather, please call our main line at [phone] for an update on the status of any delays or closures. ° °Dermatology Medication Tips: °Please keep the boxes that topical medications come in in order to help keep track of the instructions about where and how to use these. Pharmacies typically print the medication instructions only on the boxes and not directly on the medication tubes.  ° °If your medication is too expensive, please contact our office at [phone] option 4 or send us a message through MyChart.  ° °We are unable to tell what your co-pay for medications will be in advance as this is different depending on your insurance coverage.  However, we may be able to find a substitute medication at lower cost or fill out paperwork to get insurance to cover a needed medication.  ° °If a prior authorization is required to get your medication covered by your insurance company, please allow us 1-2 business days to complete this process. ° °Drug prices often vary depending on where the prescription is filled and some pharmacies may offer cheaper prices. ° °The website www.goodrx.com contains coupons for medications through different pharmacies. The prices here do not account for what the cost may be with help from insurance (it may be cheaper with your insurance), but the website can give you the price if you did not use any insurance.  °- You can print the associated coupon and take it with your prescription to the pharmacy.  °- You may also stop by our office during regular business hours and pick up a GoodRx coupon card.  °- If you need your prescription sent electronically to a different pharmacy, notify our office through [facility] MyChart or by phone at [phone] option 4. ° ° ° ° °Si Usted Necesita Algo Después de Su Visita ° °También puede enviarnos un mensaje a través de MyChart. Por lo general respondemos a los mensajes de MyChart en el transcurso de 1 a 2 días hábiles. ° °Para renovar recetas, por favor pida a su farmacia que se ponga en contacto con nuestra oficina. Nuestro número de fax es el [phone]. ° °Si tiene   un asunto urgente cuando la clnica est cerrada y que no puede esperar hasta el siguiente da hbil, puede llamar/localizar a su doctor(a) al nmero que aparece a continuacin.   Por favor, tenga en cuenta que aunque hacemos todo lo posible para estar disponibles para asuntos urgentes fuera del horario de [place], no estamos disponibles las 24 horas del da, los 7 das de la [place].   Si tiene un problema urgente y no puede comunicarse con nosotros, puede optar por buscar atencin mdica  en el consultorio de su  doctor(a), en una clnica privada, en un centro de atencin urgente o en una sala de emergencias.  Si tiene Engineering geologist, por favor llame inmediatamente al 911 o vaya a la sala de emergencias.  Nmeros de bper  - Dr. Nehemiah Massed: [phone]  - Dra. Moye: [phone]  - Dra. Nicole Kindred: [phone]  En caso de inclemencias del [place], por favor llame a Johnsie Kindred principal al [phone] para una actualizacin sobre el [place] de cualquier retraso o cierre.  Consejos para la medicacin en dermatologa: Por favor, guarde las cajas en las que vienen los medicamentos de uso tpico para ayudarle a seguir las instrucciones sobre dnde y cmo usarlos. Las farmacias generalmente imprimen las instrucciones del medicamento slo en las cajas y no directamente en los tubos del [place].   Si su medicamento es muy caro, por favor, pngase en contacto con Zigmund Daniel llamando al [phone] y presione la opcin 4 o envenos un mensaje a travs de Pharmacist, community.   No podemos decirle cul ser su copago por los medicamentos por adelantado ya que esto es diferente dependiendo de la cobertura de su seguro. Sin embargo, es posible que podamos encontrar un medicamento sustituto a Electrical engineer un formulario para que el seguro cubra el medicamento que se considera necesario.   Si se requiere una autorizacin previa para que su compaa de seguros [place] su medicamento, por favor permtanos de 1 a 2 das hbiles para completar este proceso.  Los precios de los medicamentos varan con frecuencia dependiendo del Environmental consultant de dnde se surte la receta y alguna farmacias pueden ofrecer precios ms baratos.  El sitio web www.goodrx.com tiene cupones para medicamentos de Airline pilot. Los precios aqu no tienen en cuenta lo que podra costar con la ayuda del seguro (puede ser ms barato con su seguro), pero el sitio web puede darle el precio si no utiliz Research scientist (physical sciences).  - Puede imprimir el cupn  correspondiente y llevarlo con su receta a la farmacia.  - Tambin puede pasar por nuestra oficina durante el horario de atencin regular y Charity fundraiser una tarjeta de cupones de GoodRx.  - Si necesita que su receta se enve electrnicamente a una farmacia diferente, informe a nuestra oficina a travs de MyChart de [place] o por telfono llamando al [phone] y presione la opcin 4.  Cryotherapy Aftercare  Wash gently with soap and water everyday.   Apply Vaseline and Band-Aid daily until healed.    Instructions for [facility] Medications  One or more of your medications was sent to the [facility] mail order compounding pharmacy. You will receive an email from them and can purchase the medicine through that link. It will then be mailed to your home at the address you confirmed. If for any reason you do not receive an email from them, please check your spam folder. If you still do not find the email, please let us know. [facility] phone number is [phone].

## 2021-08-13 ENCOUNTER — Other Ambulatory Visit: Payer: 59

## 2021-08-13 ENCOUNTER — Ambulatory Visit: Payer: 59 | Admitting: Internal Medicine

## 2021-08-19 NOTE — Progress Notes (Signed)
Patient on schedule for BMB 08/26/2021, called and spoke with patient and made aware to be here @ 0730, NPO after MN, and driver post procedure/recovery/discharge. Stated understanding.

## 2021-08-25 ENCOUNTER — Other Ambulatory Visit: Payer: Self-pay | Admitting: Physician Assistant

## 2021-08-25 NOTE — H&P (Signed)
Chief Complaint: Patient was seen in consultation today for bone marrow biopsy and aspiration  at the request of Anthony Graves  Referring Physician(s): Anthony Graves  Supervising Physician: Anthony Graves  Patient Status: [facility] - Out-pt  History of Present Illness: Anthony Graves is a 61 y.o. male w/PMH of anemia, diabetes, prostate cancer, basal cell carcinoma.  Patient was referred by Dr. Rogue Graves for bone marrow biopsy with aspiration due to continued thrombocytosis and rule out secondary myelofibrosis or acute leukemia.  Past Medical History:  Diagnosis Date   Arthritis    osteoartiritis   Basal cell carcinoma 10/12/2009   Left post. shoulder sup. lateral scapula, 3cm med. to hemangioma.    Basal cell carcinoma 02/26/2015   Right temple. Nodular.   Basal cell carcinoma 03/21/2019   Right inf. med. pectoral. Nodular. EDC   Basal cell carcinoma 03/21/2019   Left lat. abdomen parallel lat. to umbilicus. Micronodular. EDC.   Dysplastic nevus 01/28/2008   Left upper back medial sup scapula. Moderate to severe atypia, margins involved. Excised 03/04/2008, persistent DN, close to margin.   Dysplastic nevus 07/01/2010   Left low back paraspinal lat. Mild atypiua, margins involved.    Dysplastic nevus 07/01/2010   Left low back paraspinal me. Mild atypia, margins involved.    Gout    High platelet count    Prostate cancer ([place])    per pt    Past Surgical History:  Procedure Laterality Date   COLONOSCOPY  2016   CYSTECTOMY  years ago   pilonidal cyst removed   KNEE SURGERY Right 20 years ago   arthrocscopy   TOTAL KNEE ARTHROPLASTY Right 08/15/2016   Procedure: RIGHT TOTAL KNEE ARTHROPLASTY;  Surgeon: Anthony Arabian, MD;  Location: [facility];  Service: Orthopedics;  Laterality: Right;    Allergies: Penicillins  Medications: Prior to Admission medications   Medication Sig Start Date End Date Taking? Authorizing Provider  allopurinol (ZYLOPRIM) 100 MG  tablet Take 300 mg by mouth daily. 03/23/21   [provider]  anagrelide (AGRYLIN) 0.5 MG capsule TAKE 2 CAPSULES EVERY MORNING AND TAKE 1CAPSULE EVERY EVENING 04/15/21   Anthony Sickle, MD  aspirin EC 81 MG tablet Take 1 tablet by mouth daily.    [provider]  tamsulosin (FLOMAX) 0.4 MG CAPS capsule Take 1 capsule (0.4 mg total) by mouth daily. 06/07/21   Abbie Sons, MD     History reviewed. No pertinent family history.  Social History   Socioeconomic History   Marital status: Married    Spouse name: Not on file   Number of children: Not on file   Years of education: Not on file   Highest education level: Not on file  Occupational History   Not on file  Tobacco Use   Smoking status: Never   Smokeless tobacco: Never  Vaping Use   Vaping Use: Never used  Substance and Sexual Activity   Alcohol use: Yes    Alcohol/week: 3.0 - 7.0 standard drinks    Types: 3 - 7 Glasses of wine per week   Drug use: No   Sexual activity: Not on file  Other Topics Concern   Not on file  Social History Narrative   Not on file   Social Determinants of Health   Financial Resource Strain: Not on file  Food Insecurity: Not on file  Transportation Needs: Not on file  Physical Activity: Not on file  Stress: Not on file  Social Connections: Not on file  Review of Systems: A 12 point ROS discussed and pertinent positives are indicated in the HPI above.  All other systems are negative.  Review of Systems  Constitutional:  Negative for chills and fever.  HENT:  Negative for nosebleeds.   Eyes:  Negative for visual disturbance.  Respiratory:  Negative for cough and shortness of breath.   Cardiovascular:  Positive for leg swelling. Negative for chest pain.  Gastrointestinal:  Negative for abdominal pain, blood in stool, nausea and vomiting.  Genitourinary:  Negative for hematuria.  Neurological:  Negative for dizziness, light-headedness and headaches.   Vital  Signs: BP (!) 141/66    Pulse 78    Temp 97.7 F (36.5 C) (Oral)    Resp 17    Ht '5\' 10"'  (1.778 m)    Wt 170 lb (77.1 kg)    SpO2 100%    BMI 24.39 kg/m   Physical Exam Constitutional:      Appearance: Normal appearance. He is not ill-appearing.  HENT:     Head: Normocephalic and atraumatic.     Mouth/Throat:     Mouth: Mucous membranes are dry.  Eyes:     Extraocular Movements: Extraocular movements intact.     Pupils: Pupils are equal, round, and reactive to light.  Cardiovascular:     Rate and Rhythm: Normal rate and regular rhythm.     Pulses: Normal pulses.     Heart sounds: Normal heart sounds. No murmur heard.   No friction rub. No gallop.  Pulmonary:     Effort: Pulmonary effort is normal. No respiratory distress.     Breath sounds: Normal breath sounds. No stridor. No wheezing, rhonchi or rales.  Abdominal:     General: Abdomen is flat. There is no distension.     Palpations: Abdomen is soft.     Tenderness: There is no abdominal tenderness. There is no guarding.  Musculoskeletal:     Right lower leg: No edema.     Left lower leg: No edema.  Skin:    General: Skin is warm and dry.  Neurological:     Mental Status: He is alert and oriented to person, place, and time.  Psychiatric:        Mood and Affect: Mood normal.        Behavior: Behavior normal.        Thought Content: Thought content normal.        Judgment: Judgment normal.    Imaging: No results found.  Labs:  CBC: Recent Labs    01/27/21 1121 04/21/21 1458 07/14/21 1458 08/26/21 0752  WBC 13.8* 11.0* 11.5* 12.4*  HGB 13.0 12.3* 11.6* 12.7*  HCT 41.2 39.1 36.0* 40.2  PLT 378 298 369 348    COAGS: No results for input(s): INR, APTT in the last 8760 hours.  BMP: Recent Labs    12/30/20 0946 01/27/21 1121 04/21/21 1458 07/14/21 1458  NA 139 138 138 142  K 5.0 5.1 4.5 4.2  CL 103 101 102 103  CO2 '27 28 29 31  ' GLUCOSE 107* 130* 93 97  BUN 26* 22* 19 23  CALCIUM 9.2 9.1 9.0 8.9   CREATININE 1.05 0.94 0.95 1.09  GFRNONAA >60 >60 >60 >60    LIVER FUNCTION TESTS: Recent Labs    12/30/20 0946 01/27/21 1121 04/21/21 1458 07/14/21 1458  BILITOT 0.5 0.7 0.5 0.5  AST '28 25 24 27  ' ALT '21 18 16 20  ' ALKPHOS 46 46 46 47  PROT 7.0 6.7 6.5  6.5  ALBUMIN 4.1 4.2 4.0 3.9    TUMOR MARKERS: No results for input(s): AFPTM, CEA, CA199, CHROMGRNA in the last 8760 hours.  Assessment and Plan: History of anemia, diabetes, prostate cancer, basal cell carcinoma.  Patient was referred by Dr. Rogue Graves for bone marrow biopsy with aspiration due to continued thrombocytosis and rule out secondary myelofibrosis or acute leukemia. Pt sitting upright in bed. He is A&O, calm and pleasant.  He is in no distress.  Wife at bedside.  Pt states he is NPO per order.  Risks and benefits of bone marrow biopsy and aspiration was discussed with the patient and/or patient's family including, but not limited to bleeding, infection, damage to adjacent structures or low yield requiring additional tests.  All of the questions were answered and there is agreement to proceed.  Consent signed and in chart.   Thank you for this interesting consult.  I greatly enjoyed meeting Jermery T Aubuchon and look forward to participating in their care.  A copy of this report was sent to the requesting provider on this date.  Electronically Signed: Tyson Alias, NP 08/26/2021, 8:50 AM   I spent a total of 20 minutes  in face to face in clinical consultation, greater than 50% of which was counseling/coordinating care for bone marrow biopsy and aspiration.

## 2021-08-26 ENCOUNTER — Other Ambulatory Visit: Payer: Self-pay

## 2021-08-26 ENCOUNTER — Ambulatory Visit
Admission: RE | Admit: 2021-08-26 | Discharge: 2021-08-26 | Disposition: A | Discharge status: Home / Self Care | Payer: 59 | Source: Ambulatory Visit | Attending: Internal Medicine | Admitting: Internal Medicine

## 2021-08-26 DIAGNOSIS — D473 Essential (hemorrhagic) thrombocythemia: Secondary | ICD-10-CM | POA: Diagnosis present

## 2021-08-26 DIAGNOSIS — E119 Type 2 diabetes mellitus without complications: Secondary | ICD-10-CM | POA: Diagnosis not present

## 2021-08-26 DIAGNOSIS — D75839 Thrombocytosis, unspecified: Secondary | ICD-10-CM | POA: Insufficient documentation

## 2021-08-26 DIAGNOSIS — D649 Anemia, unspecified: Secondary | ICD-10-CM | POA: Insufficient documentation

## 2021-08-26 LAB — CBC WITH DIFFERENTIAL/PLATELET
Abs Immature Granulocytes: 0.6 10*3/uL — ABNORMAL HIGH (ref 0.00–0.07)
Basophils Absolute: 0.2 10*3/uL — ABNORMAL HIGH (ref 0.0–0.1)
Basophils Relative: 1 %
Eosinophils Absolute: 0 10*3/uL (ref 0.0–0.5)
Eosinophils Relative: 0 %
HCT: 40.2 % (ref 39.0–52.0)
Hemoglobin: 12.7 g/dL — ABNORMAL LOW (ref 13.0–17.0)
Immature Granulocytes: 5 %
Lymphocytes Relative: 19 %
Lymphs Abs: 2.4 10*3/uL (ref 0.7–4.0)
MCH: 31.1 pg (ref 26.0–34.0)
MCHC: 31.6 g/dL (ref 30.0–36.0)
MCV: 98.5 fL (ref 80.0–100.0)
Monocytes Absolute: 1.6 10*3/uL — ABNORMAL HIGH (ref 0.1–1.0)
Monocytes Relative: 13 %
Neutro Abs: 7.6 10*3/uL (ref 1.7–7.7)
Neutrophils Relative %: 62 %
Platelets: 348 10*3/uL (ref 150–400)
RBC: 4.08 MIL/uL — ABNORMAL LOW (ref 4.22–5.81)
RDW: 14.5 % (ref 11.5–15.5)
Smear Review: NORMAL
WBC: 12.4 10*3/uL — ABNORMAL HIGH (ref 4.0–10.5)
nRBC: 0.2 % (ref 0.0–0.2)

## 2021-08-26 MED ORDER — MIDAZOLAM HCL 2 MG/2ML IJ SOLN
INTRAMUSCULAR | Status: AC
  Filled 2021-08-26: qty 4

## 2021-08-26 MED ORDER — FENTANYL CITRATE (PF) 100 MCG/2ML IJ SOLN
INTRAMUSCULAR | Status: AC
  Filled 2021-08-26: qty 2

## 2021-08-26 MED ORDER — HEPARIN SOD (PORK) LOCK FLUSH 100 UNIT/ML IV SOLN
INTRAVENOUS | Status: AC
  Filled 2021-08-26: qty 5

## 2021-08-26 MED ORDER — MIDAZOLAM HCL 2 MG/2ML IJ SOLN
INTRAMUSCULAR | Status: AC | PRN
  Administered 2021-08-26: 1 mg via INTRAVENOUS

## 2021-08-26 MED ORDER — SODIUM CHLORIDE 0.9 % IV SOLN: INTRAVENOUS | Status: DC

## 2021-08-26 MED ORDER — FENTANYL CITRATE (PF) 100 MCG/2ML IJ SOLN
INTRAMUSCULAR | Status: AC | PRN
Start: 2021-08-26 — End: 2021-08-26
  Administered 2021-08-26: 50 ug via INTRAVENOUS

## 2021-08-26 NOTE — Procedures (Signed)
Interventional Radiology Procedure Note  Date of Procedure: 08/26/2021  Procedure: BMBx  Findings:  1. CT BMBx right ilium    Complications: No immediate complications noted.   Estimated Blood Loss: minimal  Follow-up and Recommendations: 1. Bedrest 1 hour    Albin Felling, MD  Vascular & Interventional Radiology  08/26/2021 9:53 AM

## 2021-08-31 LAB — SURGICAL PATHOLOGY

## 2021-09-06 ENCOUNTER — Encounter (HOSPITAL_COMMUNITY): Payer: Self-pay | Admitting: Internal Medicine

## 2021-09-09 ENCOUNTER — Other Ambulatory Visit: Payer: Self-pay

## 2021-09-09 ENCOUNTER — Encounter: Payer: Self-pay | Admitting: Internal Medicine

## 2021-09-09 ENCOUNTER — Inpatient Hospital Stay: Payer: 59 | Attending: Internal Medicine | Admitting: Internal Medicine

## 2021-09-09 DIAGNOSIS — M109 Gout, unspecified: Secondary | ICD-10-CM | POA: Insufficient documentation

## 2021-09-09 DIAGNOSIS — Z79899 Other long term (current) drug therapy: Secondary | ICD-10-CM | POA: Diagnosis not present

## 2021-09-09 DIAGNOSIS — Z7982 Long term (current) use of aspirin: Secondary | ICD-10-CM | POA: Insufficient documentation

## 2021-09-09 DIAGNOSIS — D473 Essential (hemorrhagic) thrombocythemia: Secondary | ICD-10-CM | POA: Insufficient documentation

## 2021-09-09 NOTE — Progress Notes (Signed)
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  Patient continues to be asymptomatic.  LDH slightly elevated.  Continue aspirin/anagrelide  DIAGNOSIS: ET  STAGE: low risk        ;GOALS: control  CURRENT/MOST RECENT THERAPY : Anagrelid    Essential thrombocytosis (HCC)   INTERVAL HISTORY: Ambulating independently.  Alone.  A very pleasant 61-year-old male patient with above history of essential thrombocytosis currently on aspirin/anagrelide is here for follow-up review results of the bone marrow biopsy.  Bone marrow biopsy uneventful.   Patient denies any further flareups of gout.He is currently on allopurinol for his gout.  Patient denies any unusual shortness of breath or cough.  Denies any fatigue.  No weight loss.    Review of Systems  Constitutional:  Negative for chills, diaphoresis, fever, malaise/fatigue and weight loss.  HENT:  Negative for nosebleeds and sore throat.   Eyes:  Negative for double vision.  Respiratory:  Negative for cough, hemoptysis, sputum production, shortness of breath and wheezing.   Cardiovascular:  Negative for chest pain, palpitations, orthopnea and leg swelling.   Gastrointestinal:  Negative for abdominal pain, blood in stool, constipation, diarrhea, heartburn, melena, nausea and vomiting.  Genitourinary:  Negative for dysuria, frequency and urgency.  Musculoskeletal:  Negative for back pain and joint pain.  Skin: Negative.  Negative for itching and rash.  Neurological:  Negative for dizziness, tingling, focal weakness, weakness and headaches.  Endo/Heme/Allergies:  Does not bruise/bleed easily.  Psychiatric/Behavioral:  Negative for depression. The patient is not nervous/anxious and does not have insomnia.   PAST MEDICAL HISTORY :  Past Medical History:  Diagnosis Date   Arthritis    osteoartiritis   Basal cell carcinoma 10/12/2009   Left post. shoulder sup. lateral scapula, 3cm med. to hemangioma.    Basal cell carcinoma 02/26/2015   Right temple. Nodular.   Basal cell carcinoma 03/21/2019   Right inf. med. pectoral. Nodular. EDC   Basal cell carcinoma 03/21/2019   Left lat. abdomen parallel lat. to umbilicus. Micronodular. EDC.   Dysplastic nevus 01/28/2008   Left upper back medial sup scapula. Moderate to severe atypia, margins involved. Excised 03/04/2008, persistent DN, close to margin.   Dysplastic nevus 07/01/2010   Left low back paraspinal lat. Mild atypiua, margins involved.    Dysplastic nevus 07/01/2010   Left low back paraspinal me. Mild atypia, margins involved.    Gout    High platelet count    [place]    per pt    PAST SURGICAL HISTORY :   Past Surgical History:  Procedure Laterality Date   BONE MARROW BIOPSY     COLONOSCOPY  2016   CYSTECTOMY  years ago   pilonidal cyst removed   KNEE SURGERY Right 20 years ago   arthrocscopy   TOTAL KNEE ARTHROPLASTY Right 08/15/2016   Procedure: RIGHT TOTAL KNEE  ARTHROPLASTY;  Surgeon: Gaynelle Arabian, MD;  Location: [facility];  Service: Orthopedics;  Laterality: Right;    FAMILY HISTORY :  History reviewed. No pertinent family history.  SOCIAL HISTORY:   Social  History   Tobacco Use   Smoking status: Never   Smokeless tobacco: Never  Vaping Use   Vaping Use: Never used  Substance Use Topics   Alcohol use: Yes    Alcohol/week: 3.0 - 7.0 standard drinks    Types: 3 - 7 Glasses of wine per week   Drug use: No    ALLERGIES:  is allergic to penicillins.  MEDICATIONS:  Current Outpatient Medications  Medication Sig Dispense Refill   allopurinol (ZYLOPRIM) 100 MG tablet Take 300 mg by mouth daily.     anagrelide (AGRYLIN) 0.5 MG capsule TAKE 2 CAPSULES EVERY MORNING AND TAKE 1CAPSULE EVERY EVENING 270 capsule 1   aspirin EC 81 MG tablet Take 1 tablet by mouth daily.     tamsulosin (FLOMAX) 0.4 MG CAPS capsule Take 1 capsule (0.4 mg total) by mouth daily. 90 capsule 3   No current facility-administered medications for this visit.    PHYSICAL EXAMINATION: ECOG PERFORMANCE STATUS: 0 - Asymptomatic  BP (!) 135/59 (BP Location: Left Arm, Patient Position: Sitting, Cuff Size: Normal)    Pulse 65    Temp (!) 97.2 F (36.2 C) (Tympanic)    Ht '5\' 10"'  (1.778 m)    Wt 174 lb 3.2 oz (79 kg)    SpO2 100%    BMI 25.00 kg/m   Filed Weights   09/09/21 0930  Weight: 174 lb 3.2 oz (79 kg)    Physical Exam HENT:     Head: Normocephalic and atraumatic.     Mouth/Throat:     Pharynx: No oropharyngeal exudate.  Eyes:     Pupils: Pupils are equal, round, and reactive to light.  Cardiovascular:     Rate and Rhythm: Normal rate and regular rhythm.  Pulmonary:     Effort: No respiratory distress.     Breath sounds: Normal breath sounds. No wheezing.  Abdominal:     General: Bowel sounds are normal. There is no distension.     Palpations: Abdomen is soft. There is no mass.     Tenderness: There is no abdominal tenderness. There is no guarding or rebound.  Musculoskeletal:        General: No tenderness. Normal range of motion.     Cervical back: Normal range of motion and neck supple.  Skin:    General: Skin is warm.  Neurological:     Mental  Status: He is alert and oriented to person, place, and time.  Psychiatric:        Mood and Affect: Affect normal.    LABORATORY DATA:  I have reviewed the data as listed    Component Value Date/Time   NA 142 07/14/2021 1458   NA 143 07/29/2016 0844   K 4.2 07/14/2021 1458   CL 103 07/14/2021 1458   CO2 31 07/14/2021 1458   GLUCOSE 97 07/14/2021 1458   BUN 23 07/14/2021 1458   BUN 20 07/29/2016 0844   CREATININE 1.09 07/14/2021 1458   CREATININE 1.10 04/25/2014 0823   CALCIUM 8.9 07/14/2021 1458   PROT 6.5 07/14/2021 1458   PROT 6.7 07/29/2016 0844   PROT 6.7 04/25/2014 0823   ALBUMIN 3.9 07/14/2021 1458   ALBUMIN 4.5 07/29/2016 0844   ALBUMIN 3.8 04/25/2014 0823   AST 27 07/14/2021 1458   AST  22 04/25/2014 0823   ALT 20 07/14/2021 1458   ALT 26 04/25/2014 0823   ALKPHOS 47 07/14/2021 1458   ALKPHOS 44 (L) 04/25/2014 0823   BILITOT 0.5 07/14/2021 1458   BILITOT 0.6 07/29/2016 0844   BILITOT 0.6 04/25/2014 0823   GFRNONAA >60 07/14/2021 1458   GFRNONAA >60 04/25/2014 0823   GFRNONAA >60 02/04/2014 0830   GFRAA >60 12/25/2019 1324   GFRAA >60 04/25/2014 0823   GFRAA >60 02/04/2014 0830    No results found for: SPEP, UPEP  Lab Results  Component Value Date   WBC 12.4 (H) 08/26/2021   NEUTROABS 7.6 08/26/2021   HGB 12.7 (L) 08/26/2021   HCT 40.2 08/26/2021   MCV 98.5 08/26/2021   PLT 348 08/26/2021      Chemistry      Component Value Date/Time   NA 142 07/14/2021 1458   NA 143 07/29/2016 0844   K 4.2 07/14/2021 1458   CL 103 07/14/2021 1458   CO2 31 07/14/2021 1458   BUN 23 07/14/2021 1458   BUN 20 07/29/2016 0844   CREATININE 1.09 07/14/2021 1458   CREATININE 1.10 04/25/2014 0823   GLU 89 01/01/2014 0000      Component Value Date/Time   CALCIUM 8.9 07/14/2021 1458   ALKPHOS 47 07/14/2021 1458   ALKPHOS 44 (L) 04/25/2014 0823   AST 27 07/14/2021 1458   AST 22 04/25/2014 0823   ALT 20 07/14/2021 1458   ALT 26 04/25/2014 0823   BILITOT 0.5  07/14/2021 1458   BILITOT 0.6 07/29/2016 0844   BILITOT 0.6 04/25/2014 0823         ASSESSMENT & PLAN:   Essential thrombocytosis ([place]) # Essential thrombocytosis-LOW risk; [positive for CALR mutation]; however hemoglobin is 11-12; white count 11-12.  Given the presence of few blasts on the peripheral flow cytometry-patient had a bone marrow biopsy.  #FEB 2023-bone marrow biopsy shows 80% hypercellularity; with fibrosis grade 2; no evidence of any blasts or concerns for progression obvious myelofibrosis.  Patient continues to be asymptomatic.  LDH slightly elevated.  Continue aspirin/anagrelide.  Given the longstanding history of essential thrombocytosis; and patient excellent current health-I think it is reasonable for a second opinion at [facility]; hematology department Dr. Evelene Croon.   # History of gout- left big toe-allopurinol- STABLE.   # Elevated PSA/prostate cancer ~ [Dr.Stoiff]- MRI/[facility]- currently on surveillance.   # DISPOSITION:  # referral Dr.Reeves; [facility]- hematology re: MPN/ET # Follow-up in 4 months; MD; labs-CBC CMP/LDH- Dr.B  Cc:   Dr. Rosanna Randy.     Cammie Sickle, MD 09/09/2021 5:27 PM

## 2021-09-09 NOTE — Assessment & Plan Note (Addendum)
#  Essential thrombocytosis-LOW risk; [positive for CALR mutation]; however hemoglobin is 11-12; white count 11-12.  Given the presence of few blasts on the peripheral flow cytometry-patient had a bone marrow biopsy.  #FEB 2023-bone marrow biopsy shows 80% hypercellularity; with fibrosis grade 2; no evidence of any blasts or concerns for progression obvious myelofibrosis.  Patient continues to be asymptomatic.  LDH slightly elevated.  Continue aspirin/anagrelide.  Given the longstanding history of essential thrombocytosis; and patient excellent current health-I think it is reasonable for a second opinion at [facility]; hematology department Dr. Evelene Croon.   # History of gout- left big toe-allopurinol- STABLE.   # Elevated PSA/prostate cancer ~ [Dr.Stoiff]- MRI/[facility]- currently on surveillance.   # DISPOSITION:  # referral Dr.Reeves; [facility]- hematology re: MPN/ET # Follow-up in 4 months; MD; labs-CBC CMP/LDH- Dr.B  Cc:   Dr. Rosanna Randy.

## 2021-09-23 LAB — SURGICAL PATHOLOGY

## 2021-10-21 ENCOUNTER — Other Ambulatory Visit: Payer: Self-pay | Admitting: Internal Medicine

## 2021-10-21 NOTE — Telephone Encounter (Signed)
CBC with Differential/Platelet ?Order: [id number] ?Status: Final result    ?Visible to patient: Yes (seen)    ?Next appt: 01/06/2022 at 09:00 AM in Oncology ([facility])    ?0 Result Notes ?          ?Component Ref Range & Units 1 mo ago ?(08/26/21) 3 mo ago ?(07/14/21) 6 mo ago ?(04/21/21) 8 mo ago ?(01/27/21) 9 mo ago ?(12/30/20) 1 yr ago ?(06/24/20) 1 yr ago ?(04/29/20)  ?WBC 4.0 - 10.5 K/uL 12.4 High   11.5 High   11.0 High   13.8 High   28.9 High   12.1 High     ?RBC 4.22 - 5.81 MIL/uL 4.08 Low   3.65 Low   3.95 Low   4.16 Low   4.25  4.23  None seen R   ?Hemoglobin 13.0 - 17.0 g/dL 12.7 Low   11.6 Low   12.3 Low   13.0  13.4  13.1    ?HCT 39.0 - 52.0 % 40.2  36.0 Low   39.1  41.2  41.4  41.5    ?MCV 80.0 - 100.0 fL 98.5  98.6  99.0  99.0  97.4  98.1    ?MCH 26.0 - 34.0 pg 31.1  31.8  31.1  31.3  31.5  31.0    ?MCHC 30.0 - 36.0 g/dL 31.6  32.2  31.5  31.6  32.4  31.6    ?RDW 11.5 - 15.5 % 14.5  15.0  14.5  14.9  15.1  14.6    ?Platelets 150 - 400 K/uL 348  369  298  378  457 High   313    ?nRBC 0.0 - 0.2 % 0.2  0.3 High   0.0  0.2  0.1  0.2    ?Neutrophils Relative % % 62  64  64  69  78  65    ?Neutro Abs 1.7 - 7.7 K/uL 7.6  7.5  7.1  9.6 High   22.5 High   7.9 High     ?Lymphocytes Relative % '19  17  19  16  9  19    '$ ?Lymphs Abs 0.7 - 4.0 K/uL 2.4  1.9  2.1  2.3  2.6  2.2    ?Monocytes Relative % '13  12  12  10  7  11    '$ ?Monocytes Absolute 0.1 - 1.0 K/uL 1.6 High   1.3 High   1.3 High   1.3 High   2.1 High   1.3 High     ?Eosinophils Relative % 0  0  0  0  0  0    ?Eosinophils Absolute 0.0 - 0.5 K/uL 0.0  0.0  0.0  0.0  0.0  0.1    ?Basophils Relative % '1  1  1  1  1  1    '$ ?Basophils Absolute 0.0 - 0.1 K/uL 0.2 High   0.1  0.1  0.1  0.2 High   0.2 High     ?WBC Morphology  MILD LEFT SHIFT (1-5% METAS, OCC MYELO, OCC BANDS)  DIFF CONFIRMED BY MANUAL.   MILD LEFT SHIFT (1-5% METAS, OCC MYELO, OCC BANDS) CM  MARKED LEFT SHIFT (>5% METAS,MYELOS AND PROS, OCC BLAST NOTED) CM     ?RBC Morphology  MORPHOLOGY UNREMARKABLE   MIXED RBC POPULATION.   MORPHOLOGY UNREMARKABLE CM      ?Smear Review  Normal platelet morphology  PLATELETS APPEAR ADEQUATE CM   PLATELETS APPEAR ADEQUATE  PLATELETS APPEAR ADEQUATE     ?  Immature Granulocytes % '5  6  4  4  5  4    '$ ?Abs Immature Granulocytes 0.00 - 0.07 K/uL 0.60 High   0.63 High  CM  0.42 High  CM  0.49 High   1.52 High   0.49 High  CM    ?Comment: Performed at [facility], [address], [place], [place] [address]  ?Giant PLTs     PRESENT CM  PRESENT CM     ?Polychromasia      PRESENT     ?Ovalocytes      PRESENT     ?WBC, UA        6-10 Abnormal  R   ?Epithelial Cells (non renal)        None seen R   ?Bacteria, UA        None seen R   ?Resulting Agency  [facility] [place] [place] [facility] [place] [facility] [facility]  ?  ? ?  ?  ?Specimen Collected: 08/26/21 07:52 Last Resulted: 08/26/21 08:32  ?  ?  ? ?

## 2021-11-24 DIAGNOSIS — L570 Actinic keratosis: Secondary | ICD-10-CM | POA: Insufficient documentation

## 2021-11-24 DIAGNOSIS — M1A0790 Idiopathic chronic gout, unspecified ankle and foot, without tophus (tophi): Secondary | ICD-10-CM | POA: Insufficient documentation

## 2021-12-14 ENCOUNTER — Encounter: Payer: Self-pay | Admitting: Internal Medicine

## 2021-12-14 DIAGNOSIS — D473 Essential (hemorrhagic) thrombocythemia: Secondary | ICD-10-CM

## 2021-12-14 NOTE — Telephone Encounter (Signed)
Lab order entered.

## 2021-12-15 ENCOUNTER — Encounter: Payer: Self-pay | Admitting: Internal Medicine

## 2021-12-15 ENCOUNTER — Other Ambulatory Visit: Payer: Self-pay

## 2021-12-15 ENCOUNTER — Inpatient Hospital Stay: Payer: 59 | Attending: Internal Medicine

## 2021-12-15 DIAGNOSIS — R7402 Elevation of levels of lactic acid dehydrogenase (LDH): Secondary | ICD-10-CM | POA: Insufficient documentation

## 2021-12-15 DIAGNOSIS — D473 Essential (hemorrhagic) thrombocythemia: Secondary | ICD-10-CM | POA: Diagnosis not present

## 2021-12-15 DIAGNOSIS — Z79899 Other long term (current) drug therapy: Secondary | ICD-10-CM | POA: Insufficient documentation

## 2021-12-15 DIAGNOSIS — M109 Gout, unspecified: Secondary | ICD-10-CM | POA: Insufficient documentation

## 2021-12-15 LAB — CBC WITH DIFFERENTIAL/PLATELET
Abs Immature Granulocytes: 0 10*3/uL (ref 0.00–0.07)
Basophils Absolute: 0 10*3/uL (ref 0.0–0.1)
Basophils Relative: 0 %
Blasts: 3 %
Eosinophils Absolute: 0 10*3/uL (ref 0.0–0.5)
Eosinophils Relative: 0 %
HCT: 38.6 % — ABNORMAL LOW (ref 39.0–52.0)
Hemoglobin: 12.2 g/dL — ABNORMAL LOW (ref 13.0–17.0)
Lymphocytes Relative: 23 %
Lymphs Abs: 2.8 10*3/uL (ref 0.7–4.0)
MCH: 31.9 pg (ref 26.0–34.0)
MCHC: 31.6 g/dL (ref 30.0–36.0)
MCV: 100.8 fL — ABNORMAL HIGH (ref 80.0–100.0)
Monocytes Absolute: 1.2 10*3/uL — ABNORMAL HIGH (ref 0.1–1.0)
Monocytes Relative: 10 %
Neutro Abs: 7.8 10*3/uL — ABNORMAL HIGH (ref 1.7–7.7)
Neutrophils Relative %: 64 %
Platelets: 423 10*3/uL — ABNORMAL HIGH (ref 150–400)
RBC: 3.83 MIL/uL — ABNORMAL LOW (ref 4.22–5.81)
RDW: 15.3 % (ref 11.5–15.5)
Smear Review: ADEQUATE
WBC Morphology: 3
WBC: 12.2 10*3/uL — ABNORMAL HIGH (ref 4.0–10.5)
nRBC: 0 % (ref 0.0–0.2)

## 2021-12-15 LAB — COMPREHENSIVE METABOLIC PANEL
ALT: 18 U/L (ref 0–44)
AST: 32 U/L (ref 15–41)
Albumin: 3.8 g/dL (ref 3.5–5.0)
Alkaline Phosphatase: 42 U/L (ref 38–126)
Anion gap: 7 (ref 5–15)
BUN: 25 mg/dL — ABNORMAL HIGH (ref 8–23)
CO2: 27 mmol/L (ref 22–32)
Calcium: 8.4 mg/dL — ABNORMAL LOW (ref 8.9–10.3)
Chloride: 102 mmol/L (ref 98–111)
Creatinine, Ser: 0.99 mg/dL (ref 0.61–1.24)
GFR, Estimated: >60 mL/min (ref >60)
Glucose, Bld: 112 mg/dL — ABNORMAL HIGH (ref 70–99)
Potassium: 4.6 mmol/L (ref 3.5–5.1)
Sodium: 136 mmol/L (ref 135–145)
Total Bilirubin: 0.8 mg/dL (ref 0.3–1.2)
Total Protein: 6.4 g/dL — ABNORMAL LOW (ref 6.5–8.1)

## 2021-12-15 LAB — PATHOLOGIST SMEAR REVIEW

## 2021-12-15 LAB — URIC ACID: Uric Acid, Serum: 5.7 mg/dL (ref 3.7–8.6)

## 2021-12-15 LAB — LACTATE DEHYDROGENASE: LDH: 443 U/L — ABNORMAL HIGH (ref 98–192)

## 2021-12-15 NOTE — Progress Notes (Signed)
I spoke to patient regarding the results of the bloodwork. Fairly stable except LDH elevated. Also about 3% blasts noted in the smear. Will do follow-up bloodwork in one month. Will update Dr. Evelene Croon at [facility]. Continue monitoring off. Anagrelide.  GB

## 2022-01-06 ENCOUNTER — Other Ambulatory Visit: Payer: Self-pay

## 2022-01-06 ENCOUNTER — Inpatient Hospital Stay: Payer: 59 | Attending: Internal Medicine

## 2022-01-06 ENCOUNTER — Encounter: Payer: Self-pay | Admitting: Internal Medicine

## 2022-01-06 ENCOUNTER — Inpatient Hospital Stay (HOSPITAL_BASED_OUTPATIENT_CLINIC_OR_DEPARTMENT_OTHER): Payer: 59 | Admitting: Internal Medicine

## 2022-01-06 DIAGNOSIS — D473 Essential (hemorrhagic) thrombocythemia: Secondary | ICD-10-CM | POA: Insufficient documentation

## 2022-01-06 DIAGNOSIS — Z79899 Other long term (current) drug therapy: Secondary | ICD-10-CM | POA: Diagnosis not present

## 2022-01-06 DIAGNOSIS — M109 Gout, unspecified: Secondary | ICD-10-CM | POA: Insufficient documentation

## 2022-01-06 DIAGNOSIS — Z7982 Long term (current) use of aspirin: Secondary | ICD-10-CM | POA: Diagnosis not present

## 2022-01-06 LAB — CBC WITH DIFFERENTIAL/PLATELET
Abs Immature Granulocytes: 0 10*3/uL (ref 0.00–0.07)
Basophils Absolute: 0 10*3/uL (ref 0.0–0.1)
Basophils Relative: 0 %
Blasts: 3 %
Eosinophils Absolute: 0 10*3/uL (ref 0.0–0.5)
Eosinophils Relative: 0 %
HCT: 39.9 % (ref 39.0–52.0)
Hemoglobin: 12.5 g/dL — ABNORMAL LOW (ref 13.0–17.0)
Lymphocytes Relative: 16 %
Lymphs Abs: 1.8 10*3/uL (ref 0.7–4.0)
MCH: 31.9 pg (ref 26.0–34.0)
MCHC: 31.3 g/dL (ref 30.0–36.0)
MCV: 101.8 fL — ABNORMAL HIGH (ref 80.0–100.0)
Monocytes Absolute: 1.2 10*3/uL — ABNORMAL HIGH (ref 0.1–1.0)
Monocytes Relative: 10 %
Neutro Abs: 8.2 10*3/uL — ABNORMAL HIGH (ref 1.7–7.7)
Neutrophils Relative %: 71 %
Platelets: 412 10*3/uL — ABNORMAL HIGH (ref 150–400)
RBC: 3.92 MIL/uL — ABNORMAL LOW (ref 4.22–5.81)
RDW: 15.2 % (ref 11.5–15.5)
Smear Review: ADEQUATE
WBC: 11.5 10*3/uL — ABNORMAL HIGH (ref 4.0–10.5)
nRBC: 0.2 % (ref 0.0–0.2)

## 2022-01-06 LAB — URIC ACID: Uric Acid, Serum: 5.6 mg/dL (ref 3.7–8.6)

## 2022-01-06 LAB — COMPREHENSIVE METABOLIC PANEL
ALT: 18 U/L (ref 0–44)
AST: 28 U/L (ref 15–41)
Albumin: 4.1 g/dL (ref 3.5–5.0)
Alkaline Phosphatase: 44 U/L (ref 38–126)
Anion gap: 8 (ref 5–15)
BUN: 20 mg/dL (ref 8–23)
CO2: 27 mmol/L (ref 22–32)
Calcium: 9 mg/dL (ref 8.9–10.3)
Chloride: 103 mmol/L (ref 98–111)
Creatinine, Ser: 0.99 mg/dL (ref 0.61–1.24)
GFR, Estimated: >60 mL/min (ref >60)
Glucose, Bld: 72 mg/dL (ref 70–99)
Potassium: 4.2 mmol/L (ref 3.5–5.1)
Sodium: 138 mmol/L (ref 135–145)
Total Bilirubin: 0.6 mg/dL (ref 0.3–1.2)
Total Protein: 6.7 g/dL (ref 6.5–8.1)

## 2022-01-06 LAB — LACTATE DEHYDROGENASE: LDH: 427 U/L — ABNORMAL HIGH (ref 98–192)

## 2022-01-06 NOTE — Progress Notes (Unsigned)
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  Patient continues to be asymptomatic.  LDH slightly elevated.  Continue aspirin/anagrelide  DIAGNOSIS: ET  STAGE: low risk        ;GOALS: control  CURRENT/MOST RECENT THERAPY : Anagrelid    Essential thrombocytosis (HCC)   INTERVAL HISTORY: Ambulating independently.  Alone.  A very pleasant 61 -year-old male patient with above history of essential thrombocytosis currently on aspirin/anagrelide is here for follow-up review results of the bone marrow biopsy.  Bone marrow biopsy uneventful.   Patient denies any further flareups of gout.He is currently on allopurinol for his gout.  Patient denies any unusual shortness of breath or cough.  Denies any fatigue.  No weight loss.    Review of Systems  Constitutional:  Negative for chills, diaphoresis, fever, malaise/fatigue and weight loss.  HENT:  Negative for nosebleeds and sore throat.   Eyes:  Negative for double vision.  Respiratory:  Negative for cough, hemoptysis, sputum production, shortness of breath and wheezing.   Cardiovascular:  Negative for chest pain, palpitations, orthopnea and leg swelling.   Gastrointestinal:  Negative for abdominal pain, blood in stool, constipation, diarrhea, heartburn, melena, nausea and vomiting.  Genitourinary:  Negative for dysuria, frequency and urgency.  Musculoskeletal:  Negative for back pain and joint pain.  Skin: Negative.  Negative for itching and rash.  Neurological:  Negative for dizziness, tingling, focal weakness, weakness and headaches.  Endo/Heme/Allergies:  Does not bruise/bleed easily.  Psychiatric/Behavioral:  Negative for depression. The patient is not nervous/anxious and does not have insomnia.   PAST MEDICAL HISTORY :  Past Medical History:  Diagnosis Date  . Arthritis    osteoartiritis  . Basal cell carcinoma 10/12/2009   Left post. shoulder sup. lateral scapula, 3cm med. to hemangioma.   . Basal cell carcinoma 02/26/2015   Right temple. Nodular.  . Basal cell carcinoma 03/21/2019   Right inf. med. pectoral. Nodular. EDC  . Basal cell carcinoma 03/21/2019   Left lat. abdomen parallel lat. to umbilicus. Micronodular. EDC.  [name] Kitchen Dysplastic nevus 01/28/2008   Left upper back medial sup scapula. Moderate to severe atypia, margins involved. Excised 03/04/2008, persistent DN, close to margin.  [name] Kitchen Dysplastic nevus 07/01/2010   Left low back paraspinal lat. Mild atypiua, margins involved.   [name] Kitchen Dysplastic nevus 07/01/2010   Left low back paraspinal me. Mild atypia, margins involved.   . Gout   . High platelet count   . Prostate cancer ([place])    per pt    PAST SURGICAL HISTORY :   Past Surgical History:  Procedure Laterality Date  . BONE MARROW BIOPSY    . COLONOSCOPY  2016  . CYSTECTOMY  years ago   pilonidal cyst removed  . KNEE SURGERY Right 20 years ago   arthrocscopy  . TOTAL KNEE ARTHROPLASTY Right 08/15/2016   Procedure: RIGHT TOTAL KNEE  ARTHROPLASTY;  Surgeon: Gaynelle Arabian, MD;  Location: [facility];  Service: Orthopedics;  Laterality: Right;    FAMILY HISTORY :  No family history on file.  SOCIAL HISTORY:   Social History    Tobacco Use  . Smoking status: Never  . Smokeless tobacco: Never  Vaping Use  . Vaping Use: Never used  Substance Use Topics  . Alcohol use: Yes    Alcohol/week: 3.0 - 7.0 standard drinks of alcohol    Types: 3 - 7 Glasses of wine per week  . Drug use: No    ALLERGIES:  is allergic to penicillins.  MEDICATIONS:  Current Outpatient Medications  Medication Sig Dispense Refill  . allopurinol (ZYLOPRIM) 100 MG tablet Take 300 mg by mouth daily.    [name] Kitchen anagrelide (AGRYLIN) 0.5 MG capsule TAKE 2 CAPSULES EVERY MORNING AND TAKE 1CAPSULE EVERY EVENING 270 capsule 1  . aspirin EC 81 MG tablet Take 1 tablet by mouth daily.    . tamsulosin (FLOMAX) 0.4 MG CAPS capsule Take 1 capsule (0.4 mg total) by mouth daily. 90 capsule 3   No current facility-administered medications for this visit.    PHYSICAL EXAMINATION: ECOG PERFORMANCE STATUS: 0 - Asymptomatic  There were no vitals taken for this visit.  There were no vitals filed for this visit.   Physical Exam HENT:     Head: Normocephalic and atraumatic.     Mouth/Throat:     Pharynx: No oropharyngeal exudate.  Eyes:     Pupils: Pupils are equal, round, and reactive to light.  Cardiovascular:     Rate and Rhythm: Normal rate and regular rhythm.  Pulmonary:     Effort: No respiratory distress.     Breath sounds: Normal breath sounds. No wheezing.  Abdominal:     General: Bowel sounds are normal. There is no distension.     Palpations: Abdomen is soft. There is no mass.     Tenderness: There is no abdominal tenderness. There is no guarding or rebound.  Musculoskeletal:        General: No tenderness. Normal range of motion.     Cervical back: Normal range of motion and neck supple.  Skin:    General: Skin is warm.  Neurological:     Mental Status: He is alert and oriented to person, place, and time.  Psychiatric:        Mood and Affect: Affect normal.    LABORATORY DATA:  I have reviewed the data as listed    Component  Value Date/Time   NA 136 12/15/2021 0822   NA 143 07/29/2016 0844   K 4.6 12/15/2021 0822   CL 102 12/15/2021 0822   CO2 27 12/15/2021 0822   GLUCOSE 112 (H) 12/15/2021 0822   BUN 25 (H) 12/15/2021 0822   BUN 20 07/29/2016 0844   CREATININE 0.99 12/15/2021 0822   CREATININE 1.10 04/25/2014 0823   CALCIUM 8.4 (L) 12/15/2021 0822   PROT 6.4 (L) 12/15/2021 0822   PROT 6.7 07/29/2016 0844   PROT 6.7 04/25/2014 0823   ALBUMIN 3.8 12/15/2021 0822   ALBUMIN 4.5 07/29/2016 0844   ALBUMIN 3.8 04/25/2014 0823   AST 32 12/15/2021 0822   AST 22 04/25/2014 0823   ALT 18 12/15/2021 0822   ALT 26 04/25/2014 0823   ALKPHOS 42 12/15/2021 0822   ALKPHOS 44 (L) 04/25/2014 0823   BILITOT 0.8 12/15/2021 0822   BILITOT 0.6 07/29/2016 0844   BILITOT 0.6 04/25/2014 0823   GFRNONAA >60 12/15/2021 7341  GFRNONAA >60 04/25/2014 0823   GFRNONAA >60 02/04/2014 0830   GFRAA >60 12/25/2019 1324   GFRAA >60 04/25/2014 0823   GFRAA >60 02/04/2014 0830    No results found for: "SPEP", "UPEP"  Lab Results  Component Value Date   WBC 12.2 (H) 12/15/2021   NEUTROABS 7.8 (H) 12/15/2021   HGB 12.2 (L) 12/15/2021   HCT 38.6 (L) 12/15/2021   MCV 100.8 (H) 12/15/2021   PLT 423 (H) 12/15/2021      Chemistry      Component Value Date/Time   NA 136 12/15/2021 0822   NA 143 07/29/2016 0844   K 4.6 12/15/2021 0822   CL 102 12/15/2021 0822   CO2 27 12/15/2021 0822   BUN 25 (H) 12/15/2021 0822   BUN 20 07/29/2016 0844   CREATININE 0.99 12/15/2021 0822   CREATININE 1.10 04/25/2014 0823   GLU 89 01/01/2014 0000      Component Value Date/Time   CALCIUM 8.4 (L) 12/15/2021 0822   ALKPHOS 42 12/15/2021 0822   ALKPHOS 44 (L) 04/25/2014 0823   AST 32 12/15/2021 0822   AST 22 04/25/2014 0823   ALT 18 12/15/2021 0822   ALT 26 04/25/2014 0823   BILITOT 0.8 12/15/2021 0822   BILITOT 0.6 07/29/2016 0844   BILITOT 0.6 04/25/2014 0823         ASSESSMENT & PLAN:   No problem-specific Assessment &  Plan notes found for this encounter.     Cammie Sickle, MD 01/06/2022 8:52 AM

## 2022-01-06 NOTE — Assessment & Plan Note (Addendum)
#  Essential thrombocytosis-LOW risk; [positive for CALR mutation]; however hemoglobin is 12; white count 11-12. FEB 2023-bone marrow biopsy shows 80% hypercellularity; with fibrosis grade 2; no evidence of any blasts or concerns for progression obvious myelofibrosis.   #Patient currently off anagrelide given the concerns of fibrosis from anagrelide.  Currently on hold.  Platelets 421; LDH slightly improving.  Continue holding of anagrelide.  Discussed with Dr. Evelene Croon.    # History of gout- left big toe-allopurinol- STABLE; awaiting uric acid  # Elevated PSA/prostate cancer ~ [Dr.Stoiff]- MRI/[facility]- currently on surveillance.   # DISPOSITION:  # monthly- cbc/LDH x4  # Follow-up in 4 months; MD; labs-CBC CMP/LDH/IURIC acid- Dr.B  Cc:   Dr. Rosanna Randy.

## 2022-02-03 ENCOUNTER — Inpatient Hospital Stay: Payer: 59

## 2022-02-03 ENCOUNTER — Ambulatory Visit: Payer: Self-pay | Admitting: Urology

## 2022-02-10 ENCOUNTER — Inpatient Hospital Stay: Payer: 59 | Attending: Internal Medicine

## 2022-02-10 DIAGNOSIS — D473 Essential (hemorrhagic) thrombocythemia: Secondary | ICD-10-CM | POA: Diagnosis present

## 2022-02-10 LAB — CBC WITH DIFFERENTIAL/PLATELET
Abs Immature Granulocytes: 0.5 10*3/uL — ABNORMAL HIGH (ref 0.00–0.07)
Basophils Absolute: 0.1 10*3/uL (ref 0.0–0.1)
Basophils Relative: 1 %
Eosinophils Absolute: 0.1 10*3/uL (ref 0.0–0.5)
Eosinophils Relative: 1 %
HCT: 40.5 % (ref 39.0–52.0)
Hemoglobin: 13 g/dL (ref 13.0–17.0)
Immature Granulocytes: 4 %
Lymphocytes Relative: 19 %
Lymphs Abs: 2.2 10*3/uL (ref 0.7–4.0)
MCH: 32.3 pg (ref 26.0–34.0)
MCHC: 32.1 g/dL (ref 30.0–36.0)
MCV: 100.7 fL — ABNORMAL HIGH (ref 80.0–100.0)
Monocytes Absolute: 1.7 10*3/uL — ABNORMAL HIGH (ref 0.1–1.0)
Monocytes Relative: 15 %
Neutro Abs: 6.9 10*3/uL (ref 1.7–7.7)
Neutrophils Relative %: 60 %
Platelets: 365 10*3/uL (ref 150–400)
RBC: 4.02 MIL/uL — ABNORMAL LOW (ref 4.22–5.81)
RDW: 14.8 % (ref 11.5–15.5)
Smear Review: NORMAL
WBC: 11.6 10*3/uL — ABNORMAL HIGH (ref 4.0–10.5)
nRBC: 0 % (ref 0.0–0.2)

## 2022-02-10 LAB — LACTATE DEHYDROGENASE: LDH: 448 U/L — ABNORMAL HIGH (ref 98–192)

## 2022-02-11 ENCOUNTER — Encounter: Payer: Self-pay | Admitting: Urology

## 2022-02-11 ENCOUNTER — Ambulatory Visit (INDEPENDENT_AMBULATORY_CARE_PROVIDER_SITE_OTHER): Payer: 59 | Admitting: Urology

## 2022-02-11 VITALS — BP 114/65 | HR 67 | Ht 70.0 in | Wt 170.0 lb

## 2022-02-11 DIAGNOSIS — Z8546 Personal history of malignant neoplasm of prostate: Secondary | ICD-10-CM

## 2022-02-11 DIAGNOSIS — N401 Enlarged prostate with lower urinary tract symptoms: Secondary | ICD-10-CM | POA: Diagnosis not present

## 2022-02-11 DIAGNOSIS — C61 Malignant neoplasm of prostate: Secondary | ICD-10-CM

## 2022-02-11 NOTE — Progress Notes (Signed)
02/11/2022 8:39 AM   Anthony Graves August 31, 1960 [id number]  Referring provider: Jerrol Banana., MD [address] [place],  [place] [address]  Chief Complaint  Patient presents with   Elevated PSA    HPI: 61 y.o. male presents for follow-up  Has been seen here and [facility] for an elevated PSA. MRI at [facility] 07/2020 with a 38 cc gland and no PI-RADS 3 or greater lesions Elected to proceed with standard template biopsy which was performed at [facility] 03/2021.  Prostate volume 44 g Pathology 4 cores Gleason 3+3 (right mid, right base and left mid) Elected active surveillance States he is scheduled for prostate MRI and confirmatory biopsy at [facility] in November and wanted a second opinion regarding this recommendation Voiding pattern is stable on tamsulosin   PMH: Past Medical History:  Diagnosis Date   Arthritis    osteoartiritis   Basal cell carcinoma 10/12/2009   Left post. shoulder sup. lateral scapula, 3cm med. to hemangioma.    Basal cell carcinoma 02/26/2015   Right temple. Nodular.   Basal cell carcinoma 03/21/2019   Right inf. med. pectoral. Nodular. EDC   Basal cell carcinoma 03/21/2019   Left lat. abdomen parallel lat. to umbilicus. Micronodular. EDC.   Dysplastic nevus 01/28/2008   Left upper back medial sup scapula. Moderate to severe atypia, margins involved. Excised 03/04/2008, persistent DN, close to margin.   Dysplastic nevus 07/01/2010   Left low back paraspinal lat. Mild atypiua, margins involved.    Dysplastic nevus 07/01/2010   Left low back paraspinal me. Mild atypia, margins involved.    Gout    High platelet count    Prostate cancer ([place])    per pt    Surgical History: Past Surgical History:  Procedure Laterality Date   BONE MARROW BIOPSY     COLONOSCOPY  2016   CYSTECTOMY  years ago   pilonidal cyst removed   KNEE SURGERY Right 20 years ago   arthrocscopy   TOTAL KNEE ARTHROPLASTY Right 08/15/2016   Procedure: RIGHT TOTAL KNEE  ARTHROPLASTY;  Surgeon: Gaynelle Arabian, MD;  Location: [facility];  Service: Orthopedics;  Laterality: Right;    Home Medications:  Allergies as of 02/11/2022       Reactions   Penicillins    Reaction as a child Has patient had a PCN reaction causing immediate rash, facial/tongue/throat swelling, SOB or lightheadedness with hypotension:unsure Has patient had a PCN reaction causing severe rash involving mucus membranes or skin necrosis:unsure Has patient had a PCN reaction that required hospitalization:No Has patient had a PCN reaction occurring within the last 10 years: No If all of the above answers are "NO", then may proceed with Cephalosporin use.        Medication List        Accurate as of February 11, 2022  8:39 AM. If you have any questions, ask your nurse or doctor.          allopurinol 100 MG tablet Commonly known as: ZYLOPRIM Take 300 mg by mouth daily.   aspirin EC 81 MG tablet Take 1 tablet by mouth daily.   tamsulosin 0.4 MG Caps capsule Commonly known as: FLOMAX Take 1 capsule (0.4 mg total) by mouth daily.        Allergies:  Allergies  Allergen Reactions   Penicillins     Reaction as a child Has patient had a PCN reaction causing immediate rash, facial/tongue/throat swelling, SOB or lightheadedness with hypotension:unsure Has patient had a  PCN reaction causing severe rash involving mucus membranes or skin necrosis:unsure Has patient had a PCN reaction that required hospitalization:No Has patient had a PCN reaction occurring within the last 10 years: No If all of the above answers are "NO", then may proceed with Cephalosporin use.     Family History: History reviewed. No pertinent family history.  Social History:  reports that he has never smoked. He has never used smokeless tobacco. He reports current alcohol use of about 3.0 - 7.0 standard drinks of alcohol per week. He reports that he does not use drugs.   Physical Exam: BP 114/65   Pulse 67    Ht '5\' 10"'  (1.778 m)   Wt 170 lb (77.1 kg)   BMI 24.39 kg/m   Constitutional:  Alert and oriented, No acute distress. HEENT: [place] AT, moist mucus membranes.  Trachea midline, no masses. Cardiovascular: No clubbing, cyanosis, or edema. Respiratory: Normal respiratory effort, no increased work of breathing. GU: DRE last visit benign and deferred today with upcoming appointment with Dr. Lottie Rater Psychiatric: Normal mood and affect.   Assessment & Plan:    1.  T1c low risk prostate cancer On active surveillance We discussed the recommendation of confirmatory biopsy within 1-2 years of his original biopsy and I informed him I am in agreement with recommendation of follow-up MRI/biopsy Depending on the results of his confirmatory biopsy he may want to follow-up for PSA monitoring.  He will touch base by MyChart after biopsy performed  2.  BPH with LUTS Stable on tamsulosin   Abbie Sons, MD  [place] [address], [place], [place] [address] [phone]

## 2022-03-08 ENCOUNTER — Other Ambulatory Visit: Payer: Self-pay

## 2022-03-08 ENCOUNTER — Inpatient Hospital Stay: Payer: 59 | Attending: Internal Medicine

## 2022-03-08 DIAGNOSIS — D473 Essential (hemorrhagic) thrombocythemia: Secondary | ICD-10-CM

## 2022-03-08 LAB — CBC WITH DIFFERENTIAL/PLATELET
Abs Immature Granulocytes: 0.49 10*3/uL — ABNORMAL HIGH (ref 0.00–0.07)
Basophils Absolute: 0.1 10*3/uL (ref 0.0–0.1)
Basophils Relative: 1 %
Eosinophils Absolute: 0.1 10*3/uL (ref 0.0–0.5)
Eosinophils Relative: 1 %
HCT: 41.3 % (ref 39.0–52.0)
Hemoglobin: 13.1 g/dL (ref 13.0–17.0)
Immature Granulocytes: 4 %
Lymphocytes Relative: 18 %
Lymphs Abs: 2.2 10*3/uL (ref 0.7–4.0)
MCH: 32 pg (ref 26.0–34.0)
MCHC: 31.7 g/dL (ref 30.0–36.0)
MCV: 100.7 fL — ABNORMAL HIGH (ref 80.0–100.0)
Monocytes Absolute: 1.8 10*3/uL — ABNORMAL HIGH (ref 0.1–1.0)
Monocytes Relative: 15 %
Neutro Abs: 7.2 10*3/uL (ref 1.7–7.7)
Neutrophils Relative %: 61 %
Platelets: 356 10*3/uL (ref 150–400)
RBC: 4.1 MIL/uL — ABNORMAL LOW (ref 4.22–5.81)
RDW: 14.6 % (ref 11.5–15.5)
Smear Review: ADEQUATE
WBC: 11.9 10*3/uL — ABNORMAL HIGH (ref 4.0–10.5)
nRBC: 0.2 % (ref 0.0–0.2)

## 2022-03-08 LAB — LACTATE DEHYDROGENASE: LDH: 463 U/L — ABNORMAL HIGH (ref 98–192)

## 2022-03-08 LAB — URIC ACID: Uric Acid, Serum: 5.2 mg/dL (ref 3.7–8.6)

## 2022-03-17 ENCOUNTER — Ambulatory Visit (INDEPENDENT_AMBULATORY_CARE_PROVIDER_SITE_OTHER): Payer: 59 | Admitting: Sports Medicine

## 2022-03-17 VITALS — BP 119/58 | Ht 70.0 in | Wt 170.0 lb

## 2022-03-17 DIAGNOSIS — M79675 Pain in left toe(s): Secondary | ICD-10-CM | POA: Diagnosis not present

## 2022-03-17 NOTE — Progress Notes (Signed)
PCP: Anthony Graves., MD  Subjective:   HPI: Patient is a 61 y.o. male here for left foot pain.  Anthony Graves has a history of gout, initially diagnosed 16 months ago. He has had a few flares in his big toe requiring treatment with Prednisone, which resolved the acute pain.  He presents today with persistent pain in his left MTP joint. 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 Also worse initially with exercise- he uses the elliptical and works with a trainer a couple times a week.    Past Medical History:  Diagnosis Date   Arthritis    osteoartiritis   Basal cell carcinoma 10/12/2009   Left post. shoulder sup. lateral scapula, 3cm med. to hemangioma.    Basal cell carcinoma 02/26/2015   Right temple. Nodular.   Basal cell carcinoma 03/21/2019   Right inf. med. pectoral. Nodular. EDC   Basal cell carcinoma 03/21/2019   Left lat. abdomen parallel lat. to umbilicus. Micronodular. EDC.   Dysplastic nevus 01/28/2008   Left upper back medial sup scapula. Moderate to severe atypia, margins involved. Excised 03/04/2008, persistent DN, close to margin.   Dysplastic nevus 07/01/2010   Left low back paraspinal lat. Mild atypiua, margins involved.    Dysplastic nevus 07/01/2010   Left low back paraspinal me. Mild atypia, margins involved.    Gout    High platelet count    Prostate cancer (Anthony Graves)    per pt    Current Outpatient Medications on File Prior to Visit  Medication Sig Dispense Refill   allopurinol (ZYLOPRIM) 100 MG tablet Take 300 mg by mouth daily.     aspirin EC 81 MG tablet Take 1 tablet by mouth daily.     tamsulosin (FLOMAX) 0.4 MG CAPS capsule Take 1 capsule (0.4 mg total) by mouth daily. 90 capsule 3   No current facility-administered medications on file prior to visit.    Past Surgical History:  Procedure Laterality Date   BONE MARROW BIOPSY     COLONOSCOPY  2016   CYSTECTOMY  years ago   pilonidal cyst removed   KNEE SURGERY  Right 20 years ago   arthrocscopy   TOTAL KNEE ARTHROPLASTY Right 08/15/2016   Procedure: RIGHT TOTAL KNEE ARTHROPLASTY;  Surgeon: Anthony Arabian, MD;  Location: [facility];  Service: Orthopedics;  Laterality: Right;    Allergies  Allergen Reactions   Penicillins     Reaction as a child Has patient had a PCN reaction causing immediate rash, facial/tongue/throat swelling, SOB or lightheadedness with hypotension:unsure Has patient had a PCN reaction causing severe rash involving mucus membranes or skin necrosis:unsure Has patient had a PCN reaction that required hospitalization:No Has patient had a PCN reaction occurring within the last 10 years: No If all of the above answers are "NO", then may proceed with Cephalosporin use.     BP (!) 119/58   Ht _0  (1.778 m)   Wt 170 lb (77.1 kg)   BMI 24.39 kg/m       No data to display              No data to display              Objective:  Physical Exam:  Gen: NAD, comfortable in exam room  Left foot: No obvious swelling or bruising. Mild erythema over MTP joint.  Hallux valgus L >R. No hallux rigidus- FROM in big toe. Sensation intact. Collapsed transverse arch with standing with  clawing of the toes.  Right foot: No swelling, bruising, erythema. No TTP. Minor hallux valgus. FROM in big toe. Sensation intact. Collapsed transverse arch with standing with clawing of the toes.  Ultrasound left foot/big toe: No osteoarthritic changes or joint effusion. No soft tissue swelling. No bone spurs. No findings suggestive of acute gout. Sesamoids visualized without inflammation/fracture.   Assessment & Plan:  1. Bunion and collapsed transverse arch bilaterally No obvious signs of gout flare today. Irritability of right bunion with narrow toe box shoes likely contributing to discomfort. Fit for metatarsal pads (cookies felt too tight/less tolerable) with insoles. Will see how he does with these for 4 weeks and monitor for improvement     Patient seen and evaluated with the resident.  I agree with the above plan of care.  Anthony Graves great toe pain is likely a result of transverse arch collapse.  Tells me that his father actually had collapse severe enough that he had fat pad atrophy underneath his metatarsal heads.  Although Anthony Graves does not exhibit transverse arch collapse that severe, I do think we should try either a metatarsal pad or metatarsal cookie to help give his transverse arch a little bit more support.  He will continue to wear shoes that have a wide toe box.  Ultrasound evaluation of the first MTP joint was fairly unremarkable.  No signs of gout or significant degenerative changes.  Follow-up with me again in 4 weeks to see how he is doing with these simple shoe modifications.  This note was dictated using Dragon naturally speaking software and may contain errors in syntax, spelling, or content which have not been identified prior to signing this note.

## 2022-03-17 NOTE — Patient Instructions (Signed)
It was great to meet you today.  Your left toe pain is likely due to bunion formation. Wear the metatarsal toe pads in your shoes for the next few weeks to see if they help with the pain

## 2022-04-08 ENCOUNTER — Inpatient Hospital Stay: Payer: 59 | Attending: Internal Medicine

## 2022-04-08 DIAGNOSIS — D473 Essential (hemorrhagic) thrombocythemia: Secondary | ICD-10-CM | POA: Insufficient documentation

## 2022-04-08 LAB — CBC WITH DIFFERENTIAL/PLATELET
Abs Immature Granulocytes: 0.57 10*3/uL — ABNORMAL HIGH (ref 0.00–0.07)
Basophils Absolute: 0.1 10*3/uL (ref 0.0–0.1)
Basophils Relative: 1 %
Eosinophils Absolute: 0.1 10*3/uL (ref 0.0–0.5)
Eosinophils Relative: 0 %
HCT: 39.3 % (ref 39.0–52.0)
Hemoglobin: 12.6 g/dL — ABNORMAL LOW (ref 13.0–17.0)
Immature Granulocytes: 5 %
Lymphocytes Relative: 17 %
Lymphs Abs: 2.1 10*3/uL (ref 0.7–4.0)
MCH: 31.8 pg (ref 26.0–34.0)
MCHC: 32.1 g/dL (ref 30.0–36.0)
MCV: 99.2 fL (ref 80.0–100.0)
Monocytes Absolute: 1.8 10*3/uL — ABNORMAL HIGH (ref 0.1–1.0)
Monocytes Relative: 14 %
Neutro Abs: 8.1 10*3/uL — ABNORMAL HIGH (ref 1.7–7.7)
Neutrophils Relative %: 63 %
Platelets: 368 10*3/uL (ref 150–400)
RBC: 3.96 MIL/uL — ABNORMAL LOW (ref 4.22–5.81)
RDW: 15 % (ref 11.5–15.5)
Smear Review: ADEQUATE
WBC: 12.7 10*3/uL — ABNORMAL HIGH (ref 4.0–10.5)
nRBC: 0.3 % — ABNORMAL HIGH (ref 0.0–0.2)

## 2022-04-08 LAB — LACTATE DEHYDROGENASE: LDH: 502 U/L — ABNORMAL HIGH (ref 98–192)

## 2022-04-14 ENCOUNTER — Ambulatory Visit (INDEPENDENT_AMBULATORY_CARE_PROVIDER_SITE_OTHER): Payer: 59 | Admitting: Sports Medicine

## 2022-04-14 DIAGNOSIS — M79675 Pain in left toe(s): Secondary | ICD-10-CM

## 2022-04-14 NOTE — Progress Notes (Signed)
  Anthony Graves - 61 y.o. male MRN [id number]  Date of birth: 05/15/1961    CHIEF COMPLAINT:       SUBJECTIVE:   HPI:  Left great toe pain feels better for walking around but still hurts with heavy activity (olliptical, golf, heavy day of yard work) hasn't tried running. Feels pain at the base of great toe, appreciates tenderness there but not same pain with activity/use. Wears pad with athletic shoes but not with dress shoes. Works out in am with athletic shoes, but then wears dress shoes to work where he mostly sits, with no pad. Comes home and switches back into athletic shoes with pad, after work.   Reports he was using the insert in right shoe as well, but it caused his right knee to swell. Got it evaluated at and had no issues, so patient took insert out, and swelling went down in 3-4 days.   ROS:     See HPI   OBJECTIVE: BP (!) 114/50   Ht '5\' 10"'$  (1.778 m)   BMI 24.39 kg/m   Physical Exam:  Vital signs are reviewed.  GEN: Alert and oriented, NAD Pulm: Breathing unlabored PSY: normal mood, congruent affect  MSK: Left Great Toe: No misalignment, ecchymosis, or erythema, FROM, TTP at lateral base of MTP. Collapsed transverse arch and clawing of toes w/ standing   ASSESSMENT & PLAN:  1. Bunion  and Collapsed Transverse Arch Patient reports metatarsal cookie is helping with toe pain, but still complains of some pain at lateral border at the base of MTP. Patient may benefit from bunion sock, to provide more padding to that area. Will encourage patient to use metatarsal cookie in other shoes and to wear if he is going to be on his feet.    Anthony Bouche, MD PGY-2, [facility] Resident Anthony Graves  Patient seen and evaluated with the resident.  I agree with the above plan of care.  Anthony Graves's symptoms are improving but have not resolved.  I think we should stick with a metatarsal cookie and I also recommended that he consider purchasing a bunion sock to help pad the  area little better.  Continue with activity as tolerated and follow-up with me as needed.

## 2022-04-19 ENCOUNTER — Encounter: Payer: PRIVATE HEALTH INSURANCE | Admitting: Family Medicine

## 2022-05-12 ENCOUNTER — Inpatient Hospital Stay: Payer: 59 | Attending: Internal Medicine

## 2022-05-12 ENCOUNTER — Ambulatory Visit: Payer: 59 | Admitting: Internal Medicine

## 2022-05-12 ENCOUNTER — Other Ambulatory Visit: Payer: 59

## 2022-05-12 ENCOUNTER — Inpatient Hospital Stay (HOSPITAL_BASED_OUTPATIENT_CLINIC_OR_DEPARTMENT_OTHER): Payer: 59 | Admitting: Nurse Practitioner

## 2022-05-12 ENCOUNTER — Encounter: Payer: Self-pay | Admitting: Nurse Practitioner

## 2022-05-12 VITALS — BP 128/71 | HR 54 | Temp 96.6°F | Resp 20 | Wt 172.8 lb

## 2022-05-12 DIAGNOSIS — M109 Gout, unspecified: Secondary | ICD-10-CM | POA: Diagnosis not present

## 2022-05-12 DIAGNOSIS — D473 Essential (hemorrhagic) thrombocythemia: Secondary | ICD-10-CM | POA: Insufficient documentation

## 2022-05-12 DIAGNOSIS — Z7982 Long term (current) use of aspirin: Secondary | ICD-10-CM | POA: Diagnosis not present

## 2022-05-12 LAB — COMPREHENSIVE METABOLIC PANEL
ALT: 16 U/L (ref 0–44)
AST: 25 U/L (ref 15–41)
Albumin: 4.1 g/dL (ref 3.5–5.0)
Alkaline Phosphatase: 49 U/L (ref 38–126)
Anion gap: 6 (ref 5–15)
BUN: 21 mg/dL (ref 8–23)
CO2: 28 mmol/L (ref 22–32)
Calcium: 8.9 mg/dL (ref 8.9–10.3)
Chloride: 105 mmol/L (ref 98–111)
Creatinine, Ser: 0.95 mg/dL (ref 0.61–1.24)
GFR, Estimated: >60 mL/min (ref >60)
Glucose, Bld: 104 mg/dL — ABNORMAL HIGH (ref 70–99)
Potassium: 4.7 mmol/L (ref 3.5–5.1)
Sodium: 139 mmol/L (ref 135–145)
Total Bilirubin: 0.5 mg/dL (ref 0.3–1.2)
Total Protein: 6.9 g/dL (ref 6.5–8.1)

## 2022-05-12 LAB — CBC WITH DIFFERENTIAL/PLATELET
Abs Immature Granulocytes: 0.4 10*3/uL — ABNORMAL HIGH (ref 0.00–0.07)
Basophils Absolute: 0 10*3/uL (ref 0.0–0.1)
Basophils Relative: 0 %
Eosinophils Absolute: 0 10*3/uL (ref 0.0–0.5)
Eosinophils Relative: 0 %
HCT: 42.6 % (ref 39.0–52.0)
Hemoglobin: 13.3 g/dL (ref 13.0–17.0)
Lymphocytes Relative: 16 %
Lymphs Abs: 1.9 10*3/uL (ref 0.7–4.0)
MCH: 31.6 pg (ref 26.0–34.0)
MCHC: 31.2 g/dL (ref 30.0–36.0)
MCV: 101.2 fL — ABNORMAL HIGH (ref 80.0–100.0)
Metamyelocytes Relative: 1 %
Monocytes Absolute: 0.4 10*3/uL (ref 0.1–1.0)
Monocytes Relative: 3 %
Myelocytes: 2 %
Neutro Abs: 9.3 10*3/uL — ABNORMAL HIGH (ref 1.7–7.7)
Neutrophils Relative %: 78 %
Platelets: 408 10*3/uL — ABNORMAL HIGH (ref 150–400)
RBC: 4.21 MIL/uL — ABNORMAL LOW (ref 4.22–5.81)
RDW: 15 % (ref 11.5–15.5)
Smear Review: NORMAL
WBC: 11.9 10*3/uL — ABNORMAL HIGH (ref 4.0–10.5)
nRBC: 0.3 % — ABNORMAL HIGH (ref 0.0–0.2)

## 2022-05-12 LAB — LACTATE DEHYDROGENASE: LDH: 477 U/L — ABNORMAL HIGH (ref 98–192)

## 2022-05-12 LAB — URIC ACID: Uric Acid, Serum: 5.8 mg/dL (ref 3.7–8.6)

## 2022-05-12 NOTE — Progress Notes (Signed)
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  Patient continues to be asymptomatic.  LDH slightly elevated.    # MAY 2023-second opinion-[facility] Dr. REvelene Croon DISCONTINUED anagrelide [secondary to concern for bone marrow fibrosis]; rising LDH; on aspirin only   DIAGNOSIS: ET    Essential thrombocytosis ([place]   INTERVAL HISTORY: Ambulating independently.  Alone.  Patient 89very pleasant male who returns to clinic for follow-up of essential thrombocytosis, CALR mutation, previously on anagrelide, held due to concerns for fibrosis in the bone marrow.  Currently on aspirin February, who returns to clinic for follow-up and discussion of lab results.  He feels well and denies complaints.  Continues to be followed by sports medicine for foot/toe pain. Continues allopurinol for gout.  No shortness of breath or cough.  No fatigue.  Weight is stable.  He [facility].  Review of Systems  Constitutional:  Negative for chills, diaphoresis, fever, malaise/fatigue and weight loss.  HENT:  Negative for nosebleeds and sore throat.   Eyes:  Negative for double vision.  Respiratory:   Negative for cough, hemoptysis, sputum production, shortness of breath and wheezing.   Cardiovascular:  Negative for chest pain, palpitations, orthopnea and leg swelling.  Gastrointestinal:  Negative for abdominal pain, blood in stool, constipation, diarrhea, heartburn, melena, nausea and vomiting.  Genitourinary:  Negative for dysuria, frequency and urgency.  Musculoskeletal:  Negative for back pain and joint pain.  Skin: Negative.  Negative for itching and rash.  Neurological:  Negative for dizziness, tingling, focal weakness, weakness and headaches.  Endo/Heme/Allergies:  Does not bruise/bleed easily.  Psychiatric/Behavioral:  Negative for depression. The patient is not nervous/anxious and does not have insomnia.    PAST MEDICAL HISTORY :  Past Medical History:  Diagnosis Date   Arthritis    osteoartiritis   Basal cell carcinoma 10/12/2009   Left post. shoulder sup. lateral scapula, 3cm med. to hemangioma.    Basal cell carcinoma 02/26/2015   Right temple. Nodular.   Basal cell carcinoma 03/21/2019   Right inf. med. pectoral. Nodular. EDC   Basal cell carcinoma 03/21/2019   Left lat. abdomen parallel lat. to umbilicus. Micronodular. EDC.   Dysplastic nevus 01/28/2008   Left upper back medial sup scapula. Moderate to severe atypia, margins involved. Excised 03/04/2008, persistent DN, close to margin.   Dysplastic nevus 07/01/2010   Left low back paraspinal lat. Mild atypiua, margins involved.    Dysplastic nevus 07/01/2010   Left low back paraspinal me. Mild atypia, margins involved.    Gout    High platelet count    Prostate cancer ([place]    per pt    PAST SURGICAL HISTORY :   Past Surgical History:  Procedure Laterality Date   BONE MARROW BIOPSY     COLONOSCOPY  2016   CYSTECTOMY  years ago  pilonidal cyst removed   KNEE SURGERY Right 20 years ago   arthrocscopy   TOTAL KNEE ARTHROPLASTY Right 08/15/2016   Procedure: RIGHT TOTAL KNEE ARTHROPLASTY;  Surgeon: Gaynelle Arabian, MD;  Location: [facility];  Service: Orthopedics;  Laterality: Right;   FAMILY HISTORY :  History reviewed. No pertinent family history.  SOCIAL HISTORY:   Social History   Tobacco Use   Smoking status: Never   Smokeless tobacco: Never  Vaping Use   Vaping Use: Never used  Substance Use Topics   Alcohol use: Yes    Alcohol/week: 3.0 - 7.0 standard drinks of alcohol    Types: 3 - 7 Glasses of wine per week   Drug use: No    ALLERGIES:  is allergic to penicillins.  MEDICATIONS:  Current Outpatient Medications  Medication Sig Dispense Refill   allopurinol (ZYLOPRIM) 100 MG tablet Take 300 mg by mouth daily.     aspirin EC 81 MG tablet Take 1 tablet by mouth daily.     tamsulosin (FLOMAX) 0.4 MG CAPS capsule Take 1 capsule (0.4 mg total) by mouth daily. 90 capsule 3   No current facility-administered medications for this visit.    PHYSICAL EXAMINATION: ECOG PERFORMANCE STATUS: 0 - Asymptomatic  BP 128/71   Pulse (!) 54   Temp (!) 96.6 F (35.9 C)   Resp 20   Wt 172 lb 12.8 oz (78.4 kg)   SpO2 100%   BMI 24.79 kg/m   Filed Weights   05/12/22 1006  Weight: 172 lb 12.8 oz (78.4 kg)    Physical Exam Constitutional:      Appearance: He is not ill-appearing.  Eyes:     General: No scleral icterus.    Conjunctiva/sclera: Conjunctivae normal.  Cardiovascular:     Rate and Rhythm: Normal rate and regular rhythm.  Abdominal:     General: There is no distension.     Palpations: Abdomen is soft.     Tenderness: There is no abdominal tenderness. There is no guarding.  Musculoskeletal:        General: No deformity.     Right lower leg: No edema.     Left lower leg: No edema.  Lymphadenopathy:     Cervical: No cervical adenopathy.  Skin:    General: Skin is warm and dry.  Neurological:     Mental Status: He is alert and oriented to person, place, and time. Mental status is at baseline.  Psychiatric:        Mood and Affect: Mood normal.        Behavior:  Behavior normal.     LABORATORY DATA:  I have reviewed the data as listed    Component Value Date/Time   NA 139 05/12/2022 0954   NA 143 07/29/2016 0844   K 4.7 05/12/2022 0954   CL 105 05/12/2022 0954   CO2 28 05/12/2022 0954   GLUCOSE 104 (H) 05/12/2022 0954   BUN 21 05/12/2022 0954   BUN 20 07/29/2016 0844   CREATININE 0.95 05/12/2022 0954   CREATININE 1.10 04/25/2014 0823   CALCIUM 8.9 05/12/2022 0954   PROT 6.9 05/12/2022 0954   PROT 6.7 07/29/2016 0844   PROT 6.7 04/25/2014 0823   ALBUMIN 4.1 05/12/2022 0954   ALBUMIN 4.5 07/29/2016 0844   ALBUMIN 3.8 04/25/2014 0823   AST 25 05/12/2022 0954   AST 22 04/25/2014 0823   ALT 16 05/12/2022 0954   ALT 26 04/25/2014 0823   ALKPHOS 49 05/12/2022 0954  ALKPHOS 44 (L) 04/25/2014 0823   BILITOT 0.5 05/12/2022 0954   BILITOT 0.6 07/29/2016 0844   BILITOT 0.6 04/25/2014 0823   GFRNONAA >60 05/12/2022 0954   GFRNONAA >60 04/25/2014 0823   GFRNONAA >60 02/04/2014 0830   GFRAA >60 12/25/2019 1324   GFRAA >60 04/25/2014 0823   GFRAA >60 02/04/2014 0830   No results found for: "SPEP", "UPEP"  Lab Results  Component Value Date   WBC 11.9 (H) 05/12/2022   NEUTROABS PENDING 05/12/2022   HGB 13.3 05/12/2022   HCT 42.6 05/12/2022   MCV 101.2 (H) 05/12/2022   PLT 408 (H) 05/12/2022    ASSESSMENT & PLAN:   No problem-specific Assessment & Plan notes found for this encounter.  Essential thrombocytosis ([place]) # Essential thrombocytosis-LOW risk; [positive for CALR mutation]; however hemoglobin is 12; white count 11-12. FEB 2023-bone marrow biopsy shows 80% hypercellularity; with fibrosis grade 2; no evidence of any blasts or concerns for progression obvious myelofibrosis.   # Labs today reviewed and compared to past few months. Discussed in detail with patient. Currently off anagrelide d/t concerns of fibrosis from anagrelide. Reviewed labs today. Plts, LDH are stable. Continue holding anagrelide. Again reviewed rationale and  what would warrant restarting vs changing treatments. He will see Dr Evelene Croon for follow up in 2 weeks. Will continue checking cbc, ldh monthly and have him follow up with Dr. Rogue Bussing in 3 months or sooner if Dr Evelene Croon recommends.     # History of Gout- left big toe- allopurinol. Uric acid levels normal-continue follow-up with PCP; STABLE.  # Elevated PSA/prostate cancer ~ [Dr.Stoiff]- MRI/[facility]- currently on surveillance.    # DISPOSITION:  1 month- lab (cbc, ldh) 2 month- lab (cbc, ldh) 3 month- lab (cbc, cmp, ldh, uric acid), Dr Rogue Bussing- [place]    Verlon Au, NP 05/12/2022 10:33 AM

## 2022-05-24 ENCOUNTER — Telehealth: Payer: Self-pay | Admitting: *Deleted

## 2022-05-24 NOTE — Telephone Encounter (Signed)
Dr Evelene Croon called and wanted Dr B to know that this patient is going to be on Pegasys and that she will be following him for the first several months and she will let Dr B know when he is to take over management of this patient. She left her cell number to call if Dr B has any questions or concerns

## 2022-05-30 ENCOUNTER — Other Ambulatory Visit: Payer: Self-pay | Admitting: Urology

## 2022-06-07 ENCOUNTER — Encounter: Payer: Self-pay | Admitting: Internal Medicine

## 2022-06-13 ENCOUNTER — Inpatient Hospital Stay: Payer: 59

## 2022-06-14 ENCOUNTER — Inpatient Hospital Stay: Payer: 59

## 2022-06-15 ENCOUNTER — Ambulatory Visit (INDEPENDENT_AMBULATORY_CARE_PROVIDER_SITE_OTHER): Payer: 59

## 2022-06-15 ENCOUNTER — Inpatient Hospital Stay: Payer: 59 | Attending: Internal Medicine

## 2022-06-15 ENCOUNTER — Ambulatory Visit (INDEPENDENT_AMBULATORY_CARE_PROVIDER_SITE_OTHER): Payer: 59 | Admitting: Podiatry

## 2022-06-15 DIAGNOSIS — M25872 Other specified joint disorders, left ankle and foot: Secondary | ICD-10-CM | POA: Diagnosis not present

## 2022-06-15 DIAGNOSIS — D473 Essential (hemorrhagic) thrombocythemia: Secondary | ICD-10-CM | POA: Diagnosis not present

## 2022-06-15 LAB — CBC WITH DIFFERENTIAL/PLATELET
Abs Immature Granulocytes: 0.39 10*3/uL — ABNORMAL HIGH (ref 0.00–0.07)
Basophils Absolute: 0.1 10*3/uL (ref 0.0–0.1)
Basophils Relative: 1 %
Eosinophils Absolute: 0 10*3/uL (ref 0.0–0.5)
Eosinophils Relative: 0 %
HCT: 43.2 % (ref 39.0–52.0)
Hemoglobin: 13.7 g/dL (ref 13.0–17.0)
Immature Granulocytes: 3 %
Lymphocytes Relative: 17 %
Lymphs Abs: 1.9 10*3/uL (ref 0.7–4.0)
MCH: 31.9 pg (ref 26.0–34.0)
MCHC: 31.7 g/dL (ref 30.0–36.0)
MCV: 100.5 fL — ABNORMAL HIGH (ref 80.0–100.0)
Monocytes Absolute: 1.7 10*3/uL — ABNORMAL HIGH (ref 0.1–1.0)
Monocytes Relative: 15 %
Neutro Abs: 7.1 10*3/uL (ref 1.7–7.7)
Neutrophils Relative %: 64 %
Platelets: 349 10*3/uL (ref 150–400)
RBC: 4.3 MIL/uL (ref 4.22–5.81)
RDW: 15.3 % (ref 11.5–15.5)
Smear Review: ADEQUATE
WBC: 11.3 10*3/uL — ABNORMAL HIGH (ref 4.0–10.5)
nRBC: 0.5 % — ABNORMAL HIGH (ref 0.0–0.2)

## 2022-06-15 LAB — LACTATE DEHYDROGENASE: LDH: 473 U/L — ABNORMAL HIGH (ref 98–192)

## 2022-06-15 MED ORDER — BETAMETHASONE SOD PHOS & ACET 6 (3-3) MG/ML IJ SUSP
3.0000 mg | Freq: Once | INTRAMUSCULAR | Status: AC
  Administered 2022-06-15: 3 mg via INTRA_ARTICULAR

## 2022-06-15 NOTE — Progress Notes (Signed)
Chief Complaint  Patient presents with   Foot Pain    Left foot pain off and on for a year pt stated that it started with gout and it just never got better     HPI: 61 y.o. male presenting today as a new patient for evaluation of pain and tenderness associated to the plantar aspect of the first MTP left foot.  Patient states that he had an acute episode of gout to the left great toe joint approximately 1 year ago.  He had never had gout prior to this episode.  Ever since that time he has had pain and tenderness to the left great toe joint.    He went to an orthopedist who recommended arch supports because of suppose it "fallen arches" and applied felt padding to the arches of the bilateral feet.  Patient states that this aggravated his knee replacement to the right lower extremity and so he no longer wears arch supports to the right foot.  The arch support to the left foot does help alleviate some of the pain to the first MTP joint.  Past Medical History:  Diagnosis Date   Arthritis    osteoartiritis   Basal cell carcinoma 10/12/2009   Left post. shoulder sup. lateral scapula, 3cm med. to hemangioma.    Basal cell carcinoma 02/26/2015   Right temple. Nodular.   Basal cell carcinoma 03/21/2019   Right inf. med. pectoral. Nodular. EDC   Basal cell carcinoma 03/21/2019   Left lat. abdomen parallel lat. to umbilicus. Micronodular. EDC.   Dysplastic nevus 01/28/2008   Left upper back medial sup scapula. Moderate to severe atypia, margins involved. Excised 03/04/2008, persistent DN, close to margin.   Dysplastic nevus 07/01/2010   Left low back paraspinal lat. Mild atypiua, margins involved.    Dysplastic nevus 07/01/2010   Left low back paraspinal me. Mild atypia, margins involved.    Gout    High platelet count    Prostate cancer ([place])    per pt    Past Surgical History:  Procedure Laterality Date   BONE MARROW BIOPSY     COLONOSCOPY  2016   CYSTECTOMY  years ago   pilonidal  cyst removed   KNEE SURGERY Right 20 years ago   arthrocscopy   TOTAL KNEE ARTHROPLASTY Right 08/15/2016   Procedure: RIGHT TOTAL KNEE ARTHROPLASTY;  Surgeon: Gaynelle Arabian, MD;  Location: [facility];  Service: Orthopedics;  Laterality: Right;    Allergies  Allergen Reactions   Penicillins     Reaction as a child Has patient had a PCN reaction causing immediate rash, facial/tongue/throat swelling, SOB or lightheadedness with hypotension:unsure Has patient had a PCN reaction causing severe rash involving mucus membranes or skin necrosis:unsure Has patient had a PCN reaction that required hospitalization:No Has patient had a PCN reaction occurring within the last 10 years: No If all of the above answers are "NO", then may proceed with Cephalosporin use.      Physical Exam: General: The patient is alert and oriented x3 in no acute distress.  Dermatology: Skin is warm, dry and supple bilateral lower extremities. Negative for open lesions or macerations.  Vascular: Palpable pedal pulses bilaterally. Capillary refill within normal limits.  Negative for any significant edema or erythema  Neurological: Light touch and protective threshold grossly intact  Musculoskeletal Exam: No pedal deformities noted.  No fallen arches noted.  Patient maintains medial longitudinal arch with weightbearing.  There is pain with palpation to the tibial sesamoid of the  left foot.  No pain with palpation of the joint itself or with range of motion of the joint  Radiographic Exam LT foot 06/15/2022:  Normal osseous mineralization. Joint spaces preserved. No fracture/dislocation/boney destruction.    Assessment: 1.  Tibial sesamoiditis left   Plan of Care:  1. Patient evaluated. X-Rays reviewed.  2.  Discussed the anatomy and pathology of tibial sesamoiditis 3.  Injection of 0.5 cc Celestone Soluspan injected in the sesamoidal apparatus left 4.  Postsurgical shoe dispensed to immobilize the great toe joint 5  recommend modifying exercises that do not increase pressure to the forefoot such as an elliptical.  Recommend bicycle for cardio instead 6.  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 Edrick Kins, DPM    [address], [place] [address]                Office [phone]  Fax [phone]

## 2022-06-28 ENCOUNTER — Ambulatory Visit
Admission: RE | Admit: 2022-06-28 | Discharge: 2022-06-28 | Disposition: A | Discharge status: Home / Self Care | Payer: 59 | Attending: Family Medicine | Admitting: Family Medicine

## 2022-06-28 ENCOUNTER — Encounter: Payer: Self-pay | Admitting: *Deleted

## 2022-06-28 ENCOUNTER — Ambulatory Visit
Admission: RE | Admit: 2022-06-28 | Discharge: 2022-06-28 | Disposition: A | Discharge status: Home / Self Care | Payer: 59 | Source: Ambulatory Visit | Attending: Family Medicine | Admitting: Family Medicine

## 2022-06-28 ENCOUNTER — Encounter: Payer: Self-pay | Admitting: Family Medicine

## 2022-06-28 ENCOUNTER — Ambulatory Visit (INDEPENDENT_AMBULATORY_CARE_PROVIDER_SITE_OTHER): Payer: 59 | Admitting: Family Medicine

## 2022-06-28 VITALS — BP 132/71 | HR 73 | Temp 99.1°F | Resp 16 | Wt 172.0 lb

## 2022-06-28 DIAGNOSIS — R051 Acute cough: Secondary | ICD-10-CM

## 2022-06-28 DIAGNOSIS — R509 Fever, unspecified: Secondary | ICD-10-CM

## 2022-06-28 LAB — POCT INFLUENZA A/B
Influenza A, POC: NEGATIVE
Influenza B, POC: NEGATIVE

## 2022-06-28 LAB — POC COVID19 BINAXNOW: SARS Coronavirus 2 Ag: NEGATIVE

## 2022-06-28 NOTE — Progress Notes (Signed)
     I,April Miller,acting as a scribe for Lelon Huh, MD.,have documented all relevant documentation on the behalf of Lelon Huh, MD,as directed by  Lelon Huh, MD while in the presence of Lelon Huh, MD.   Established patient visit   Patient: Anthony Graves   DOB: 1960/08/13   61 y.o. Male  MRN: [id number] Visit Date: 06/28/2022  Today's healthcare provider: Lelon Huh, MD   Chief Complaint  Patient presents with   Cough   Fever   Subjective    HPI  Patient states he began having a cough yesterday. Patient also has sweats and sinus congestion. Cough feels likes its coming from chest, is non-productive. Having no dyspnea. Patient took 2 Advil for fever twice since yesterday. On cefdinir last 10 days for infection after prostate biopsy.  Medications: Outpatient Medications Prior to Visit  Medication Sig   allopurinol (ZYLOPRIM) 100 MG tablet Take 300 mg by mouth daily.   aspirin EC 81 MG tablet Take 1 tablet by mouth daily.   cefdinir (OMNICEF) 300 MG capsule Take 300 mg by mouth 2 (two) times daily.   tamsulosin (FLOMAX) 0.4 MG CAPS capsule TAKE 1 CAPSULE BY MOUTH EVERY DAY   No facility-administered medications prior to visit.    Review of Systems  Constitutional:  Positive for fever. Negative for appetite change and chills.  Respiratory:  Negative for chest tightness, shortness of breath and wheezing.   Cardiovascular:  Negative for chest pain and palpitations.  Gastrointestinal:  Negative for abdominal pain, nausea and vomiting.       Objective    BP 132/71 (BP Location: Left Arm, Patient Position: Sitting, Cuff Size: Normal)   Pulse 73   Temp 99.1 F (37.3 C) (Oral)   Resp 16   Wt 172 lb (78 kg)   SpO2 99%   BMI 24.68 kg/m    Physical Exam  General Appearance:    Well developed, well nourished male, alert, cooperative, in no acute distress  HENT:   bilateral TM normal without fluid or infection, neck without nodes, sinuses nontender, and nasal  mucosa congested  Eyes:    PERRL, conjunctiva/corneas clear, EOM's intact       Lungs:     Clear to auscultation bilaterally, respirations unlabored  Heart:    Normal heart rate. Normal rhythm. No murmurs, rubs, or gallops.    Neurologic:   Awake, alert, oriented x 3. No apparent focal neurological           defect.      Covid - Negative Flu A/B - Negative  Assessment & Plan     1. Acute cough  2. Fever, unspecified fever cause  - DG Chest 2 View; Future  Discussed supportive treatment of symptoms.       The entirety of the information documented in the History of Present Illness, Review of Systems and Physical Exam were personally obtained by me. Portions of this information were initially documented by the CMA and reviewed by me for thoroughness and accuracy.     Lelon Huh, MD  [facility] [phone] (phone) [phone] (fax)  [place]

## 2022-07-12 ENCOUNTER — Inpatient Hospital Stay: Payer: 59 | Attending: Internal Medicine

## 2022-07-12 DIAGNOSIS — D473 Essential (hemorrhagic) thrombocythemia: Secondary | ICD-10-CM | POA: Diagnosis present

## 2022-07-12 LAB — CBC WITH DIFFERENTIAL/PLATELET
Abs Immature Granulocytes: 0.44 10*3/uL — ABNORMAL HIGH (ref 0.00–0.07)
Basophils Absolute: 0.1 10*3/uL (ref 0.0–0.1)
Basophils Relative: 1 %
Eosinophils Absolute: 0 10*3/uL (ref 0.0–0.5)
Eosinophils Relative: 0 %
HCT: 36.6 % — ABNORMAL LOW (ref 39.0–52.0)
Hemoglobin: 11.4 g/dL — ABNORMAL LOW (ref 13.0–17.0)
Immature Granulocytes: 4 %
Lymphocytes Relative: 19 %
Lymphs Abs: 1.9 10*3/uL (ref 0.7–4.0)
MCH: 31.3 pg (ref 26.0–34.0)
MCHC: 31.1 g/dL (ref 30.0–36.0)
MCV: 100.5 fL — ABNORMAL HIGH (ref 80.0–100.0)
Monocytes Absolute: 1.6 10*3/uL — ABNORMAL HIGH (ref 0.1–1.0)
Monocytes Relative: 16 %
Neutro Abs: 6 10*3/uL (ref 1.7–7.7)
Neutrophils Relative %: 60 %
Platelets: 437 10*3/uL — ABNORMAL HIGH (ref 150–400)
RBC: 3.64 MIL/uL — ABNORMAL LOW (ref 4.22–5.81)
RDW: 15.2 % (ref 11.5–15.5)
Smear Review: NORMAL
WBC: 10.1 10*3/uL (ref 4.0–10.5)
nRBC: 0.8 % — ABNORMAL HIGH (ref 0.0–0.2)

## 2022-07-12 LAB — LACTATE DEHYDROGENASE: LDH: 412 U/L — ABNORMAL HIGH (ref 98–192)

## 2022-07-15 ENCOUNTER — Ambulatory Visit (INDEPENDENT_AMBULATORY_CARE_PROVIDER_SITE_OTHER): Payer: 59 | Admitting: Podiatry

## 2022-07-15 DIAGNOSIS — M25872 Other specified joint disorders, left ankle and foot: Secondary | ICD-10-CM | POA: Diagnosis not present

## 2022-07-15 NOTE — Progress Notes (Signed)
Chief Complaint  Patient presents with   Foot Problem    Capsulitis of left foot    HPI: 61 y.o. male presenting today for follow-up evaluation of pain and tenderness associated to the plantar aspect of the first MTP left foot.  Patient has been WB in the postsurgical shoe over the past month. He no longer has any pain or tenderness.    Prior to his initial visit he went to an orthopedist who recommended arch supports because of suppose it "fallen arches" and applied felt padding to the arches of the bilateral feet.  Patient states that this aggravated his knee replacement to the right lower extremity and so he no longer wears arch supports to the right foot.  The arch support to the left foot does help alleviate some of the pain to the first MTP joint.  Past Medical History:  Diagnosis Date   Arthritis    osteoartiritis   Basal cell carcinoma 10/12/2009   Left post. shoulder sup. lateral scapula, 3cm med. to hemangioma.    Basal cell carcinoma 02/26/2015   Right temple. Nodular.   Basal cell carcinoma 03/21/2019   Right inf. med. pectoral. Nodular. EDC   Basal cell carcinoma 03/21/2019   Left lat. abdomen parallel lat. to umbilicus. Micronodular. EDC.   Dysplastic nevus 01/28/2008   Left upper back medial sup scapula. Moderate to severe atypia, margins involved. Excised 03/04/2008, persistent DN, close to margin.   Dysplastic nevus 07/01/2010   Left low back paraspinal lat. Mild atypiua, margins involved.    Dysplastic nevus 07/01/2010   Left low back paraspinal me. Mild atypia, margins involved.    Gout    High platelet count    Prostate cancer ([place])    per pt    Past Surgical History:  Procedure Laterality Date   BONE MARROW BIOPSY     COLONOSCOPY  2016   CYSTECTOMY  years ago   pilonidal cyst removed   KNEE SURGERY Right 20 years ago   arthrocscopy   TOTAL KNEE ARTHROPLASTY Right 08/15/2016   Procedure: RIGHT TOTAL KNEE ARTHROPLASTY;  Surgeon: Gaynelle Arabian, MD;   Location: [facility];  Service: Orthopedics;  Laterality: Right;    Allergies  Allergen Reactions   Penicillins     Reaction as a child Has patient had a PCN reaction causing immediate rash, facial/tongue/throat swelling, SOB or lightheadedness with hypotension:unsure Has patient had a PCN reaction causing severe rash involving mucus membranes or skin necrosis:unsure Has patient had a PCN reaction that required hospitalization:No Has patient had a PCN reaction occurring within the last 10 years: No If all of the above answers are "NO", then may proceed with Cephalosporin use.      Physical Exam: General: The patient is alert and oriented x3 in no acute distress.  Dermatology: Skin is warm, dry and supple bilateral lower extremities. Negative for open lesions or macerations.  Vascular: Palpable pedal pulses bilaterally. Capillary refill within normal limits.  Negative for any significant edema or erythema  Neurological: Light touch and protective threshold grossly intact  Musculoskeletal Exam: No pedal deformities noted.  No fallen arches noted.  Patient maintains medial longitudinal arch with weightbearing.  No pain with palpation to the tibial sesamoid of the left foot today.  No pain with palpation of the joint itself or with range of motion of the joint  Radiographic Exam LT foot 06/15/2022:  Normal osseous mineralization. Joint spaces preserved. No fracture/dislocation/boney destruction.    Assessment: 1.  Tibial sesamoiditis left  Plan of Care:  1. Patient evaluated.   2. Overall improvement and no pain with palpation to the tibial sesamoid today.  3. DC postsurgical shoe. Transition into good supportive shoes, including [employer]. 4. Slowly increase activity. Care to avoid high impact activities.  5. 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 Edrick Kins, DPM     [address], [place] [address]                Office [phone]  Fax [phone]

## 2022-07-26 NOTE — Progress Notes (Unsigned)
Established patient visit   Patient: Anthony Graves   DOB: Jul 06, 1961   62 y.o. Male  MRN: [id number] Visit Date: 07/27/2022  Today's healthcare provider: Gwyneth Sprout, FNP  Introduced to nurse practitioner role and practice setting.  All questions answered.  Discussed provider/patient relationship and expectations.  Subjective    ABDOMINAL ISSUES Duration: days Nature: dull, pressure-like, tearing, and throbbing Location: epigastric and peri-umbilical  Severity: mild  Radiation: no Episode duration: Frequency: occasional Alleviating factors: Aggravating factors: Treatments attempted: none Constipation: no Diarrhea: yes Episodes of diarrhea/day: 2 Bms 1 hour a part 1/9; no sick contacts or recent travel Mucous in the stool: no Heartburn: no Bloating:yes Flatulence: yes Nausea: no Vomiting: no Episodes of vomit/day: n/a Melena or hematochezia: no Rash: no Jaundice: no Fever: no Weight loss: no  Medications: Outpatient Medications Prior to Visit  Medication Sig   allopurinol (ZYLOPRIM) 100 MG tablet Take 300 mg by mouth daily.   aspirin EC 81 MG tablet Take 1 tablet by mouth daily.   tamsulosin (FLOMAX) 0.4 MG CAPS capsule TAKE 1 CAPSULE BY MOUTH EVERY DAY   No facility-administered medications prior to visit.    Review of Systems  Gastrointestinal:  Positive for diarrhea.       Objective    BP (!) 110/56   Pulse 95   Ht '5\' 10"'$  (1.778 m)   Wt 169 lb 8 oz (76.9 kg)   SpO2 100%   BMI 24.32 kg/m    Physical Exam Vitals and nursing note reviewed.  Constitutional:      Appearance: Normal appearance. He is well-developed and normal weight.  HENT:     Head: Normocephalic and atraumatic.  Eyes:     Pupils: Pupils are equal, round, and reactive to light.  Cardiovascular:     Rate and Rhythm: Normal rate and regular rhythm.     Pulses: Normal pulses.     Heart sounds: Normal heart sounds.  Pulmonary:     Effort: Pulmonary effort is normal.      Breath sounds: Normal breath sounds.  Abdominal:     General: Abdomen is flat. Bowel sounds are normal. There is no distension.     Palpations: Abdomen is soft.     Tenderness: There is no abdominal tenderness. There is no right CVA tenderness, left CVA tenderness, guarding or rebound.     Hernia: No hernia is present.  Musculoskeletal:        General: Normal range of motion.     Cervical back: Normal range of motion.  Skin:    General: Skin is warm and dry.     Capillary Refill: Capillary refill takes less than 2 seconds.  Neurological:     General: No focal deficit present.     Mental Status: He is alert and oriented to person, place, and time. Mental status is at baseline.  Psychiatric:        Mood and Affect: Mood normal.        Behavior: Behavior normal.     No results found for any visits on 07/27/22.  Assessment & Plan     Problem List Items Addressed This Visit       Other   Epigastric pain - Primary    Acute, [place] limiting S/S <7 days Reports 2 loose Bms yesterday 1/9 Was previously following bland diet and then started to reintroduce foods Normal exam today; recommend CBC and CMP given GI losses and concern for infection as well as  H Pylori testing given complaints of epigastric pain as well as increased flatulence and belching       Relevant Orders   CBC with Differential/Platelet   Comprehensive Metabolic Panel (CMET)   H. pylori breath test   Loose bowel movement    2 Bms -watery -1 hours apart -no cramping -no mucus -no blood      Relevant Orders   CBC with Differential/Platelet   Comprehensive Metabolic Panel (CMET)   H. pylori breath test   Return if symptoms worsen or fail to improve.     Vonna Kotyk, FNP, have reviewed all documentation for this visit. The documentation on 07/27/22 for the exam, diagnosis, procedures, and orders are all accurate and complete.  Gwyneth Sprout, [place] [phone]  (phone) [phone] (fax)  [place]

## 2022-07-27 ENCOUNTER — Ambulatory Visit (INDEPENDENT_AMBULATORY_CARE_PROVIDER_SITE_OTHER): Payer: Managed Care, Other (non HMO) | Admitting: Family Medicine

## 2022-07-27 ENCOUNTER — Encounter: Payer: Self-pay | Admitting: Family Medicine

## 2022-07-27 VITALS — BP 110/56 | HR 95 | Ht 70.0 in | Wt 169.5 lb

## 2022-07-27 DIAGNOSIS — R195 Other fecal abnormalities: Secondary | ICD-10-CM | POA: Diagnosis not present

## 2022-07-27 DIAGNOSIS — R1013 Epigastric pain: Secondary | ICD-10-CM | POA: Diagnosis not present

## 2022-07-27 MED ORDER — OMEPRAZOLE 20 MG PO CPDR: 20.0000 mg | DELAYED_RELEASE_CAPSULE | Freq: Every day | ORAL | 3 refills | Status: DC

## 2022-07-27 NOTE — Assessment & Plan Note (Signed)
2 Bms -watery -1 hours apart -no cramping -no mucus -no blood

## 2022-07-27 NOTE — Assessment & Plan Note (Signed)
Acute, [place] limiting S/S <7 days Reports 2 loose Bms yesterday 1/9 Was previously following bland diet and then started to reintroduce foods Normal exam today; recommend CBC and CMP given GI losses and concern for infection as well as H Pylori testing given complaints of epigastric pain as well as increased flatulence and belching

## 2022-07-28 LAB — CBC WITH DIFFERENTIAL/PLATELET
Basophils Absolute: 0.1 10*3/uL (ref 0.0–0.2)
Basos: 1 %
EOS (ABSOLUTE): 0 10*3/uL (ref 0.0–0.4)
Eos: 0 %
Hematocrit: 37.8 % (ref 37.5–51.0)
Hemoglobin: 12.1 g/dL — ABNORMAL LOW (ref 13.0–17.7)
Immature Grans (Abs): 0.3 10*3/uL — ABNORMAL HIGH (ref 0.0–0.1)
Immature Granulocytes: 4 %
Lymphocytes Absolute: 2.2 10*3/uL (ref 0.7–3.1)
Lymphs: 24 %
MCH: 30.9 pg (ref 26.6–33.0)
MCHC: 32 g/dL (ref 31.5–35.7)
MCV: 97 fL (ref 79–97)
Monocytes Absolute: 1.5 10*3/uL — ABNORMAL HIGH (ref 0.1–0.9)
Monocytes: 16 %
Neutrophils Absolute: 5.3 10*3/uL (ref 1.4–7.0)
Neutrophils: 55 %
Platelets: 482 10*3/uL — ABNORMAL HIGH (ref 150–450)
RBC: 3.91 x10E6/uL — ABNORMAL LOW (ref 4.14–5.80)
RDW: 14.1 % (ref 11.6–15.4)
WBC: 9.6 10*3/uL (ref 3.4–10.8)

## 2022-07-28 LAB — COMPREHENSIVE METABOLIC PANEL
ALT: 14 IU/L (ref 0–44)
AST: 21 IU/L (ref 0–40)
Albumin/Globulin Ratio: 2.1 (ref 1.2–2.2)
Albumin: 4.2 g/dL (ref 3.9–4.9)
Alkaline Phosphatase: 67 IU/L (ref 44–121)
BUN/Creatinine Ratio: 7 — ABNORMAL LOW (ref 10–24)
BUN: 7 mg/dL — ABNORMAL LOW (ref 8–27)
Bilirubin Total: 0.3 mg/dL (ref 0.0–1.2)
CO2: 23 mmol/L (ref 20–29)
Calcium: 9 mg/dL (ref 8.6–10.2)
Chloride: 105 mmol/L (ref 96–106)
Creatinine, Ser: 0.96 mg/dL (ref 0.76–1.27)
Globulin, Total: 2 g/dL (ref 1.5–4.5)
Glucose: 101 mg/dL — ABNORMAL HIGH (ref 70–99)
Potassium: 5.5 mmol/L — ABNORMAL HIGH (ref 3.5–5.2)
Sodium: 142 mmol/L (ref 134–144)
Total Protein: 6.2 g/dL (ref 6.0–8.5)
eGFR: 89 mL/min/{1.73_m2} (ref >59)

## 2022-07-28 NOTE — Progress Notes (Signed)
We apologize that H Pylori test was on another lab requisition sheet and this was not completed on day of appt. If symptoms continue, not improved with OTC medication discussed recommend you present back to a [facility] facility for completion.  Cell count show stable infection numbers and improving anemia.  Blood chemistry show slight elevation in blood sugar/glucose levels and slight elevation in potassium. Could indicate slight dehydration following GI complaints.

## 2022-08-10 ENCOUNTER — Ambulatory Visit: Payer: Managed Care, Other (non HMO) | Admitting: Dermatology

## 2022-08-10 VITALS — BP 104/63 | HR 63

## 2022-08-10 DIAGNOSIS — Z86018 Personal history of other benign neoplasm: Secondary | ICD-10-CM | POA: Diagnosis not present

## 2022-08-10 DIAGNOSIS — L820 Inflamed seborrheic keratosis: Secondary | ICD-10-CM

## 2022-08-10 DIAGNOSIS — Z85828 Personal history of other malignant neoplasm of skin: Secondary | ICD-10-CM

## 2022-08-10 DIAGNOSIS — Z1283 Encounter for screening for malignant neoplasm of skin: Secondary | ICD-10-CM | POA: Diagnosis not present

## 2022-08-10 DIAGNOSIS — D1801 Hemangioma of skin and subcutaneous tissue: Secondary | ICD-10-CM

## 2022-08-10 DIAGNOSIS — L814 Other melanin hyperpigmentation: Secondary | ICD-10-CM

## 2022-08-10 DIAGNOSIS — D229 Melanocytic nevi, unspecified: Secondary | ICD-10-CM

## 2022-08-10 DIAGNOSIS — L578 Other skin changes due to chronic exposure to nonionizing radiation: Secondary | ICD-10-CM

## 2022-08-10 DIAGNOSIS — L821 Other seborrheic keratosis: Secondary | ICD-10-CM

## 2022-08-10 NOTE — Patient Instructions (Addendum)
Cryotherapy Aftercare  Wash gently with soap and water everyday.   Apply Vaseline and Band-Aid daily until healed.     Due to recent changes in healthcare laws, you may see results of your pathology and/or laboratory studies on MyChart before the doctors have had a chance to review them. We understand that in some cases there may be results that are confusing or concerning to you. Please understand that not all results are received at the same time and often the doctors may need to interpret multiple results in order to provide you with the best plan of care or course of treatment. Therefore, we ask that you please give us 2 business days to thoroughly review all your results before contacting the office for clarification. Should we see a critical lab result, you will be contacted sooner.   If You Need Anything After Your Visit  If you have any questions or concerns for your doctor, please call our main line at [phone] and press option 4 to reach your doctor's medical assistant. If no one answers, please leave a voicemail as directed and we will return your call as soon as possible. Messages left after 4 pm will be answered the following business day.   You may also send us a message via MyChart. We typically respond to MyChart messages within 1-2 business days.  For prescription refills, please ask your pharmacy to contact our office. Our fax number is [phone].  If you have an urgent issue when the clinic is closed that cannot wait until the next business day, you can page your doctor at the number below.    Please note that while we do our best to be available for urgent issues outside of office hours, we are not available 24/7.   If you have an urgent issue and are unable to reach us, you may choose to seek medical care at your doctor's office, retail clinic, urgent care center, or emergency room.  If you have a medical emergency, please immediately call 911 or go to the  emergency department.  Pager Numbers  - Dr. Kowalski: [phone]  - Dr. Moye: [phone]  - Dr. Stewart: [phone]  In the event of inclement weather, please call our main line at [phone] for an update on the status of any delays or closures.  Dermatology Medication Tips: Please keep the boxes that topical medications come in in order to help keep track of the instructions about where and how to use these. Pharmacies typically print the medication instructions only on the boxes and not directly on the medication tubes.   If your medication is too expensive, please contact our office at [phone] option 4 or send us a message through MyChart.   We are unable to tell what your co-pay for medications will be in advance as this is different depending on your insurance coverage. However, we may be able to find a substitute medication at lower cost or fill out paperwork to get insurance to cover a needed medication.   If a prior authorization is required to get your medication covered by your insurance company, please allow us 1-2 business days to complete this process.  Drug prices often vary depending on where the prescription is filled and some pharmacies may offer cheaper prices.  The website www.goodrx.com contains coupons for medications through different pharmacies. The prices here do not account for what the cost may be with help from insurance (it may be cheaper with your insurance), but the website can   give you the price if you did not use any insurance.  - You can print the associated coupon and take it with your prescription to the pharmacy.  - You may also stop by our office during regular business hours and pick up a GoodRx coupon card.  - If you need your prescription sent electronically to a different pharmacy, notify our office through [facility] MyChart or by phone at [phone] option 4.     Si Usted Necesita Algo Despus de Su Visita  Tambin puede  enviarnos un mensaje a travs de MyChart. Por lo general respondemos a los mensajes de MyChart en el transcurso de 1 a 2 das hbiles.  Para renovar recetas, por favor pida a su farmacia que se ponga en contacto con nuestra oficina. Nuestro nmero de fax es el [phone].  Si tiene un asunto urgente cuando la clnica est cerrada y que no puede esperar hasta el siguiente da hbil, puede llamar/localizar a su doctor(a) al nmero que aparece a continuacin.   Por favor, tenga en cuenta que aunque hacemos todo lo posible para estar disponibles para asuntos urgentes fuera del horario de oficina, no estamos disponibles las 24 horas del da, los 7 das de la semana.   Si tiene un problema urgente y no puede comunicarse con nosotros, puede optar por buscar atencin mdica  en el consultorio de su doctor(a), en una clnica privada, en un centro de atencin urgente o en una sala de emergencias.  Si tiene una emergencia mdica, por favor llame inmediatamente al 911 o vaya a la sala de emergencias.  Nmeros de bper  - Dr. Kowalski: [phone]  - Dra. Moye: [phone]  - Dra. Stewart: [phone]  En caso de inclemencias del tiempo, por favor llame a nuestra lnea principal al [phone] para una actualizacin sobre el estado de cualquier retraso o cierre.  Consejos para la medicacin en dermatologa: Por favor, guarde las cajas en las que vienen los medicamentos de uso tpico para ayudarle a seguir las instrucciones sobre dnde y cmo usarlos. Las farmacias generalmente imprimen las instrucciones del medicamento slo en las cajas y no directamente en los tubos del medicamento.   Si su medicamento es muy caro, por favor, pngase en contacto con nuestra oficina llamando al [phone] y presione la opcin 4 o envenos un mensaje a travs de MyChart.   No podemos decirle cul ser su copago por los medicamentos por adelantado ya que esto es diferente dependiendo de la cobertura de su seguro.  Sin embargo, es posible que podamos encontrar un medicamento sustituto a menor costo o llenar un formulario para que el seguro cubra el medicamento que se considera necesario.   Si se requiere una autorizacin previa para que su compaa de seguros cubra su medicamento, por favor permtanos de 1 a 2 das hbiles para completar este proceso.  Los precios de los medicamentos varan con frecuencia dependiendo del lugar de dnde se surte la receta y alguna farmacias pueden ofrecer precios ms baratos.  El sitio web www.goodrx.com tiene cupones para medicamentos de diferentes farmacias. Los precios aqu no tienen en cuenta lo que podra costar con la ayuda del seguro (puede ser ms barato con su seguro), pero el sitio web puede darle el precio si no utiliz ningn seguro.  - Puede imprimir el cupn correspondiente y llevarlo con su receta a la farmacia.  - Tambin puede pasar por nuestra oficina durante el horario de atencin regular y recoger una tarjeta de cupones de GoodRx.  -   Si necesita que su receta se enve electrnicamente a una farmacia diferente, informe a nuestra oficina a travs de MyChart de [facility] o por telfono llamando al [phone] y presione la opcin 4.  

## 2022-08-10 NOTE — Progress Notes (Signed)
Follow-Up Visit   Subjective  Anthony Graves is a 62 y.o. male who presents for the following: Total body skin exam (Hx of BCC, Dysplastic nevi). The patient presents for Total-Body Skin Exam (TBSE) for skin cancer screening and mole check.  The patient has spots, moles and lesions to be evaluated, some may be new or changing and the patient has concerns that these could be cancer.   The following portions of the chart were reviewed this encounter and updated as appropriate:   Tobacco  Allergies  Meds  Problems  Med Hx  Surg Hx  Fam Hx     Review of Systems:  No other skin or systemic complaints except as noted in HPI or Assessment and Plan.  Objective  Well appearing patient in no apparent distress; mood and affect are within normal limits.  A full examination was performed including scalp, head, eyes, ears, nose, lips, neck, chest, axillae, abdomen, back, buttocks, bilateral upper extremities, bilateral lower extremities, hands, feet, fingers, toes, fingernails, and toenails. All findings within normal limits unless otherwise noted below.  L temple within hairline x 3, L arm x 1, R temple within hairline x 4 (8) Stuck on waxy paps with erythema   Assessment & Plan   Lentigines - Scattered tan macules - Due to sun exposure - Benign-appearing, observe - Recommend daily broad spectrum sunscreen SPF 30+ to sun-exposed areas, reapply every 2 hours as needed. - Call for any changes - upper back  Seborrheic Keratoses - Stuck-on, waxy, tan-brown papules and/or plaques  - Benign-appearing - Discussed benign etiology and prognosis. - Observe - Call for any changes - trunk, arms, feet  Melanocytic Nevi - Tan-brown and/or pink-flesh-colored symmetric macules and papules - Benign appearing on exam today - Observation - Call clinic for new or changing moles - Recommend daily use of broad spectrum spf 30+ sunscreen to sun-exposed areas.  - trunk  Hemangiomas - Red  papules - Discussed benign nature - Observe - Call for any changes - trunk  Actinic Damage - Chronic condition, secondary to cumulative UV/sun exposure - diffuse scaly erythematous macules with underlying dyspigmentation - Recommend daily broad spectrum sunscreen SPF 30+ to sun-exposed areas, reapply every 2 hours as needed.  - Staying in the shade or wearing long sleeves, sun glasses (UVA+UVB protection) and wide brim hats (4-inch brim around the entire circumference of the hat) are also recommended for sun protection.  - Call for new or changing lesions.  Skin cancer screening performed today.   Inflamed seborrheic keratosis (8) L temple within hairline x 3, L arm x 1, R temple within hairline x 4  Symptomatic, irritating, patient would like treated.   Destruction of lesion - L temple within hairline x 3, L arm x 1, R temple within hairline x 4 Complexity: simple   Destruction method: cryotherapy   Informed consent: discussed and consent obtained   Timeout:  patient name, date of birth, surgical site, and procedure verified Lesion destroyed using liquid nitrogen: Yes   Region frozen until ice ball extended beyond lesion: Yes   Outcome: patient tolerated procedure well with no complications   Post-procedure details: wound care instructions given     History of Basal Cell Carcinoma of the Skin - No evidence of recurrence today - Recommend regular full body skin exams - Recommend daily broad spectrum sunscreen SPF 30+ to sun-exposed areas, reapply every 2 hours as needed.  - Call if any new or changing lesions are noted between office  visits  - L post shoulder sup lat scapula, R temple, R inf med pectoral, L lat abd parallel lat to umbilicus  History of Dysplastic Nevi - No evidence of recurrence today - Recommend regular full body skin exams - Recommend daily broad spectrum sunscreen SPF 30+ to sun-exposed areas, reapply every 2 hours as needed.  - Call if any new or  changing lesions are noted between office visits  -L upper back medial sup scapula, L low back paraspinal lat, L low back paraspinal med  Return in about 2 months (around 10/09/2022) for ISK f/u, 1 yr TBSE , Hx of BCC, Hx of Dysplastic nevi.  I, Othelia Pulling, RMA, am acting as scribe for Sarina Ser, MD . Documentation: I have reviewed the above documentation for accuracy and completeness, and I agree with the above.  Sarina Ser, MD

## 2022-08-12 ENCOUNTER — Inpatient Hospital Stay: Payer: Managed Care, Other (non HMO) | Attending: Internal Medicine

## 2022-08-12 ENCOUNTER — Inpatient Hospital Stay (HOSPITAL_BASED_OUTPATIENT_CLINIC_OR_DEPARTMENT_OTHER): Payer: Managed Care, Other (non HMO) | Admitting: Internal Medicine

## 2022-08-12 ENCOUNTER — Encounter: Payer: Self-pay | Admitting: Internal Medicine

## 2022-08-12 VITALS — BP 128/71 | HR 59 | Temp 96.7°F | Resp 16 | Wt 170.9 lb

## 2022-08-12 DIAGNOSIS — M109 Gout, unspecified: Secondary | ICD-10-CM | POA: Insufficient documentation

## 2022-08-12 DIAGNOSIS — Z79899 Other long term (current) drug therapy: Secondary | ICD-10-CM | POA: Insufficient documentation

## 2022-08-12 DIAGNOSIS — D473 Essential (hemorrhagic) thrombocythemia: Secondary | ICD-10-CM | POA: Insufficient documentation

## 2022-08-12 LAB — CBC WITH DIFFERENTIAL/PLATELET
Abs Immature Granulocytes: 0.2 10*3/uL — ABNORMAL HIGH (ref 0.00–0.07)
Basophils Absolute: 0 10*3/uL (ref 0.0–0.1)
Basophils Relative: 0 %
Eosinophils Absolute: 0.2 10*3/uL (ref 0.0–0.5)
Eosinophils Relative: 2 %
HCT: 39.4 % (ref 39.0–52.0)
Hemoglobin: 12.3 g/dL — ABNORMAL LOW (ref 13.0–17.0)
Lymphocytes Relative: 15 %
Lymphs Abs: 1.6 10*3/uL (ref 0.7–4.0)
MCH: 31.2 pg (ref 26.0–34.0)
MCHC: 31.2 g/dL (ref 30.0–36.0)
MCV: 100 fL (ref 80.0–100.0)
Metamyelocytes Relative: 2 %
Monocytes Absolute: 1.1 10*3/uL — ABNORMAL HIGH (ref 0.1–1.0)
Monocytes Relative: 10 %
Neutro Abs: 7.7 10*3/uL (ref 1.7–7.7)
Neutrophils Relative %: 71 %
Platelets: 392 10*3/uL (ref 150–400)
RBC: 3.94 MIL/uL — ABNORMAL LOW (ref 4.22–5.81)
RDW: 15.1 % (ref 11.5–15.5)
Smear Review: NORMAL
WBC: 10.9 10*3/uL — ABNORMAL HIGH (ref 4.0–10.5)
nRBC: 0.4 % — ABNORMAL HIGH (ref 0.0–0.2)

## 2022-08-12 LAB — COMPREHENSIVE METABOLIC PANEL
ALT: 17 U/L (ref 0–44)
AST: 28 U/L (ref 15–41)
Albumin: 4.2 g/dL (ref 3.5–5.0)
Alkaline Phosphatase: 47 U/L (ref 38–126)
Anion gap: 10 (ref 5–15)
BUN: 23 mg/dL (ref 8–23)
CO2: 27 mmol/L (ref 22–32)
Calcium: 9 mg/dL (ref 8.9–10.3)
Chloride: 102 mmol/L (ref 98–111)
Creatinine, Ser: 0.96 mg/dL (ref 0.61–1.24)
GFR, Estimated: >60 mL/min (ref >60)
Glucose, Bld: 96 mg/dL (ref 70–99)
Potassium: 4.4 mmol/L (ref 3.5–5.1)
Sodium: 139 mmol/L (ref 135–145)
Total Bilirubin: 0.6 mg/dL (ref 0.3–1.2)
Total Protein: 6.7 g/dL (ref 6.5–8.1)

## 2022-08-12 LAB — LACTATE DEHYDROGENASE: LDH: 452 U/L — ABNORMAL HIGH (ref 98–192)

## 2022-08-12 LAB — URIC ACID: Uric Acid, Serum: 4.9 mg/dL (ref 3.7–8.6)

## 2022-08-12 NOTE — Progress Notes (Signed)
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  Patient continues to be asymptomatic.  LDH slightly elevated.    # MAY 2023-second opinion-[facility] Dr. Evelene Croon; DISCONTINUED anagrelide [secondary to concern for bone marrow fibrosis]; rising LDH; on aspirin only   DIAGNOSIS: ET    Essential thrombocytosis ([place])   INTERVAL HISTORY: Ambulating independently.  Alone.  Patient 16 very pleasant male who returns to clinic for follow-up of essential thrombocytosis, CALR mutation, previously on anagrelide/concerns of myelofibrosis-currently on aspirin alone is here for follow-up.  In the interim patient has been evaluated at [facility].  Currently being considered for pegylated interferon.  Awaiting insurance approval.  Patient denies any fatigue.  Denies any shortness of breath or weight loss or night sweats.   Review of Systems  Constitutional:  Negative for chills, diaphoresis, fever, malaise/fatigue and weight loss.  HENT:  Negative for nosebleeds and sore throat.   Eyes:  Negative for double vision.  Respiratory:  Negative for cough, hemoptysis, sputum production, shortness of breath and wheezing.   Cardiovascular:   Negative for chest pain, palpitations, orthopnea and leg swelling.  Gastrointestinal:  Negative for abdominal pain, blood in stool, constipation, diarrhea, heartburn, melena, nausea and vomiting.  Genitourinary:  Negative for dysuria, frequency and urgency.  Musculoskeletal:  Negative for back pain and joint pain.  Skin: Negative.  Negative for itching and rash.  Neurological:  Negative for dizziness, tingling, focal weakness, weakness and headaches.  Endo/Heme/Allergies:  Does not bruise/bleed easily.  Psychiatric/Behavioral:  Negative for depression. The patient is not nervous/anxious and does not have insomnia.    PAST MEDICAL HISTORY :  Past Medical History:  Diagnosis Date   Arthritis    osteoartiritis   Basal cell carcinoma 10/12/2009   Left post. shoulder sup. lateral scapula, 3cm med. to hemangioma.    Basal cell carcinoma 02/26/2015   Right temple. Nodular.   Basal cell carcinoma 03/21/2019   Right inf. med. pectoral. Nodular. EDC   Basal cell carcinoma 03/21/2019   Left lat. abdomen parallel lat. to umbilicus. Micronodular. EDC.   Dysplastic nevus 01/28/2008   Left upper back medial sup scapula. Moderate to severe atypia, margins involved. Excised 03/04/2008, persistent DN, close to margin.   Dysplastic nevus 07/01/2010   Left low back paraspinal lat. Mild atypiua, margins involved.    Dysplastic nevus 07/01/2010   Left low back paraspinal me. Mild atypia, margins involved.    Gout    High platelet count    Prostate cancer ([place])    per pt    PAST SURGICAL HISTORY :   Past Surgical History:  Procedure Laterality Date   BONE MARROW BIOPSY     COLONOSCOPY  2016   CYSTECTOMY  years ago   pilonidal cyst removed   KNEE SURGERY Right 20 years ago   arthrocscopy   TOTAL KNEE ARTHROPLASTY Right 08/15/2016   Procedure: RIGHT TOTAL  KNEE ARTHROPLASTY;  Surgeon: Gaynelle Arabian, MD;  Location: [facility];  Service: Orthopedics;  Laterality: Right;   FAMILY HISTORY :  History  reviewed. No pertinent family history.  SOCIAL HISTORY:   Social History   Tobacco Use   Smoking status: Never   Smokeless tobacco: Never  Vaping Use   Vaping Use: Never used  Substance Use Topics   Alcohol use: Yes    Alcohol/week: 3.0 - 7.0 standard drinks of alcohol    Types: 3 - 7 Glasses of wine per week   Drug use: No    ALLERGIES:  is allergic to penicillins.  MEDICATIONS:  Current Outpatient Medications  Medication Sig Dispense Refill   allopurinol (ZYLOPRIM) 100 MG tablet Take 300 mg by mouth daily.     aspirin EC 81 MG tablet Take 1 tablet by mouth daily.     tamsulosin (FLOMAX) 0.4 MG CAPS capsule TAKE 1 CAPSULE BY MOUTH EVERY DAY 90 capsule 3   omeprazole (PRILOSEC) 20 MG capsule Take 1 capsule (20 mg total) by mouth daily. 30 capsule 3   No current facility-administered medications for this visit.    PHYSICAL EXAMINATION: ECOG PERFORMANCE STATUS: 0 - Asymptomatic  BP 128/71 (BP Location: Right Arm, Patient Position: Sitting)   Pulse (!) 59   Temp (!) 96.7 F (35.9 C) (Tympanic)   Resp 16   Wt 170 lb 14.4 oz (77.5 kg)   BMI 24.52 kg/m   Filed Weights   08/12/22 1000  Weight: 170 lb 14.4 oz (77.5 kg)    Physical Exam Constitutional:      Appearance: He is not ill-appearing.  Eyes:     General: No scleral icterus.    Conjunctiva/sclera: Conjunctivae normal.  Cardiovascular:     Rate and Rhythm: Normal rate and regular rhythm.  Abdominal:     General: There is no distension.     Palpations: Abdomen is soft.     Tenderness: There is no abdominal tenderness. There is no guarding.  Musculoskeletal:        General: No deformity.     Right lower leg: No edema.     Left lower leg: No edema.  Lymphadenopathy:     Cervical: No cervical adenopathy.  Skin:    General: Skin is warm and dry.  Neurological:     Mental Status: He is alert and oriented to person, place, and time. Mental status is at baseline.  Psychiatric:        Mood and Affect: Mood  normal.        Behavior: Behavior normal.     LABORATORY DATA:  I have reviewed the data as listed    Component Value Date/Time   NA 139 08/12/2022 0952   NA 142 07/27/2022 0854   K 4.4 08/12/2022 0952   CL 102 08/12/2022 0952   CO2 27 08/12/2022 0952   GLUCOSE 96 08/12/2022 0952   BUN 23 08/12/2022 0952   BUN 7 (L) 07/27/2022 0854   CREATININE 0.96 08/12/2022 0952   CREATININE 1.10 04/25/2014 0823   CALCIUM 9.0 08/12/2022 0952   PROT 6.7 08/12/2022 0952   PROT 6.2 07/27/2022 0854   PROT 6.7 04/25/2014 0823   ALBUMIN 4.2 08/12/2022 0952   ALBUMIN 4.2 07/27/2022 0854   ALBUMIN 3.8 04/25/2014 0823   AST 28 08/12/2022 0952   AST 22 04/25/2014 0823   ALT 17 08/12/2022 0952   ALT 26 04/25/2014 0823   ALKPHOS 47 08/12/2022 0952   ALKPHOS 44 (L) 04/25/2014 6962  BILITOT 0.6 08/12/2022 0952   BILITOT 0.3 07/27/2022 0854   BILITOT 0.6 04/25/2014 0823   GFRNONAA >60 08/12/2022 0952   GFRNONAA >60 04/25/2014 0823   GFRNONAA >60 02/04/2014 0830   GFRAA >60 12/25/2019 1324   GFRAA >60 04/25/2014 0823   GFRAA >60 02/04/2014 0830   No results found for: "SPEP", "UPEP"  Lab Results  Component Value Date   WBC 10.9 (H) 08/12/2022   NEUTROABS 7.7 08/12/2022   HGB 12.3 (L) 08/12/2022   HCT 39.4 08/12/2022   MCV 100.0 08/12/2022   PLT 392 08/12/2022    ASSESSMENT & PLAN:   Essential thrombocytosis ([place]) # Essential thrombocytosis-LOW-INTERMEDIATE risk; [positive for CALR mutation]. hemoglobin is 12; white count 11-12. FEB 2023-bone marrow biopsy shows 80% hypercellularity; with fibrosis grade 2; no evidence of any blasts or concerns for progression obvious myelofibrosis.  Patient currently off anagrelide given the concerns of fibrosis from anagrelide.   # Given patient's overall extremity good health/good performance status- Patient is recommended to start pegylated interferon to help alter the course of the disease.  Discussed the complications of progression of disease to  include worsening fibrosis/second myelofibrosis ; and acute myeloid leukemia.  Discussed with Dr. Evelene Croon.  He is currently working with insurance/for approval.   # Again reviewed the potential side effects including but not limited to myalgias/flulike syndrome postinjection.  Also discussed regarding psychiatric illnesses like depression anxiety with injections.  Patient has no major contraindications at this time.  # History of gout- left big toe-allopurinol. Uric acid levels normal-continue follow-up with PCP- stable  # Elevated PSA/prostate cancer ~ [Dr.Stoiff]- MRI/[facility]- currently on surveillance- stable  # DISPOSITION:  # Follow-up in 4 months; MD; labs-CBC CMP/LDH/IURIC acid- Dr.B  Cc:   Dr. Rosanna Randy.  Essential thrombocytosis ([place]) # Essential thrombocytosis-LOW risk; [positive for CALR mutation]; however hemoglobin is 12; white count 11-12. FEB 2023-bone marrow biopsy shows 80% hypercellularity; with fibrosis grade 2; no evidence of any blasts or concerns for progression obvious myelofibrosis.   # Labs today reviewed and compared to past few months. Discussed in detail with patient. Currently off anagrelide d/t concerns of fibrosis from anagrelide. Reviewed labs today. Plts, LDH are stable. Continue holding anagrelide. Again reviewed rationale and what would warrant restarting vs changing treatments. He will see Dr Evelene Croon for follow up in 2 weeks. Will continue checking cbc, ldh monthly and have him follow up with Dr. Rogue Bussing in 3 months or sooner if Dr Evelene Croon recommends.     # History of Gout- left big toe- allopurinol. Uric acid levels normal-continue follow-up with PCP; STABLE.  # Elevated PSA/prostate cancer ~ [Dr.Stoiff]- MRI/[facility]- currently on surveillance.    # DISPOSITION:  1 month- lab (cbc, ldh) 2 month- lab (cbc, ldh) 3 month- lab (cbc, cmp, ldh, uric acid), Dr Rogue Bussing- [place]    Cammie Sickle, MD 08/12/2022 12:50 PM

## 2022-08-12 NOTE — Assessment & Plan Note (Addendum)
#  Essential thrombocytosis-LOW-INTERMEDIATE risk; [positive for CALR mutation]. hemoglobin is 12; white count 11-12. FEB 2023-bone marrow biopsy shows 80% hypercellularity; with fibrosis grade 2; no evidence of any blasts or concerns for progression obvious myelofibrosis.  Patient currently off anagrelide given the concerns of fibrosis from anagrelide.   # Given patient's overall extremity good health/good performance status- Patient is recommended to start pegylated interferon to help alter the course of the disease.  Discussed the complications of progression of disease to include worsening fibrosis/second myelofibrosis ; and acute myeloid leukemia.  Discussed with Dr. Evelene Croon.  He is currently working with insurance/for approval.   # Again reviewed the potential side effects including but not limited to myalgias/flulike syndrome postinjection.  Also discussed regarding psychiatric illnesses like depression anxiety with injections.  Patient has no major contraindications at this time.  # History of gout- left big toe-allopurinol. Uric acid levels normal-continue follow-up with PCP- stable  # Elevated PSA/prostate cancer ~ [Dr.Stoiff]- MRI/[facility]- currently on surveillance- stable  # DISPOSITION:  # Follow-up in 4 months; MD; labs-CBC CMP/LDH/IURIC acid- Dr.B  Cc:   Dr. Rosanna Randy.

## 2022-08-12 NOTE — Progress Notes (Signed)
Patient denies new problems/concerns today.   °

## 2022-08-17 ENCOUNTER — Encounter: Payer: Self-pay | Admitting: Dermatology

## 2022-09-27 ENCOUNTER — Encounter: Payer: Self-pay | Admitting: Internal Medicine

## 2022-09-29 ENCOUNTER — Other Ambulatory Visit: Payer: Self-pay | Admitting: *Deleted

## 2022-09-29 DIAGNOSIS — D473 Essential (hemorrhagic) thrombocythemia: Secondary | ICD-10-CM

## 2022-10-03 ENCOUNTER — Ambulatory Visit: Payer: 59 | Admitting: Dermatology

## 2022-10-06 ENCOUNTER — Other Ambulatory Visit: Payer: Self-pay | Admitting: *Deleted

## 2022-10-06 ENCOUNTER — Inpatient Hospital Stay: Payer: Managed Care, Other (non HMO) | Attending: Internal Medicine

## 2022-10-06 ENCOUNTER — Encounter: Payer: Self-pay | Admitting: *Deleted

## 2022-10-06 DIAGNOSIS — D473 Essential (hemorrhagic) thrombocythemia: Secondary | ICD-10-CM

## 2022-10-06 LAB — CBC WITH DIFFERENTIAL (CANCER CENTER ONLY)
Abs Immature Granulocytes: 0.14 10*3/uL — ABNORMAL HIGH (ref 0.00–0.07)
Basophils Absolute: 0 10*3/uL (ref 0.0–0.1)
Basophils Relative: 1 %
Eosinophils Absolute: 0 10*3/uL (ref 0.0–0.5)
Eosinophils Relative: 0 %
HCT: 40.6 % (ref 39.0–52.0)
Hemoglobin: 12.4 g/dL — ABNORMAL LOW (ref 13.0–17.0)
Immature Granulocytes: 2 %
Lymphocytes Relative: 22 %
Lymphs Abs: 1.5 10*3/uL (ref 0.7–4.0)
MCH: 30.2 pg (ref 26.0–34.0)
MCHC: 30.5 g/dL (ref 30.0–36.0)
MCV: 99 fL (ref 80.0–100.0)
Monocytes Absolute: 1.6 10*3/uL — ABNORMAL HIGH (ref 0.1–1.0)
Monocytes Relative: 22 %
Neutro Abs: 3.7 10*3/uL (ref 1.7–7.7)
Neutrophils Relative %: 53 %
Platelet Count: 262 10*3/uL (ref 150–400)
RBC: 4.1 MIL/uL — ABNORMAL LOW (ref 4.22–5.81)
RDW: 16.1 % — ABNORMAL HIGH (ref 11.5–15.5)
Smear Review: ADEQUATE
WBC Count: 7 10*3/uL (ref 4.0–10.5)
nRBC: 0.4 % — ABNORMAL HIGH (ref 0.0–0.2)

## 2022-10-06 LAB — MAGNESIUM: Magnesium: 2.3 mg/dL (ref 1.7–2.4)

## 2022-10-06 LAB — CMP (CANCER CENTER ONLY)
ALT: 21 U/L (ref 0–44)
AST: 33 U/L (ref 15–41)
Albumin: 4 g/dL (ref 3.5–5.0)
Alkaline Phosphatase: 53 U/L (ref 38–126)
Anion gap: 7 (ref 5–15)
BUN: 20 mg/dL (ref 8–23)
CO2: 25 mmol/L (ref 22–32)
Calcium: 8.8 mg/dL — ABNORMAL LOW (ref 8.9–10.3)
Chloride: 106 mmol/L (ref 98–111)
Creatinine: 1.01 mg/dL (ref 0.61–1.24)
GFR, Estimated: >60 mL/min (ref >60)
Glucose, Bld: 100 mg/dL — ABNORMAL HIGH (ref 70–99)
Potassium: 4.6 mmol/L (ref 3.5–5.1)
Sodium: 138 mmol/L (ref 135–145)
Total Bilirubin: 0.5 mg/dL (ref 0.3–1.2)
Total Protein: 6.7 g/dL (ref 6.5–8.1)

## 2022-10-07 ENCOUNTER — Telehealth: Payer: Self-pay

## 2022-10-07 NOTE — Telephone Encounter (Signed)
-----   Message from Cammie Sickle, MD sent at 10/06/2022 12:45 PM EDT ----- Please inform that his labs are unremarkable. And continue labs as recommended by [facility].  Thanks, GB

## 2022-10-27 ENCOUNTER — Ambulatory Visit: Payer: Managed Care, Other (non HMO) | Admitting: Dermatology

## 2022-10-27 ENCOUNTER — Other Ambulatory Visit: Payer: Managed Care, Other (non HMO)

## 2022-11-02 ENCOUNTER — Inpatient Hospital Stay: Payer: Managed Care, Other (non HMO) | Attending: Internal Medicine

## 2022-11-02 DIAGNOSIS — D473 Essential (hemorrhagic) thrombocythemia: Secondary | ICD-10-CM | POA: Diagnosis present

## 2022-11-02 LAB — CBC WITH DIFFERENTIAL (CANCER CENTER ONLY)
Abs Immature Granulocytes: 0.11 10*3/uL — ABNORMAL HIGH (ref 0.00–0.07)
Basophils Absolute: 0 10*3/uL (ref 0.0–0.1)
Basophils Relative: 0 %
Eosinophils Absolute: 0 10*3/uL (ref 0.0–0.5)
Eosinophils Relative: 0 %
HCT: 36.6 % — ABNORMAL LOW (ref 39.0–52.0)
Hemoglobin: 11.6 g/dL — ABNORMAL LOW (ref 13.0–17.0)
Immature Granulocytes: 2 %
Lymphocytes Relative: 27 %
Lymphs Abs: 1.4 10*3/uL (ref 0.7–4.0)
MCH: 30.4 pg (ref 26.0–34.0)
MCHC: 31.7 g/dL (ref 30.0–36.0)
MCV: 95.8 fL (ref 80.0–100.0)
Monocytes Absolute: 1.1 10*3/uL — ABNORMAL HIGH (ref 0.1–1.0)
Monocytes Relative: 20 %
Neutro Abs: 2.7 10*3/uL (ref 1.7–7.7)
Neutrophils Relative %: 51 %
Platelet Count: 177 10*3/uL (ref 150–400)
RBC: 3.82 MIL/uL — ABNORMAL LOW (ref 4.22–5.81)
RDW: 16.1 % — ABNORMAL HIGH (ref 11.5–15.5)
Smear Review: ADEQUATE
WBC Count: 5.3 10*3/uL (ref 4.0–10.5)
nRBC: 1.1 % — ABNORMAL HIGH (ref 0.0–0.2)

## 2022-11-02 LAB — CMP (CANCER CENTER ONLY)
ALT: 34 U/L (ref 0–44)
AST: 36 U/L (ref 15–41)
Albumin: 4 g/dL (ref 3.5–5.0)
Alkaline Phosphatase: 63 U/L (ref 38–126)
Anion gap: 8 (ref 5–15)
BUN: 23 mg/dL (ref 8–23)
CO2: 24 mmol/L (ref 22–32)
Calcium: 8.8 mg/dL — ABNORMAL LOW (ref 8.9–10.3)
Chloride: 106 mmol/L (ref 98–111)
Creatinine: 1.13 mg/dL (ref 0.61–1.24)
GFR, Estimated: >60 mL/min (ref >60)
Glucose, Bld: 123 mg/dL — ABNORMAL HIGH (ref 70–99)
Potassium: 4.5 mmol/L (ref 3.5–5.1)
Sodium: 138 mmol/L (ref 135–145)
Total Bilirubin: 0.5 mg/dL (ref 0.3–1.2)
Total Protein: 6.4 g/dL — ABNORMAL LOW (ref 6.5–8.1)

## 2022-11-02 LAB — LACTATE DEHYDROGENASE: LDH: 380 U/L — ABNORMAL HIGH (ref 98–192)

## 2022-11-23 ENCOUNTER — Encounter: Payer: Self-pay | Admitting: Internal Medicine

## 2022-11-23 ENCOUNTER — Other Ambulatory Visit: Payer: Self-pay | Admitting: *Deleted

## 2022-11-23 DIAGNOSIS — D473 Essential (hemorrhagic) thrombocythemia: Secondary | ICD-10-CM

## 2022-11-24 ENCOUNTER — Inpatient Hospital Stay: Payer: Managed Care, Other (non HMO) | Attending: Internal Medicine

## 2022-11-24 ENCOUNTER — Ambulatory Visit: Payer: Managed Care, Other (non HMO) | Admitting: Dermatology

## 2022-11-24 VITALS — BP 102/60 | HR 64

## 2022-11-24 DIAGNOSIS — L820 Inflamed seborrheic keratosis: Secondary | ICD-10-CM

## 2022-11-24 DIAGNOSIS — D473 Essential (hemorrhagic) thrombocythemia: Secondary | ICD-10-CM | POA: Insufficient documentation

## 2022-11-24 DIAGNOSIS — X32XXXA Exposure to sunlight, initial encounter: Secondary | ICD-10-CM

## 2022-11-24 DIAGNOSIS — W908XXA Exposure to other nonionizing radiation, initial encounter: Secondary | ICD-10-CM

## 2022-11-24 DIAGNOSIS — L821 Other seborrheic keratosis: Secondary | ICD-10-CM

## 2022-11-24 DIAGNOSIS — L578 Other skin changes due to chronic exposure to nonionizing radiation: Secondary | ICD-10-CM

## 2022-11-24 DIAGNOSIS — C61 Malignant neoplasm of prostate: Secondary | ICD-10-CM | POA: Diagnosis not present

## 2022-11-24 DIAGNOSIS — Z79899 Other long term (current) drug therapy: Secondary | ICD-10-CM | POA: Diagnosis not present

## 2022-11-24 LAB — LACTATE DEHYDROGENASE: LDH: 386 U/L — ABNORMAL HIGH (ref 98–192)

## 2022-11-24 LAB — CMP (CANCER CENTER ONLY)
ALT: 26 U/L (ref 0–44)
AST: 34 U/L (ref 15–41)
Albumin: 3.8 g/dL (ref 3.5–5.0)
Alkaline Phosphatase: 61 U/L (ref 38–126)
Anion gap: 9 (ref 5–15)
BUN: 18 mg/dL (ref 8–23)
CO2: 26 mmol/L (ref 22–32)
Calcium: 8.8 mg/dL — ABNORMAL LOW (ref 8.9–10.3)
Chloride: 106 mmol/L (ref 98–111)
Creatinine: 0.93 mg/dL (ref 0.61–1.24)
GFR, Estimated: >60 mL/min (ref >60)
Glucose, Bld: 79 mg/dL (ref 70–99)
Potassium: 4.7 mmol/L (ref 3.5–5.1)
Sodium: 141 mmol/L (ref 135–145)
Total Bilirubin: 0.6 mg/dL (ref 0.3–1.2)
Total Protein: 6.5 g/dL (ref 6.5–8.1)

## 2022-11-24 LAB — CBC WITH DIFFERENTIAL (CANCER CENTER ONLY)
Abs Immature Granulocytes: 0.11 10*3/uL — ABNORMAL HIGH (ref 0.00–0.07)
Basophils Absolute: 0 10*3/uL (ref 0.0–0.1)
Basophils Relative: 1 %
Eosinophils Absolute: 0 10*3/uL (ref 0.0–0.5)
Eosinophils Relative: 0 %
HCT: 38.1 % — ABNORMAL LOW (ref 39.0–52.0)
Hemoglobin: 12 g/dL — ABNORMAL LOW (ref 13.0–17.0)
Immature Granulocytes: 2 %
Lymphocytes Relative: 26 %
Lymphs Abs: 1.4 10*3/uL (ref 0.7–4.0)
MCH: 30.8 pg (ref 26.0–34.0)
MCHC: 31.5 g/dL (ref 30.0–36.0)
MCV: 97.7 fL (ref 80.0–100.0)
Monocytes Absolute: 1.3 10*3/uL — ABNORMAL HIGH (ref 0.1–1.0)
Monocytes Relative: 23 %
Neutro Abs: 2.7 10*3/uL (ref 1.7–7.7)
Neutrophils Relative %: 48 %
Platelet Count: 162 10*3/uL (ref 150–400)
RBC: 3.9 MIL/uL — ABNORMAL LOW (ref 4.22–5.81)
RDW: 16.4 % — ABNORMAL HIGH (ref 11.5–15.5)
WBC Count: 5.5 10*3/uL (ref 4.0–10.5)
nRBC: 1.6 % — ABNORMAL HIGH (ref 0.0–0.2)

## 2022-11-24 NOTE — Patient Instructions (Signed)
Due to recent changes in healthcare laws, you may see results of your pathology and/or laboratory studies on MyChart before the doctors have had a chance to review them. We understand that in some cases there may be results that are confusing or concerning to you. Please understand that not all results are received at the same time and often the doctors may need to interpret multiple results in order to provide you with the best plan of care or course of treatment. Therefore, we ask that you please give us 2 business days to thoroughly review all your results before contacting the office for clarification. Should we see a critical lab result, you will be contacted sooner.   If You Need Anything After Your Visit  If you have any questions or concerns for your doctor, please call our main line at [phone] and press option 4 to reach your doctor's medical assistant. If no one answers, please leave a voicemail as directed and we will return your call as soon as possible. Messages left after 4 pm will be answered the following business day.   You may also send us a message via MyChart. We typically respond to MyChart messages within 1-2 business days.  For prescription refills, please ask your pharmacy to contact our office. Our fax number is [phone].  If you have an urgent issue when the clinic is closed that cannot wait until the next business day, you can page your doctor at the number below.    Please note that while we do our best to be available for urgent issues outside of office hours, we are not available 24/7.   If you have an urgent issue and are unable to reach us, you may choose to seek medical care at your doctor's office, retail clinic, urgent care center, or emergency room.  If you have a medical emergency, please immediately call 911 or go to the emergency department.  Pager Numbers  - Dr. Kowalski: [phone]  - Dr. Moye: [phone]  - Dr. Stewart:  [phone]  In the event of inclement weather, please call our main line at [phone] for an update on the status of any delays or closures.  Dermatology Medication Tips: Please keep the boxes that topical medications come in in order to help keep track of the instructions about where and how to use these. Pharmacies typically print the medication instructions only on the boxes and not directly on the medication tubes.   If your medication is too expensive, please contact our office at [phone] option 4 or send us a message through MyChart.   We are unable to tell what your co-pay for medications will be in advance as this is different depending on your insurance coverage. However, we may be able to find a substitute medication at lower cost or fill out paperwork to get insurance to cover a needed medication.   If a prior authorization is required to get your medication covered by your insurance company, please allow us 1-2 business days to complete this process.  Drug prices often vary depending on where the prescription is filled and some pharmacies may offer cheaper prices.  The website www.goodrx.com contains coupons for medications through different pharmacies. The prices here do not account for what the cost may be with help from insurance (it may be cheaper with your insurance), but the website can give you the price if you did not use any insurance.  - You can print the associated coupon and take it with   your prescription to the pharmacy.  - You may also stop by our office during regular business hours and pick up a GoodRx coupon card.  - If you need your prescription sent electronically to a different pharmacy, notify our office through [facility] MyChart or by phone at [phone] option 4.     Si Usted Necesita Algo Despus de Su Visita  Tambin puede enviarnos un mensaje a travs de MyChart. Por lo general respondemos a los mensajes de MyChart en el transcurso de 1 a 2  das hbiles.  Para renovar recetas, por favor pida a su farmacia que se ponga en contacto con nuestra oficina. Nuestro nmero de fax es el [phone].  Si tiene un asunto urgente cuando la clnica est cerrada y que no puede esperar hasta el siguiente da hbil, puede llamar/localizar a su doctor(a) al nmero que aparece a continuacin.   Por favor, tenga en cuenta que aunque hacemos todo lo posible para estar disponibles para asuntos urgentes fuera del horario de oficina, no estamos disponibles las 24 horas del da, los 7 das de la semana.   Si tiene un problema urgente y no puede comunicarse con nosotros, puede optar por buscar atencin mdica  en el consultorio de su doctor(a), en una clnica privada, en un centro de atencin urgente o en una sala de emergencias.  Si tiene una emergencia mdica, por favor llame inmediatamente al 911 o vaya a la sala de emergencias.  Nmeros de bper  - Dr. Kowalski: [phone]  - Dra. Moye: [phone]  - Dra. Stewart: [phone]  En caso de inclemencias del tiempo, por favor llame a nuestra lnea principal al [phone] para una actualizacin sobre el estado de cualquier retraso o cierre.  Consejos para la medicacin en dermatologa: Por favor, guarde las cajas en las que vienen los medicamentos de uso tpico para ayudarle a seguir las instrucciones sobre dnde y cmo usarlos. Las farmacias generalmente imprimen las instrucciones del medicamento slo en las cajas y no directamente en los tubos del medicamento.   Si su medicamento es muy caro, por favor, pngase en contacto con nuestra oficina llamando al [phone] y presione la opcin 4 o envenos un mensaje a travs de MyChart.   No podemos decirle cul ser su copago por los medicamentos por adelantado ya que esto es diferente dependiendo de la cobertura de su seguro. Sin embargo, es posible que podamos encontrar un medicamento sustituto a menor costo o llenar un formulario para que el  seguro cubra el medicamento que se considera necesario.   Si se requiere una autorizacin previa para que su compaa de seguros cubra su medicamento, por favor permtanos de 1 a 2 das hbiles para completar este proceso.  Los precios de los medicamentos varan con frecuencia dependiendo del lugar de dnde se surte la receta y alguna farmacias pueden ofrecer precios ms baratos.  El sitio web www.goodrx.com tiene cupones para medicamentos de diferentes farmacias. Los precios aqu no tienen en cuenta lo que podra costar con la ayuda del seguro (puede ser ms barato con su seguro), pero el sitio web puede darle el precio si no utiliz ningn seguro.  - Puede imprimir el cupn correspondiente y llevarlo con su receta a la farmacia.  - Tambin puede pasar por nuestra oficina durante el horario de atencin regular y recoger una tarjeta de cupones de GoodRx.  - Si necesita que su receta se enve electrnicamente a una farmacia diferente, informe a nuestra oficina a travs de MyChart de [place]   o por telfono llamando al [phone] y presione la opcin 4.  

## 2022-11-24 NOTE — Progress Notes (Signed)
   Follow-Up Visit   Subjective  Anthony Graves is a 62 y.o. male who presents for the following: ISK follow up, previously tx with LN2, L temple within hairline x 3, L arm x 1, R temple within hairline x 4. The patient has spots, moles and lesions to be evaluated, some may be new or changing and the patient may have concern these could be cancer.  The following portions of the chart were reviewed this encounter and updated as appropriate: medications, allergies, medical history  Review of Systems:  No other skin or systemic complaints except as noted in HPI or Assessment and Plan.  Objective  Well appearing patient in no apparent distress; mood and affect are within normal limits. A focused examination was performed of the following areas: the face, L ear, and arms. Relevant exam findings are noted in the Assessment and Plan.  L temple within hair line x 4, R temple within hairline x 8, L arm x 3, R arm x 3, L ear x 1 (19) Erythematous stuck-on, waxy papule or plaque   Assessment & Plan   SEBORRHEIC KERATOSIS - Stuck-on, waxy, tan-brown papules and/or plaques  - Benign-appearing - Discussed benign etiology and prognosis. - Observe - Call for any changes  ACTINIC DAMAGE - chronic, secondary to cumulative UV radiation exposure/sun exposure over time - diffuse scaly erythematous macules with underlying dyspigmentation - Recommend daily broad spectrum sunscreen SPF 30+ to sun-exposed areas, reapply every 2 hours as needed.  - Recommend staying in the shade or wearing long sleeves, sun glasses (UVA+UVB protection) and wide brim hats (4-inch brim around the entire circumference of the hat). - Call for new or changing lesions.  Inflamed seborrheic keratosis (19) L temple within hair line x 4, R temple within hairline x 8, L arm x 3, R arm x 3, L ear x 1  Destruction of lesion - L temple within hair line x 4, R temple within hairline x 8, L arm x 3, R arm x 3, L ear x 1 Complexity:  simple   Destruction method: cryotherapy   Informed consent: discussed and consent obtained   Timeout:  patient name, date of birth, surgical site, and procedure verified Lesion destroyed using liquid nitrogen: Yes   Region frozen until ice ball extended beyond lesion: Yes   Outcome: patient tolerated procedure well with no complications   Post-procedure details: wound care instructions given     Return for appointment as scheduled.  Maylene Roes, CMA, am acting as scribe for Armida Sans, MD .  Documentation: I have reviewed the above documentation for accuracy and completeness, and I agree with the above.  Armida Sans, MD

## 2022-11-28 ENCOUNTER — Other Ambulatory Visit: Payer: PRIVATE HEALTH INSURANCE

## 2022-11-30 ENCOUNTER — Ambulatory Visit: Payer: PRIVATE HEALTH INSURANCE | Admitting: Urology

## 2022-12-02 ENCOUNTER — Other Ambulatory Visit: Payer: Self-pay

## 2022-12-02 DIAGNOSIS — C61 Malignant neoplasm of prostate: Secondary | ICD-10-CM

## 2022-12-03 ENCOUNTER — Encounter: Payer: Self-pay | Admitting: Dermatology

## 2022-12-05 ENCOUNTER — Other Ambulatory Visit: Payer: Managed Care, Other (non HMO)

## 2022-12-05 ENCOUNTER — Other Ambulatory Visit: Payer: Self-pay | Admitting: Urology

## 2022-12-05 DIAGNOSIS — C61 Malignant neoplasm of prostate: Secondary | ICD-10-CM

## 2022-12-06 LAB — PSA: Prostate Specific Ag, Serum: 9.1 ng/mL — ABNORMAL HIGH (ref 0.0–4.0)

## 2022-12-07 ENCOUNTER — Inpatient Hospital Stay (HOSPITAL_BASED_OUTPATIENT_CLINIC_OR_DEPARTMENT_OTHER): Payer: Managed Care, Other (non HMO) | Admitting: Internal Medicine

## 2022-12-07 ENCOUNTER — Inpatient Hospital Stay: Payer: Managed Care, Other (non HMO)

## 2022-12-07 ENCOUNTER — Encounter: Payer: Self-pay | Admitting: Internal Medicine

## 2022-12-07 VITALS — BP 126/71 | HR 59 | Temp 97.2°F | Ht 70.0 in | Wt 172.4 lb

## 2022-12-07 DIAGNOSIS — D473 Essential (hemorrhagic) thrombocythemia: Secondary | ICD-10-CM | POA: Diagnosis not present

## 2022-12-07 LAB — COMPREHENSIVE METABOLIC PANEL
ALT: 23 U/L (ref 0–44)
AST: 27 U/L (ref 15–41)
Albumin: 3.9 g/dL (ref 3.5–5.0)
Alkaline Phosphatase: 61 U/L (ref 38–126)
Anion gap: 8 (ref 5–15)
BUN: 22 mg/dL (ref 8–23)
CO2: 26 mmol/L (ref 22–32)
Calcium: 8.5 mg/dL — ABNORMAL LOW (ref 8.9–10.3)
Chloride: 105 mmol/L (ref 98–111)
Creatinine, Ser: 1.03 mg/dL (ref 0.61–1.24)
GFR, Estimated: >60 mL/min (ref >60)
Glucose, Bld: 80 mg/dL (ref 70–99)
Potassium: 4.5 mmol/L (ref 3.5–5.1)
Sodium: 139 mmol/L (ref 135–145)
Total Bilirubin: 0.5 mg/dL (ref 0.3–1.2)
Total Protein: 6.2 g/dL — ABNORMAL LOW (ref 6.5–8.1)

## 2022-12-07 LAB — CBC WITH DIFFERENTIAL/PLATELET
Abs Immature Granulocytes: 0.07 10*3/uL (ref 0.00–0.07)
Basophils Absolute: 0 10*3/uL (ref 0.0–0.1)
Basophils Relative: 0 %
Eosinophils Absolute: 0 10*3/uL (ref 0.0–0.5)
Eosinophils Relative: 0 %
HCT: 36 % — ABNORMAL LOW (ref 39.0–52.0)
Hemoglobin: 11.5 g/dL — ABNORMAL LOW (ref 13.0–17.0)
Immature Granulocytes: 1 %
Lymphocytes Relative: 30 %
Lymphs Abs: 1.6 10*3/uL (ref 0.7–4.0)
MCH: 31.1 pg (ref 26.0–34.0)
MCHC: 31.9 g/dL (ref 30.0–36.0)
MCV: 97.3 fL (ref 80.0–100.0)
Monocytes Absolute: 0.9 10*3/uL (ref 0.1–1.0)
Monocytes Relative: 17 %
Neutro Abs: 2.7 10*3/uL (ref 1.7–7.7)
Neutrophils Relative %: 52 %
Platelets: 168 10*3/uL (ref 150–400)
RBC: 3.7 MIL/uL — ABNORMAL LOW (ref 4.22–5.81)
RDW: 16.5 % — ABNORMAL HIGH (ref 11.5–15.5)
WBC: 5.2 10*3/uL (ref 4.0–10.5)
nRBC: 1 % — ABNORMAL HIGH (ref 0.0–0.2)

## 2022-12-07 LAB — URIC ACID: Uric Acid, Serum: 4.6 mg/dL (ref 3.7–8.6)

## 2022-12-07 LAB — LACTATE DEHYDROGENASE: LDH: 295 U/L — ABNORMAL HIGH (ref 98–192)

## 2022-12-07 NOTE — Progress Notes (Signed)
Bruising: no Bleeding from gums: no 

## 2022-12-07 NOTE — Addendum Note (Signed)
Addended by: Clydia Llano on: 12/07/2022 04:08 PM   Modules accepted: Orders

## 2022-12-07 NOTE — Assessment & Plan Note (Addendum)
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 FEB 2023-bone marrow biopsy shows 80% hypercellularity; with fibrosis grade 2; no evidence of any blasts or concerns for progression obvious myelofibrosis.  DISCONTINUED anagrelide sec to concerns of myelofibrosis.  # Patient currently on pegylated interferon [[facility]; dr.Reeves]-on a weekly basis.  Platelet counts consistently in the 1 60-170 range.  Hemoglobin 12 white count normal. Continue aspirin 81mg  po daily.  Defer to [facility] regarding titration of pegylated interferon.  # Hypocalcemia: 8.5- recommend ca+ vit D OTC. Check vit D 25- OH levels.   # History of gout- left big toe-allopurinol [s/p rheumatology].  Stable.  # Elevated PSA/prostate cancer ~ [Dr.Stoiff]- MRI/[facility]- currently on surveillance-slow rise noted May 2024 PSA 9.  Awaiting appointment with urology next week.  # DISPOSITION:  # labs- CBC/CMP q 3 weeks x4 # Follow-up in 4 months; MD; labs-CBC CMP/LDH/IURIC acid; vit D 25 OH levels; TSH - Dr.B  Cc:   Dr. Sullivan Lone.

## 2022-12-07 NOTE — Progress Notes (Signed)
[facility] OFFICE PROGRESS NOTE  Patient Care Team: Jacky Kindle, FNP as PCP - General (Family Medicine) Earna Coder, MD as Consulting Physician (Hematology and Oncology)   SUMMARY OF HEMATOLOGIC/ONCOLOGIC HISTORY: Oncology History Overview Note  # 2005- Essential thrombocythemia [Bone marrow study on December 24, 2003 showed increased enlarged megakaryocytes, cellularity 50%, consistent with ET [Dr.Pandit]; Cytogenetics, flow study nd BCR/ABL study negative.January 2007: [id number] mutation negative] On Anagrelide therapy Asa 81mg /d; CALR MUTATION POSITIVE [Nov 2017];   #FEB 2023 [July 2022-peripheral blood  flowcytometry-2%blasts/on prednisone/acute gout attack]-bone marrow biopsy shows 80% hypercellularity; with fibrosis grade 2; no evidence of any blasts or concerns for progression obvious myelofibrosis.  Patient continues to be asymptomatic.  LDH slightly elevated.    # MAY 2023-second opinion-[facility] Dr. Anise Salvo; DISCONTINUED anagrelide [secondary to concern for bone marrow fibrosis]; rising LDH; on aspirin only   DIAGNOSIS: ET    Essential thrombocytosis (HCC)   INTERVAL HISTORY: Ambulating independently.  Alone.  Patient 42 very pleasant male who returns to clinic for follow-up of essential thrombocytosis, CALR mutation, with concerns for myelofibrosis-currently on aspirin and peg interfon alfa [Dr.Reeves; [facility]] lone is here for follow-up.  Patient denies any bleeding. Patient denies any fatigue.  Denies any myalgias.  Denies any shortness of breath or weight loss or night sweats.   Review of Systems  Constitutional:  Negative for chills, diaphoresis, fever, malaise/fatigue and weight loss.  HENT:  Negative for nosebleeds and sore throat.   Eyes:  Negative for double vision.  Respiratory:  Negative for cough, hemoptysis, sputum production, shortness of breath and wheezing.   Cardiovascular:  Negative for chest pain, palpitations, orthopnea and leg swelling.   Gastrointestinal:  Negative for abdominal pain, blood in stool, constipation, diarrhea, heartburn, melena, nausea and vomiting.  Genitourinary:  Negative for dysuria, frequency and urgency.  Musculoskeletal:  Negative for back pain and joint pain.  Skin: Negative.  Negative for itching and rash.  Neurological:  Negative for dizziness, tingling, focal weakness, weakness and headaches.  Endo/Heme/Allergies:  Does not bruise/bleed easily.  Psychiatric/Behavioral:  Negative for depression. The patient is not nervous/anxious and does not have insomnia.    PAST MEDICAL HISTORY :  Past Medical History:  Diagnosis Date   Arthritis    osteoartiritis   Basal cell carcinoma 10/12/2009   Left post. shoulder sup. lateral scapula, 3cm med. to hemangioma.    Basal cell carcinoma 02/26/2015   Right temple. Nodular.   Basal cell carcinoma 03/21/2019   Right inf. med. pectoral. Nodular. EDC   Basal cell carcinoma 03/21/2019   Left lat. abdomen parallel lat. to umbilicus. Micronodular. EDC.   Dysplastic nevus 01/28/2008   Left upper back medial sup scapula. Moderate to severe atypia, margins involved. Excised 03/04/2008, persistent DN, close to margin.   Dysplastic nevus 07/01/2010   Left low back paraspinal lat. Mild atypiua, margins involved.    Dysplastic nevus 07/01/2010   Left low back paraspinal me. Mild atypia, margins involved.    Gout    High platelet count    Prostate cancer (HCC)    per pt    PAST SURGICAL HISTORY :   Past Surgical History:  Procedure Laterality Date   BONE MARROW BIOPSY     COLONOSCOPY  2016   CYSTECTOMY  years ago   pilonidal cyst removed   KNEE SURGERY Right 20 years ago   arthrocscopy   TOTAL KNEE ARTHROPLASTY Right 08/15/2016   Procedure: RIGHT TOTAL KNEE ARTHROPLASTY;  Surgeon: Ollen Gross, MD;  Location: [facility];  Service: Orthopedics;  Laterality: Right;   FAMILY HISTORY :  History reviewed. No pertinent family history.  SOCIAL HISTORY:   Social  History   Tobacco Use   Smoking status: Never   Smokeless tobacco: Never  Vaping Use   Vaping Use: Never used  Substance Use Topics   Alcohol use: Yes    Alcohol/week: 3.0 - 7.0 standard drinks of alcohol    Types: 3 - 7 Glasses of wine per week   Drug use: No    ALLERGIES:  is allergic to penicillins.  MEDICATIONS:  Current Outpatient Medications  Medication Sig Dispense Refill   allopurinol (ZYLOPRIM) 100 MG tablet Take 300 mg by mouth daily.     aspirin EC 81 MG tablet Take 1 tablet by mouth daily.     peginterferon alfa-2a (PEGASYS) 180 MCG/ML injection Inject 180 mcg into the skin every 7 (seven) days.     tamsulosin (FLOMAX) 0.4 MG CAPS capsule TAKE 1 CAPSULE BY MOUTH EVERY DAY 90 capsule 3   omeprazole (PRILOSEC) 20 MG capsule Take 1 capsule (20 mg total) by mouth daily. 30 capsule 3   No current facility-administered medications for this visit.    PHYSICAL EXAMINATION: ECOG PERFORMANCE STATUS: 0 - Asymptomatic  BP 126/71 (BP Location: Left Arm, Patient Position: Sitting, Cuff Size: Normal)   Pulse (!) 59   Temp (!) 97.2 F (36.2 C) (Tympanic)   Ht 5\' 10"  (1.778 m)   Wt 172 lb 6.4 oz (78.2 kg)   SpO2 100%   BMI 24.74 kg/m   Filed Weights   12/07/22 1508  Weight: 172 lb 6.4 oz (78.2 kg)     Physical Exam Constitutional:      Appearance: He is not ill-appearing.  Eyes:     General: No scleral icterus.    Conjunctiva/sclera: Conjunctivae normal.  Cardiovascular:     Rate and Rhythm: Normal rate and regular rhythm.  Abdominal:     General: There is no distension.     Palpations: Abdomen is soft.     Tenderness: There is no abdominal tenderness. There is no guarding.  Musculoskeletal:        General: No deformity.     Right lower leg: No edema.     Left lower leg: No edema.  Lymphadenopathy:     Cervical: No cervical adenopathy.  Skin:    General: Skin is warm and dry.  Neurological:     Mental Status: He is alert and oriented to person, place,  and time. Mental status is at baseline.  Psychiatric:        Mood and Affect: Mood normal.        Behavior: Behavior normal.     LABORATORY DATA:  I have reviewed the data as listed    Component Value Date/Time   NA 139 12/07/2022 1506   NA 142 07/27/2022 0854   K 4.5 12/07/2022 1506   CL 105 12/07/2022 1506   CO2 26 12/07/2022 1506   GLUCOSE 80 12/07/2022 1506   BUN 22 12/07/2022 1506   BUN 7 (L) 07/27/2022 0854   CREATININE 1.03 12/07/2022 1506   CREATININE 0.93 11/24/2022 0903   CREATININE 1.10 04/25/2014 0823   CALCIUM 8.5 (L) 12/07/2022 1506   PROT 6.2 (L) 12/07/2022 1506   PROT 6.2 07/27/2022 0854   PROT 6.7 04/25/2014 0823   ALBUMIN 3.9 12/07/2022 1506   ALBUMIN 4.2 07/27/2022 0854   ALBUMIN 3.8 04/25/2014 0823   AST 27 12/07/2022 1506  AST 34 11/24/2022 0903   ALT 23 12/07/2022 1506   ALT 26 11/24/2022 0903   ALT 26 04/25/2014 0823   ALKPHOS 61 12/07/2022 1506   ALKPHOS 44 (L) 04/25/2014 0823   BILITOT 0.5 12/07/2022 1506   BILITOT 0.6 11/24/2022 0903   GFRNONAA >60 12/07/2022 1506   GFRNONAA >60 11/24/2022 0903   GFRNONAA >60 04/25/2014 0823   GFRNONAA >60 02/04/2014 0830   GFRAA >60 12/25/2019 1324   GFRAA >60 04/25/2014 0823   GFRAA >60 02/04/2014 0830   No results found for: "SPEP", "UPEP"  Lab Results  Component Value Date   WBC 5.2 12/07/2022   NEUTROABS 2.7 12/07/2022   HGB 11.5 (L) 12/07/2022   HCT 36.0 (L) 12/07/2022   MCV 97.3 12/07/2022   PLT 168 12/07/2022    ASSESSMENT & PLAN:   Essential thrombocytosis (HCC) #1 Essential thrombocythemia,  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 FEB 2023-bone marrow biopsy shows 80% hypercellularity; with fibrosis grade 2; no evidence of any blasts or concerns for progression obvious myelofibrosis.  DISCONTINUED anagrelide sec to concerns of myelofibrosis.  # Patient currently on pegylated interferon [[facility]; dr.Reeves]-on a weekly basis.  Platelet counts  consistently in the 1 60-170 range.  Hemoglobin 12 white count normal. Continue aspirin 81mg  po daily.  Defer to [facility] regarding titration of pegylated interferon.  # Hypocalcemia: 8.5- recommend ca+ vit D OTC. Check vit D 25- OH levels.   # History of gout- left big toe-allopurinol [s/p rheumatology].  Stable.  # Elevated PSA/prostate cancer ~ [Dr.Stoiff]- MRI/[facility]- currently on surveillance-slow rise noted May 2024 PSA 9.  Awaiting appointment with urology next week.  # DISPOSITION:  # labs- CBC/CMP q 3 weeks x4 # Follow-up in 4 months; MD; labs-CBC CMP/LDH/IURIC acid; vit D 25 OH levels; TSH - Dr.B  Cc:   Dr. Sullivan Lone.      Earna Coder, MD 12/07/2022 3:56 PM

## 2022-12-09 ENCOUNTER — Other Ambulatory Visit: Payer: Self-pay

## 2022-12-09 ENCOUNTER — Ambulatory Visit: Payer: Self-pay | Admitting: Internal Medicine

## 2022-12-13 ENCOUNTER — Ambulatory Visit: Payer: 59 | Admitting: Internal Medicine

## 2022-12-13 ENCOUNTER — Other Ambulatory Visit: Payer: 59

## 2022-12-15 ENCOUNTER — Encounter: Payer: Self-pay | Admitting: Urology

## 2022-12-15 ENCOUNTER — Ambulatory Visit (INDEPENDENT_AMBULATORY_CARE_PROVIDER_SITE_OTHER): Payer: Managed Care, Other (non HMO) | Admitting: Urology

## 2022-12-15 VITALS — BP 133/79 | HR 70 | Ht 70.0 in | Wt 170.0 lb

## 2022-12-15 DIAGNOSIS — C61 Malignant neoplasm of prostate: Secondary | ICD-10-CM | POA: Diagnosis not present

## 2022-12-15 NOTE — Patient Instructions (Signed)

## 2022-12-15 NOTE — Progress Notes (Signed)
Marcelle Overlie Plume,acting as a scribe for Riki Altes, MD.,have documented all relevant documentation on the behalf of Riki Altes, MD,as directed by  Riki Altes, MD while in the presence of Riki Altes, MD.  12/15/2022 3:43 PM   Anthony Graves 1960/12/25 [id number]  Referring provider: Bosie Clos, MD [address] [place],  [place] [address]  Chief Complaint  Patient presents with   Follow-up    HPI: Anthony Graves is a 62 y.o. male who presents for follow up for prostate cancer.  Prostate biopsy at [facility] in 03/2021 showed 4 cores of Gleason 3+3 adenocarcinoma and he elected active surveillance Confirmatory biopsy performed at [facility] on 06/20/2022 showed 2 cores positive for Gleason 3+3 adenocarcinoma on a ROI biopsy and 1 core Gleason 3+3 (5%) at the left apex on standard template biopsy PSA 01/2021 was 7.2 PSA 12/05/2022 was 9.1   PMH: Past Medical History:  Diagnosis Date   Arthritis    osteoartiritis   Basal cell carcinoma 10/12/2009   Left post. shoulder sup. lateral scapula, 3cm med. to hemangioma.    Basal cell carcinoma 02/26/2015   Right temple. Nodular.   Basal cell carcinoma 03/21/2019   Right inf. med. pectoral. Nodular. EDC   Basal cell carcinoma 03/21/2019   Left lat. abdomen parallel lat. to umbilicus. Micronodular. EDC.   Dysplastic nevus 01/28/2008   Left upper back medial sup scapula. Moderate to severe atypia, margins involved. Excised 03/04/2008, persistent DN, close to margin.   Dysplastic nevus 07/01/2010   Left low back paraspinal lat. Mild atypiua, margins involved.    Dysplastic nevus 07/01/2010   Left low back paraspinal me. Mild atypia, margins involved.    Gout    High platelet count    Prostate cancer (HCC)    per pt    Surgical History: Past Surgical History:  Procedure Laterality Date   BONE MARROW BIOPSY     COLONOSCOPY  2016   CYSTECTOMY  years ago   pilonidal cyst removed   KNEE SURGERY Right 20 years ago    arthrocscopy   TOTAL KNEE ARTHROPLASTY Right 08/15/2016   Procedure: RIGHT TOTAL KNEE ARTHROPLASTY;  Surgeon: Ollen Gross, MD;  Location: [facility];  Service: Orthopedics;  Laterality: Right;    Home Medications:  Allergies as of 12/15/2022       Reactions   Penicillins    Reaction as a child Has patient had a PCN reaction causing immediate rash, facial/tongue/throat swelling, SOB or lightheadedness with hypotension:unsure Has patient had a PCN reaction causing severe rash involving mucus membranes or skin necrosis:unsure Has patient had a PCN reaction that required hospitalization:No Has patient had a PCN reaction occurring within the last 10 years: No If all of the above answers are "NO", then may proceed with Cephalosporin use.        Medication List        Accurate as of Dec 15, 2022  3:43 PM. If you have any questions, ask your nurse or doctor.          STOP taking these medications    omeprazole 20 MG capsule Commonly known as: PRILOSEC Stopped by: Riki Altes, MD       TAKE these medications    allopurinol 100 MG tablet Commonly known as: ZYLOPRIM Take 300 mg by mouth daily.   aspirin EC 81 MG tablet Take 1 tablet by mouth daily.   peginterferon alfa-2a 180 MCG/ML injection Commonly known as: PEGASYS Inject  180 mcg into the skin every 7 (seven) days.   tamsulosin 0.4 MG Caps capsule Commonly known as: FLOMAX TAKE 1 CAPSULE BY MOUTH EVERY DAY        Allergies:  Allergies  Allergen Reactions   Penicillins     Reaction as a child Has patient had a PCN reaction causing immediate rash, facial/tongue/throat swelling, SOB or lightheadedness with hypotension:unsure Has patient had a PCN reaction causing severe rash involving mucus membranes or skin necrosis:unsure Has patient had a PCN reaction that required hospitalization:No Has patient had a PCN reaction occurring within the last 10 years: No If all of the above answers are "NO", then may  proceed with Cephalosporin use.     Social History:  reports that he has never smoked. He has never used smokeless tobacco. He reports current alcohol use of about 3.0 - 7.0 standard drinks of alcohol per week. He reports that he does not use drugs.   Physical Exam: BP 133/79   Pulse 70   Ht 5\' 10"  (1.778 m)   Wt 170 lb (77.1 kg)   BMI 24.39 kg/m   Constitutional:  Alert and oriented, No acute distress. HEENT: [place] AT, moist mucus membranes.  Trachea midline, no masses. Cardiovascular: No clubbing, cyanosis, or edema. Respiratory: Normal respiratory effort, no increased work of breathing. GI: Abdomen is soft, nontender, nondistended, no abdominal masses GU: 40 gram prostate. Smooth without nodules Skin: No rashes, bruises or suspicious lesions. Neurologic: Grossly intact, no focal deficits, moving all 4 extremities. Psychiatric: Normal mood and affect  Assessment & Plan:    1. Prostate cancer T1c low risk with confirmatory biopsy showing low risk disease Slight PSA bump, 9.1. We discussed this may be due to transient inflammation based on his prostate size. Recommend follow up lab visit in 2 months for a repeat PSA and if PSA is persistently elevated, a repeat prostate MRI at that time  I have reviewed the above documentation for accuracy and completeness, and I agree with the above.   Riki Altes, MD  [facility] [address], [address] [place], [place] [address] [phone]

## 2022-12-27 ENCOUNTER — Inpatient Hospital Stay: Payer: Managed Care, Other (non HMO) | Attending: Internal Medicine

## 2022-12-27 DIAGNOSIS — D473 Essential (hemorrhagic) thrombocythemia: Secondary | ICD-10-CM | POA: Insufficient documentation

## 2022-12-27 LAB — CBC WITH DIFFERENTIAL (CANCER CENTER ONLY)
Abs Immature Granulocytes: 0.04 10*3/uL (ref 0.00–0.07)
Basophils Absolute: 0 10*3/uL (ref 0.0–0.1)
Basophils Relative: 0 %
Eosinophils Absolute: 0 10*3/uL (ref 0.0–0.5)
Eosinophils Relative: 0 %
HCT: 37.1 % — ABNORMAL LOW (ref 39.0–52.0)
Hemoglobin: 11.7 g/dL — ABNORMAL LOW (ref 13.0–17.0)
Immature Granulocytes: 1 %
Lymphocytes Relative: 29 %
Lymphs Abs: 1.4 10*3/uL (ref 0.7–4.0)
MCH: 30.7 pg (ref 26.0–34.0)
MCHC: 31.5 g/dL (ref 30.0–36.0)
MCV: 97.4 fL (ref 80.0–100.0)
Monocytes Absolute: 0.8 10*3/uL (ref 0.1–1.0)
Monocytes Relative: 17 %
Neutro Abs: 2.5 10*3/uL (ref 1.7–7.7)
Neutrophils Relative %: 53 %
Platelet Count: 134 10*3/uL — ABNORMAL LOW (ref 150–400)
RBC: 3.81 MIL/uL — ABNORMAL LOW (ref 4.22–5.81)
RDW: 15.9 % — ABNORMAL HIGH (ref 11.5–15.5)
WBC Count: 4.8 10*3/uL (ref 4.0–10.5)
nRBC: 0.4 % — ABNORMAL HIGH (ref 0.0–0.2)

## 2022-12-27 LAB — CMP (CANCER CENTER ONLY)
ALT: 30 U/L (ref 0–44)
AST: 34 U/L (ref 15–41)
Albumin: 3.6 g/dL (ref 3.5–5.0)
Alkaline Phosphatase: 61 U/L (ref 38–126)
Anion gap: 5 (ref 5–15)
BUN: 15 mg/dL (ref 8–23)
CO2: 27 mmol/L (ref 22–32)
Calcium: 8.4 mg/dL — ABNORMAL LOW (ref 8.9–10.3)
Chloride: 107 mmol/L (ref 98–111)
Creatinine: 0.93 mg/dL (ref 0.61–1.24)
GFR, Estimated: >60 mL/min (ref >60)
Glucose, Bld: 138 mg/dL — ABNORMAL HIGH (ref 70–99)
Potassium: 4.4 mmol/L (ref 3.5–5.1)
Sodium: 139 mmol/L (ref 135–145)
Total Bilirubin: 0.7 mg/dL (ref 0.3–1.2)
Total Protein: 6.1 g/dL — ABNORMAL LOW (ref 6.5–8.1)

## 2023-01-17 ENCOUNTER — Inpatient Hospital Stay: Payer: Managed Care, Other (non HMO) | Attending: Internal Medicine

## 2023-01-17 ENCOUNTER — Other Ambulatory Visit: Payer: Self-pay | Admitting: *Deleted

## 2023-01-17 ENCOUNTER — Encounter: Payer: Self-pay | Admitting: Internal Medicine

## 2023-01-17 DIAGNOSIS — D72828 Other elevated white blood cell count: Secondary | ICD-10-CM

## 2023-01-17 DIAGNOSIS — D473 Essential (hemorrhagic) thrombocythemia: Secondary | ICD-10-CM | POA: Insufficient documentation

## 2023-01-17 DIAGNOSIS — R972 Elevated prostate specific antigen [PSA]: Secondary | ICD-10-CM | POA: Insufficient documentation

## 2023-02-07 ENCOUNTER — Inpatient Hospital Stay: Payer: Managed Care, Other (non HMO)

## 2023-02-08 ENCOUNTER — Encounter: Payer: Self-pay | Admitting: Urology

## 2023-02-08 ENCOUNTER — Other Ambulatory Visit: Payer: Self-pay | Admitting: *Deleted

## 2023-02-08 ENCOUNTER — Encounter: Payer: Self-pay | Admitting: Internal Medicine

## 2023-02-08 DIAGNOSIS — D473 Essential (hemorrhagic) thrombocythemia: Secondary | ICD-10-CM

## 2023-02-08 DIAGNOSIS — C61 Malignant neoplasm of prostate: Secondary | ICD-10-CM

## 2023-02-13 ENCOUNTER — Inpatient Hospital Stay: Payer: Managed Care, Other (non HMO)

## 2023-02-13 DIAGNOSIS — D72828 Other elevated white blood cell count: Secondary | ICD-10-CM

## 2023-02-13 DIAGNOSIS — R972 Elevated prostate specific antigen [PSA]: Secondary | ICD-10-CM | POA: Diagnosis not present

## 2023-02-13 DIAGNOSIS — D473 Essential (hemorrhagic) thrombocythemia: Secondary | ICD-10-CM

## 2023-02-13 DIAGNOSIS — C61 Malignant neoplasm of prostate: Secondary | ICD-10-CM

## 2023-02-13 LAB — CMP (CANCER CENTER ONLY)
ALT: 24 U/L (ref 0–44)
AST: 30 U/L (ref 15–41)
Albumin: 3.3 g/dL — ABNORMAL LOW (ref 3.5–5.0)
Alkaline Phosphatase: 59 U/L (ref 38–126)
Anion gap: 8 (ref 5–15)
BUN: 22 mg/dL (ref 8–23)
CO2: 26 mmol/L (ref 22–32)
Calcium: 9 mg/dL (ref 8.9–10.3)
Chloride: 103 mmol/L (ref 98–111)
Creatinine: 0.91 mg/dL (ref 0.61–1.24)
GFR, Estimated: >60 mL/min (ref >60)
Glucose, Bld: 108 mg/dL — ABNORMAL HIGH (ref 70–99)
Potassium: 4.6 mmol/L (ref 3.5–5.1)
Sodium: 137 mmol/L (ref 135–145)
Total Bilirubin: 0.7 mg/dL (ref 0.3–1.2)
Total Protein: 6.4 g/dL — ABNORMAL LOW (ref 6.5–8.1)

## 2023-02-13 LAB — CBC WITH DIFFERENTIAL (CANCER CENTER ONLY)
Abs Immature Granulocytes: 0.05 10*3/uL (ref 0.00–0.07)
Basophils Absolute: 0 10*3/uL (ref 0.0–0.1)
Basophils Relative: 0 %
Eosinophils Absolute: 0 10*3/uL (ref 0.0–0.5)
Eosinophils Relative: 0 %
HCT: 39.6 % (ref 39.0–52.0)
Hemoglobin: 12.7 g/dL — ABNORMAL LOW (ref 13.0–17.0)
Immature Granulocytes: 1 %
Lymphocytes Relative: 27 %
Lymphs Abs: 1.5 10*3/uL (ref 0.7–4.0)
MCH: 30.8 pg (ref 26.0–34.0)
MCHC: 32.1 g/dL (ref 30.0–36.0)
MCV: 95.9 fL (ref 80.0–100.0)
Monocytes Absolute: 0.8 10*3/uL (ref 0.1–1.0)
Monocytes Relative: 15 %
Neutro Abs: 3.1 10*3/uL (ref 1.7–7.7)
Neutrophils Relative %: 57 %
Platelet Count: 168 10*3/uL (ref 150–400)
RBC: 4.13 MIL/uL — ABNORMAL LOW (ref 4.22–5.81)
RDW: 15 % (ref 11.5–15.5)
WBC Count: 5.5 10*3/uL (ref 4.0–10.5)
nRBC: 0 % (ref 0.0–0.2)

## 2023-02-13 LAB — PSA: Prostatic Specific Antigen: 9.45 ng/mL — ABNORMAL HIGH (ref 0.00–4.00)

## 2023-02-14 ENCOUNTER — Encounter: Payer: Self-pay | Admitting: Urology

## 2023-02-14 ENCOUNTER — Other Ambulatory Visit: Payer: Managed Care, Other (non HMO)

## 2023-02-20 ENCOUNTER — Other Ambulatory Visit: Payer: Self-pay | Admitting: Urology

## 2023-02-20 DIAGNOSIS — C61 Malignant neoplasm of prostate: Secondary | ICD-10-CM

## 2023-02-28 ENCOUNTER — Other Ambulatory Visit: Payer: Self-pay | Admitting: Urology

## 2023-02-28 ENCOUNTER — Inpatient Hospital Stay: Payer: Managed Care, Other (non HMO)

## 2023-03-13 ENCOUNTER — Inpatient Hospital Stay: Payer: Managed Care, Other (non HMO)

## 2023-03-14 ENCOUNTER — Inpatient Hospital Stay: Payer: Managed Care, Other (non HMO) | Attending: Internal Medicine

## 2023-03-14 DIAGNOSIS — D473 Essential (hemorrhagic) thrombocythemia: Secondary | ICD-10-CM | POA: Insufficient documentation

## 2023-03-14 DIAGNOSIS — D72828 Other elevated white blood cell count: Secondary | ICD-10-CM

## 2023-03-14 LAB — CBC WITH DIFFERENTIAL (CANCER CENTER ONLY)
Abs Immature Granulocytes: 0.05 10*3/uL (ref 0.00–0.07)
Basophils Absolute: 0 10*3/uL (ref 0.0–0.1)
Basophils Relative: 0 %
Eosinophils Absolute: 0 10*3/uL (ref 0.0–0.5)
Eosinophils Relative: 0 %
HCT: 39 % (ref 39.0–52.0)
Hemoglobin: 12.6 g/dL — ABNORMAL LOW (ref 13.0–17.0)
Immature Granulocytes: 1 %
Lymphocytes Relative: 20 %
Lymphs Abs: 1.2 10*3/uL (ref 0.7–4.0)
MCH: 31.2 pg (ref 26.0–34.0)
MCHC: 32.3 g/dL (ref 30.0–36.0)
MCV: 96.5 fL (ref 80.0–100.0)
Monocytes Absolute: 0.9 10*3/uL (ref 0.1–1.0)
Monocytes Relative: 15 %
Neutro Abs: 3.8 10*3/uL (ref 1.7–7.7)
Neutrophils Relative %: 64 %
Platelet Count: 155 10*3/uL (ref 150–400)
RBC: 4.04 MIL/uL — ABNORMAL LOW (ref 4.22–5.81)
RDW: 14.3 % (ref 11.5–15.5)
WBC Count: 6.1 10*3/uL (ref 4.0–10.5)
nRBC: 0 % (ref 0.0–0.2)

## 2023-03-14 LAB — CMP (CANCER CENTER ONLY)
ALT: 21 U/L (ref 0–44)
AST: 29 U/L (ref 15–41)
Albumin: 3.9 g/dL (ref 3.5–5.0)
Alkaline Phosphatase: 61 U/L (ref 38–126)
Anion gap: 7 (ref 5–15)
BUN: 14 mg/dL (ref 8–23)
CO2: 26 mmol/L (ref 22–32)
Calcium: 8.3 mg/dL — ABNORMAL LOW (ref 8.9–10.3)
Chloride: 103 mmol/L (ref 98–111)
Creatinine: 0.96 mg/dL (ref 0.61–1.24)
GFR, Estimated: >60 mL/min (ref >60)
Glucose, Bld: 105 mg/dL — ABNORMAL HIGH (ref 70–99)
Potassium: 4.4 mmol/L (ref 3.5–5.1)
Sodium: 136 mmol/L (ref 135–145)
Total Bilirubin: 0.8 mg/dL (ref 0.3–1.2)
Total Protein: 6.4 g/dL — ABNORMAL LOW (ref 6.5–8.1)

## 2023-04-14 ENCOUNTER — Inpatient Hospital Stay (HOSPITAL_BASED_OUTPATIENT_CLINIC_OR_DEPARTMENT_OTHER): Payer: Managed Care, Other (non HMO) | Admitting: Internal Medicine

## 2023-04-14 ENCOUNTER — Inpatient Hospital Stay: Payer: Managed Care, Other (non HMO) | Attending: Internal Medicine

## 2023-04-14 ENCOUNTER — Encounter: Payer: Self-pay | Admitting: Internal Medicine

## 2023-04-14 VITALS — Ht 70.0 in

## 2023-04-14 DIAGNOSIS — D473 Essential (hemorrhagic) thrombocythemia: Secondary | ICD-10-CM | POA: Insufficient documentation

## 2023-04-14 DIAGNOSIS — Z7982 Long term (current) use of aspirin: Secondary | ICD-10-CM | POA: Diagnosis not present

## 2023-04-14 DIAGNOSIS — Z79620 Long term (current) use of immunosuppressive biologic: Secondary | ICD-10-CM | POA: Diagnosis not present

## 2023-04-14 DIAGNOSIS — M109 Gout, unspecified: Secondary | ICD-10-CM | POA: Insufficient documentation

## 2023-04-14 LAB — CBC WITH DIFFERENTIAL (CANCER CENTER ONLY)
Abs Immature Granulocytes: 0.06 10*3/uL (ref 0.00–0.07)
Basophils Absolute: 0 10*3/uL (ref 0.0–0.1)
Basophils Relative: 0 %
Eosinophils Absolute: 0 10*3/uL (ref 0.0–0.5)
Eosinophils Relative: 1 %
HCT: 40.8 % (ref 39.0–52.0)
Hemoglobin: 12.9 g/dL — ABNORMAL LOW (ref 13.0–17.0)
Immature Granulocytes: 1 %
Lymphocytes Relative: 19 %
Lymphs Abs: 1.3 10*3/uL (ref 0.7–4.0)
MCH: 31.2 pg (ref 26.0–34.0)
MCHC: 31.6 g/dL (ref 30.0–36.0)
MCV: 98.8 fL (ref 80.0–100.0)
Monocytes Absolute: 0.7 10*3/uL (ref 0.1–1.0)
Monocytes Relative: 10 %
Neutro Abs: 4.9 10*3/uL (ref 1.7–7.7)
Neutrophils Relative %: 69 %
Platelet Count: 224 10*3/uL (ref 150–400)
RBC: 4.13 MIL/uL — ABNORMAL LOW (ref 4.22–5.81)
RDW: 14.7 % (ref 11.5–15.5)
WBC Count: 7 10*3/uL (ref 4.0–10.5)
nRBC: 0 % (ref 0.0–0.2)

## 2023-04-14 LAB — CMP (CANCER CENTER ONLY)
ALT: 21 U/L (ref 0–44)
AST: 25 U/L (ref 15–41)
Albumin: 3.8 g/dL (ref 3.5–5.0)
Alkaline Phosphatase: 57 U/L (ref 38–126)
Anion gap: 9 (ref 5–15)
BUN: 19 mg/dL (ref 8–23)
CO2: 27 mmol/L (ref 22–32)
Calcium: 8.9 mg/dL (ref 8.9–10.3)
Chloride: 103 mmol/L (ref 98–111)
Creatinine: 1.03 mg/dL (ref 0.61–1.24)
GFR, Estimated: >60 mL/min
Glucose, Bld: 94 mg/dL (ref 70–99)
Potassium: 3.9 mmol/L (ref 3.5–5.1)
Sodium: 139 mmol/L (ref 135–145)
Total Bilirubin: 0.9 mg/dL (ref 0.3–1.2)
Total Protein: 6.5 g/dL (ref 6.5–8.1)

## 2023-04-14 LAB — LACTATE DEHYDROGENASE: LDH: 203 U/L — ABNORMAL HIGH (ref 98–192)

## 2023-04-14 LAB — URIC ACID: Uric Acid, Serum: 5 mg/dL (ref 3.7–8.6)

## 2023-04-14 LAB — VITAMIN D 25 HYDROXY (VIT D DEFICIENCY, FRACTURES): Vit D, 25-Hydroxy: 44.63 ng/mL (ref 30–100)

## 2023-04-14 LAB — TSH: TSH: 5.862 u[IU]/mL — ABNORMAL HIGH (ref 0.350–4.500)

## 2023-04-14 NOTE — Progress Notes (Signed)
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  Patient continues to be asymptomatic.  LDH slightly elevated.    # MAY 2023-second opinion-[facility] Dr. Anise Salvo; DISCONTINUED anagrelide [secondary to concern for bone marrow fibrosis]; rising LDH; on aspirin only   DIAGNOSIS: ET    Essential thrombocytosis (HCC)   INTERVAL HISTORY: Ambulating independently.  Alone.  Patient 46 very pleasant male who returns to clinic for follow-up of essential thrombocytosis, CALR mutation, with concerns for myelofibrosis-currently on aspirin and peg interfon alfa [Dr.Reeves; [facility]] lone is here for follow-up.  Patient is doing well in the interim. No side effects from any medications. Appetite and energy are normal. Denies any pain..   Patient denies any bleeding. Patient denies any fatigue.  Denies any myalgias.  Denies any shortness of breath or weight loss or night sweats.   Review of Systems  Constitutional:  Negative for chills, diaphoresis, fever, malaise/fatigue and weight loss.  HENT:  Negative for nosebleeds and sore throat.   Eyes:  Negative for double vision.  Respiratory:  Negative for cough, hemoptysis, sputum  production, shortness of breath and wheezing.   Cardiovascular:  Negative for chest pain, palpitations, orthopnea and leg swelling.  Gastrointestinal:  Negative for abdominal pain, blood in stool, constipation, diarrhea, heartburn, melena, nausea and vomiting.  Genitourinary:  Negative for dysuria, frequency and urgency.  Musculoskeletal:  Negative for back pain and joint pain.  Skin: Negative.  Negative for itching and rash.  Neurological:  Negative for dizziness, tingling, focal weakness, weakness and headaches.  Endo/Heme/Allergies:  Does not bruise/bleed easily.  Psychiatric/Behavioral:  Negative for depression. The patient is not nervous/anxious and does not have insomnia.    PAST MEDICAL HISTORY :  Past Medical History:  Diagnosis Date   Arthritis    osteoartiritis   Basal cell carcinoma 10/12/2009   Left post. shoulder sup. lateral scapula, 3cm med. to hemangioma.    Basal cell carcinoma 02/26/2015   Right temple. Nodular.   Basal cell carcinoma 03/21/2019   Right inf. med. pectoral. Nodular. EDC   Basal cell carcinoma 03/21/2019   Left lat. abdomen parallel lat. to umbilicus. Micronodular. EDC.   Dysplastic nevus 01/28/2008   Left upper back medial sup scapula. Moderate to severe atypia, margins involved. Excised 03/04/2008, persistent DN, close to margin.   Dysplastic nevus 07/01/2010   Left low back paraspinal lat. Mild atypiua, margins involved.    Dysplastic nevus 07/01/2010   Left low back paraspinal me. Mild atypia, margins involved.    Gout    High platelet count    Prostate cancer (HCC)    per pt    PAST SURGICAL HISTORY :   Past Surgical History:  Procedure Laterality Date   BONE MARROW BIOPSY     COLONOSCOPY  2016   CYSTECTOMY  years ago   pilonidal cyst removed   KNEE SURGERY Right 20 years ago  arthrocscopy   TOTAL KNEE ARTHROPLASTY Right 08/15/2016   Procedure: RIGHT TOTAL KNEE ARTHROPLASTY;  Surgeon: Ollen Gross, MD;  Location: [facility];  Service:  Orthopedics;  Laterality: Right;   FAMILY HISTORY :  History reviewed. No pertinent family history.  SOCIAL HISTORY:   Social History   Tobacco Use   Smoking status: Never   Smokeless tobacco: Never  Vaping Use   Vaping status: Never Used  Substance Use Topics   Alcohol use: Yes    Alcohol/week: 3.0 - 7.0 standard drinks of alcohol    Types: 3 - 7 Glasses of wine per week   Drug use: No    ALLERGIES:  is allergic to penicillins.  MEDICATIONS:  Current Outpatient Medications  Medication Sig Dispense Refill   allopurinol (ZYLOPRIM) 100 MG tablet Take 300 mg by mouth daily.     aspirin EC 81 MG tablet Take 1 tablet by mouth daily.     peginterferon alfa-2a (PEGASYS) 180 MCG/ML injection Inject 180 mcg into the skin every 14 (fourteen) days.     tamsulosin (FLOMAX) 0.4 MG CAPS capsule TAKE 1 CAPSULE BY MOUTH EVERY DAY 90 capsule 3   No current facility-administered medications for this visit.    PHYSICAL EXAMINATION: ECOG PERFORMANCE STATUS: 0 - Asymptomatic  Ht 5\' 10"  (1.778 m)   BMI 24.39 kg/m   There were no vitals filed for this visit.    Physical Exam Constitutional:      Appearance: He is not ill-appearing.  Eyes:     General: No scleral icterus.    Conjunctiva/sclera: Conjunctivae normal.  Cardiovascular:     Rate and Rhythm: Normal rate and regular rhythm.  Abdominal:     General: There is no distension.     Palpations: Abdomen is soft.     Tenderness: There is no abdominal tenderness. There is no guarding.  Musculoskeletal:        General: No deformity.     Right lower leg: No edema.     Left lower leg: No edema.  Lymphadenopathy:     Cervical: No cervical adenopathy.  Skin:    General: Skin is warm and dry.  Neurological:     Mental Status: He is alert and oriented to person, place, and time. Mental status is at baseline.  Psychiatric:        Mood and Affect: Mood normal.        Behavior: Behavior normal.     LABORATORY DATA:  I have  reviewed the data as listed    Component Value Date/Time   NA 139 04/14/2023 0932   NA 142 07/27/2022 0854   K 3.9 04/14/2023 0932   CL 103 04/14/2023 0932   CO2 27 04/14/2023 0932   GLUCOSE 94 04/14/2023 0932   BUN 19 04/14/2023 0932   BUN 7 (L) 07/27/2022 0854   CREATININE 1.03 04/14/2023 0932   CREATININE 1.10 04/25/2014 0823   CALCIUM 8.9 04/14/2023 0932   PROT 6.5 04/14/2023 0932   PROT 6.2 07/27/2022 0854   PROT 6.7 04/25/2014 0823   ALBUMIN 3.8 04/14/2023 0932   ALBUMIN 4.2 07/27/2022 0854   ALBUMIN 3.8 04/25/2014 0823   AST 25 04/14/2023 0932   ALT 21 04/14/2023 0932   ALT 26 04/25/2014 0823   ALKPHOS 57 04/14/2023 0932   ALKPHOS 44 (L) 04/25/2014 0823   BILITOT 0.9 04/14/2023 0932   GFRNONAA >60 04/14/2023 0932   GFRNONAA >60 04/25/2014 0823   GFRNONAA >60 02/04/2014 0830   GFRAA >60 12/25/2019  1324   GFRAA >60 04/25/2014 0823   GFRAA >60 02/04/2014 0830   No results found for: "SPEP", "UPEP"  Lab Results  Component Value Date   WBC 7.0 04/14/2023   NEUTROABS 4.9 04/14/2023   HGB 12.9 (L) 04/14/2023   HCT 40.8 04/14/2023   MCV 98.8 04/14/2023   PLT 224 04/14/2023    ASSESSMENT & PLAN:   Essential thrombocytosis (HCC) #1 Essential thrombocythemia,  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 FEB 2023-bone marrow biopsy shows 80% hypercellularity; with fibrosis grade 2; no evidence of any blasts or concerns for progression obvious myelofibrosis.  DISCONTINUED anagrelide sec to concerns of myelofibrosis.  # Patient currently on pegylated interferon [[facility]; dr.Reeves; March 2024]-on currently every 2 week basis [since JULY 2024].  Platelet counts consistently in the 1 60-170 range.  Hemoglobin 12 white count normal. Continue aspirin 81mg  po daily.  Defer to [facility] regarding titration of pegylated interferon/ and repeating bone marrow biopsy.   # Hypocalcemia: 8.5- recommend ca+ vit D OTC. Pending vit D 25- [place] levels.   # History  of gout- left big toe-allopurinol [s/p rheumatology].  Stable.  # Elevated PSA/prostate cancer ~ [Dr.Stoiff]- MRI/[facility]- currently on surveillance-slow rise noted May 2024 PSA 9. S/p  urology next week- [facility]   # DISPOSITION:  # labs- CBC/CMP q 4 weeks x6. # Follow-up in  6 months; MD; labs-CBC CMP/LDH/IURIC acid-Dr.B  Cc:   Dr. Sullivan Lone.      Earna Coder, MD 04/14/2023 10:40 AM

## 2023-04-14 NOTE — Progress Notes (Signed)
Doing well in the interim. No side effects from any medications. Appetite and energy are normal. Denies any pain.

## 2023-04-14 NOTE — Assessment & Plan Note (Addendum)
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 FEB 2023-bone marrow biopsy shows 80% hypercellularity; with fibrosis grade 2; no evidence of any blasts or concerns for progression obvious myelofibrosis.  DISCONTINUED anagrelide sec to concerns of myelofibrosis.  # Patient currently on pegylated interferon [[facility]; dr.Reeves; March 2024]-on currently every 2 week basis [since JULY 2024].  Platelet counts consistently in the 1 60-170 range.  Hemoglobin 12 white count normal. Continue aspirin 81mg  po daily.  Defer to [facility] regarding titration of pegylated interferon/ and repeating bone marrow biopsy.   # Hypocalcemia: 8.5- recommend ca+ vit D OTC. Pending vit D 25- OH levels.   # History of gout- left big toe-allopurinol [s/p rheumatology].  Stable.  # Elevated PSA/prostate cancer ~ [Dr.Stoiff]- MRI/[facility]- currently on surveillance-slow rise noted May 2024 PSA 9. S/p  urology next week- [facility]   # DISPOSITION:  # labs- CBC/CMP q 4 weeks x6. # Follow-up in  6 months; MD; labs-CBC CMP/LDH/IURIC acid-Dr.B  Cc:   Dr. Sullivan Lone.

## 2023-05-15 ENCOUNTER — Inpatient Hospital Stay: Payer: Managed Care, Other (non HMO) | Attending: Internal Medicine

## 2023-05-15 ENCOUNTER — Inpatient Hospital Stay: Payer: Managed Care, Other (non HMO)

## 2023-05-15 DIAGNOSIS — D473 Essential (hemorrhagic) thrombocythemia: Secondary | ICD-10-CM | POA: Diagnosis present

## 2023-05-15 LAB — CBC WITH DIFFERENTIAL (CANCER CENTER ONLY)
Abs Immature Granulocytes: 0.04 10*3/uL (ref 0.00–0.07)
Basophils Absolute: 0 10*3/uL (ref 0.0–0.1)
Basophils Relative: 0 %
Eosinophils Absolute: 0 10*3/uL (ref 0.0–0.5)
Eosinophils Relative: 1 %
HCT: 38.5 % — ABNORMAL LOW (ref 39.0–52.0)
Hemoglobin: 12.3 g/dL — ABNORMAL LOW (ref 13.0–17.0)
Immature Granulocytes: 1 %
Lymphocytes Relative: 21 %
Lymphs Abs: 1.2 10*3/uL (ref 0.7–4.0)
MCH: 31.9 pg (ref 26.0–34.0)
MCHC: 31.9 g/dL (ref 30.0–36.0)
MCV: 99.7 fL (ref 80.0–100.0)
Monocytes Absolute: 0.6 10*3/uL (ref 0.1–1.0)
Monocytes Relative: 12 %
Neutro Abs: 3.6 10*3/uL (ref 1.7–7.7)
Neutrophils Relative %: 65 %
Platelet Count: 167 10*3/uL (ref 150–400)
RBC: 3.86 MIL/uL — ABNORMAL LOW (ref 4.22–5.81)
RDW: 14.4 % (ref 11.5–15.5)
WBC Count: 5.5 10*3/uL (ref 4.0–10.5)
nRBC: 0 % (ref 0.0–0.2)

## 2023-05-15 LAB — CMP (CANCER CENTER ONLY)
ALT: 23 U/L (ref 0–44)
AST: 28 U/L (ref 15–41)
Albumin: 3.7 g/dL (ref 3.5–5.0)
Alkaline Phosphatase: 57 U/L (ref 38–126)
Anion gap: 6 (ref 5–15)
BUN: 19 mg/dL (ref 8–23)
CO2: 27 mmol/L (ref 22–32)
Calcium: 8.5 mg/dL — ABNORMAL LOW (ref 8.9–10.3)
Chloride: 104 mmol/L (ref 98–111)
Creatinine: 0.98 mg/dL (ref 0.61–1.24)
GFR, Estimated: >60 mL/min (ref >60)
Glucose, Bld: 96 mg/dL (ref 70–99)
Potassium: 4.6 mmol/L (ref 3.5–5.1)
Sodium: 137 mmol/L (ref 135–145)
Total Bilirubin: 0.6 mg/dL (ref 0.3–1.2)
Total Protein: 6.3 g/dL — ABNORMAL LOW (ref 6.5–8.1)

## 2023-06-14 ENCOUNTER — Inpatient Hospital Stay: Payer: Managed Care, Other (non HMO) | Attending: Internal Medicine | Admitting: Internal Medicine

## 2023-06-14 ENCOUNTER — Other Ambulatory Visit: Payer: Self-pay

## 2023-06-14 DIAGNOSIS — D473 Essential (hemorrhagic) thrombocythemia: Secondary | ICD-10-CM

## 2023-06-14 LAB — CBC WITH DIFFERENTIAL (CANCER CENTER ONLY)
Abs Immature Granulocytes: 0.07 10*3/uL (ref 0.00–0.07)
Basophils Absolute: 0 10*3/uL (ref 0.0–0.1)
Basophils Relative: 0 %
Eosinophils Absolute: 0 10*3/uL (ref 0.0–0.5)
Eosinophils Relative: 0 %
HCT: 42.1 % (ref 39.0–52.0)
Hemoglobin: 13.2 g/dL (ref 13.0–17.0)
Immature Granulocytes: 1 %
Lymphocytes Relative: 20 %
Lymphs Abs: 1.3 10*3/uL (ref 0.7–4.0)
MCH: 31.6 pg (ref 26.0–34.0)
MCHC: 31.4 g/dL (ref 30.0–36.0)
MCV: 100.7 fL — ABNORMAL HIGH (ref 80.0–100.0)
Monocytes Absolute: 1 10*3/uL (ref 0.1–1.0)
Monocytes Relative: 16 %
Neutro Abs: 4.2 10*3/uL (ref 1.7–7.7)
Neutrophils Relative %: 63 %
Platelet Count: 192 10*3/uL (ref 150–400)
RBC: 4.18 MIL/uL — ABNORMAL LOW (ref 4.22–5.81)
RDW: 14.5 % (ref 11.5–15.5)
WBC Count: 6.6 10*3/uL (ref 4.0–10.5)
nRBC: 0 % (ref 0.0–0.2)

## 2023-06-14 LAB — CMP (CANCER CENTER ONLY)
ALT: 29 U/L (ref 0–44)
AST: 33 U/L (ref 15–41)
Albumin: 3.9 g/dL (ref 3.5–5.0)
Alkaline Phosphatase: 65 U/L (ref 38–126)
Anion gap: 7 (ref 5–15)
BUN: 19 mg/dL (ref 8–23)
CO2: 28 mmol/L (ref 22–32)
Calcium: 8.9 mg/dL (ref 8.9–10.3)
Chloride: 105 mmol/L (ref 98–111)
Creatinine: 1.04 mg/dL (ref 0.61–1.24)
GFR, Estimated: >60 mL/min (ref >60)
Glucose, Bld: 80 mg/dL (ref 70–99)
Potassium: 5.5 mmol/L — ABNORMAL HIGH (ref 3.5–5.1)
Sodium: 140 mmol/L (ref 135–145)
Total Bilirubin: 0.7 mg/dL (ref <1.2)
Total Protein: 6.8 g/dL (ref 6.5–8.1)

## 2023-06-14 NOTE — Progress Notes (Signed)
Please order BMP in 1 week-please schedule lab appt. GB ------------------------ Hi Eladio-your potassium is slightly elevated-not sure why.  Have you been taking any new medications?  I would recommend repeating the blood work in a week to make sure that this is not a false reading.  Happy thanksgiving! GB

## 2023-06-14 NOTE — Progress Notes (Signed)
Lab ordered.

## 2023-06-19 ENCOUNTER — Encounter: Payer: Self-pay | Admitting: Internal Medicine

## 2023-06-21 ENCOUNTER — Inpatient Hospital Stay: Payer: Managed Care, Other (non HMO) | Attending: Internal Medicine

## 2023-06-21 DIAGNOSIS — D473 Essential (hemorrhagic) thrombocythemia: Secondary | ICD-10-CM | POA: Insufficient documentation

## 2023-06-21 LAB — BASIC METABOLIC PANEL
Anion gap: 9 (ref 5–15)
BUN: 20 mg/dL (ref 8–23)
CO2: 28 mmol/L (ref 22–32)
Calcium: 9.3 mg/dL (ref 8.9–10.3)
Chloride: 104 mmol/L (ref 98–111)
Creatinine, Ser: 0.9 mg/dL (ref 0.61–1.24)
GFR, Estimated: >60 mL/min (ref >60)
Glucose, Bld: 108 mg/dL — ABNORMAL HIGH (ref 70–99)
Potassium: 4.9 mmol/L (ref 3.5–5.1)
Sodium: 141 mmol/L (ref 135–145)

## 2023-07-03 ENCOUNTER — Ambulatory Visit (INDEPENDENT_AMBULATORY_CARE_PROVIDER_SITE_OTHER): Payer: Managed Care, Other (non HMO) | Admitting: Family Medicine

## 2023-07-03 VITALS — BP 121/63 | HR 67 | Wt 177.0 lb

## 2023-07-03 DIAGNOSIS — J069 Acute upper respiratory infection, unspecified: Secondary | ICD-10-CM | POA: Insufficient documentation

## 2023-07-03 DIAGNOSIS — R059 Cough, unspecified: Secondary | ICD-10-CM | POA: Diagnosis not present

## 2023-07-03 DIAGNOSIS — R0989 Other specified symptoms and signs involving the circulatory and respiratory systems: Secondary | ICD-10-CM

## 2023-07-03 MED ORDER — BENZONATATE 100 MG PO CAPS: 100.0000 mg | ORAL_CAPSULE | Freq: Two times a day (BID) | ORAL | 0 refills | Status: DC | PRN

## 2023-07-03 MED ORDER — GUAIFENESIN-DM 100-10 MG/5ML PO SYRP: 5.0000 mL | ORAL_SOLUTION | ORAL | 0 refills | Status: DC | PRN

## 2023-07-03 MED ORDER — FLUTICASONE PROPIONATE 50 MCG/ACT NA SUSP: 2.0000 | Freq: Every day | NASAL | 0 refills | Status: DC

## 2023-07-03 NOTE — Assessment & Plan Note (Signed)
Pt presents wit 6 days of congestion and cough. Was having headaches, but those had subsided. He feels general symptoms are getting better, but felt cough and congestion still lingering. Cough appears to be post viral in nature exacerbated by post nasal drip form congestion. Does not seem to be a sinus infection or bacterial related - hold on antibiotics for now. Symptomatic mgmt.  - Benzonatate 100 mg tablet as needed BID  for cough suppressant - Guaifenesin-dextromethorphan for sputum expectorant and antitussive. - Flonase intranasal spray - to help alleviate congestion - May use intranasal saline spray as well for congestion - Continue hot tea, throat lozenges, humidification, increase hydration to help loosen secretions.

## 2023-07-03 NOTE — Progress Notes (Signed)
Acute Office Visit  Introduced to nurse practitioner role and practice setting.  All questions answered.  Discussed provider/patient relationship and expectations.   Subjective:     Patient ID: Anthony Graves, male    DOB: November 08, 1960, 62 y.o.   MRN: [id number]  Chief Complaint  Patient presents with  . Cough    Pt stated--coughing clear mucus, headache--6 days Tried OTC delsom/advil just temporary relief.   Pt presents with upper respiratory symptoms for 6 days, since 06/28/23 per pt. States has had cough and congestion, intermittent headaches, but today is has felt the best and symptoms are starting to improve. Feels he has post sinus drainage in back of throat, his cough when irritated will last 10-15 minutes. He has treated delsem, advil, cugh drops, hot tea. The advil has helped, no headaches, facial pain or sinus pressure today, but cough and congestion most bothersome to pt.   Denies fevers, chills, rigors, shortness of breath, difficulty breathing, chest tightness, palpitations, nausea, vomiting, diarrhea, no wheezing. No recent travels.   Cough This is a new problem. The current episode started in the past 7 days. The problem has been gradually improving. The problem occurs every few hours. The cough is Non-productive. Associated symptoms include nasal congestion and postnasal drip. Pertinent negatives include no chest pain, chills, ear congestion, ear pain, fever, headaches, heartburn, hemoptysis, myalgias, rhinorrhea, sore throat, shortness of breath, sweats, weight loss or wheezing. Nothing aggravates the symptoms. He has tried OTC cough suppressant for the symptoms. The treatment provided mild relief. There is no history of asthma, bronchiectasis, bronchitis, COPD, emphysema, environmental allergies or pneumonia.   Review of Systems  Constitutional:  Negative for chills, fever and weight loss.  HENT:  Positive for postnasal drip. Negative for ear pain, rhinorrhea and sore throat.    Respiratory:  Positive for cough. Negative for hemoptysis, shortness of breath and wheezing.   Cardiovascular:  Negative for chest pain.  Gastrointestinal:  Negative for heartburn.  Musculoskeletal:  Negative for myalgias.  Neurological:  Negative for headaches.  Endo/Heme/Allergies:  Negative for environmental allergies.  All other systems reviewed and are negative.     Objective:    BP 121/63 (BP Location: Left Arm, Cuff Size: Normal)   Pulse 67   Wt 177 lb (80.3 kg)   SpO2 100%   BMI 25.40 kg/m    Physical Exam Constitutional:      Appearance: Normal appearance. He is normal weight.  HENT:     Head: Normocephalic.     Salivary Glands: Right salivary gland is not diffusely enlarged or tender. Left salivary gland is not diffusely enlarged or tender.     Right Ear: Hearing, ear canal and external ear normal.     Left Ear: Hearing, ear canal and external ear normal.     Ears:     Comments: Attempted to assess TMs bilaterally, but obstructed view due to amount of cerumen.     Nose: Congestion present. No rhinorrhea.     Right Sinus: No maxillary sinus tenderness or frontal sinus tenderness.     Left Sinus: No maxillary sinus tenderness or frontal sinus tenderness.     Mouth/Throat:     Mouth: Mucous membranes are moist.     Pharynx: Posterior oropharyngeal erythema present. No pharyngeal swelling.     Tonsils: No tonsillar exudate or tonsillar abscesses.  Eyes:     Extraocular Movements: Extraocular movements intact.     Conjunctiva/sclera: Conjunctivae normal.     Pupils: Pupils are equal,  round, and reactive to light.  Neck:     Vascular: No carotid bruit.  Cardiovascular:     Rate and Rhythm: Normal rate and regular rhythm.     Pulses: Normal pulses.  Pulmonary:     Effort: Pulmonary effort is normal.     Breath sounds: Normal breath sounds. No wheezing, rhonchi or rales.  Chest:     Chest wall: No tenderness.  Musculoskeletal:     Cervical back: Normal range of  motion. No rigidity or tenderness.  Lymphadenopathy:     Cervical: No cervical adenopathy.  Skin:    General: Skin is warm and dry.     Capillary Refill: Capillary refill takes less than 2 seconds.  Neurological:     General: No focal deficit present.     Mental Status: He is alert and oriented to person, place, and time. Mental status is at baseline.  Psychiatric:        Mood and Affect: Mood normal.        Behavior: Behavior normal.        Thought Content: Thought content normal.        Judgment: Judgment normal.   No results found for any visits on 07/03/23.      Assessment & Plan:   Problem List Items Addressed This Visit       Respiratory   Upper respiratory infection with cough and congestion - Primary   Pt presents wit 6 days of congestion and cough. Was having headaches, but those had subsided. He feels general symptoms are getting better, but felt cough and congestion still lingering. Cough appears to be post viral in nature exacerbated by post nasal drip form congestion. Does not seem to be a sinus infection or bacterial related - hold on antibiotics for now. Symptomatic mgmt.  - Benzonatate 100 mg tablet as needed BID  for cough suppressant - Guaifenesin-dextromethorphan for sputum expectorant and antitussive. - Flonase intranasal spray - to help alleviate congestion - May use intranasal saline spray as well for congestion - Continue hot tea, throat lozenges, humidification, increase hydration to help loosen secretions.        Relevant Medications   fluticasone (FLONASE) 50 MCG/ACT nasal spray   benzonatate (TESSALON) 100 MG capsule   guaiFENesin-dextromethorphan (ROBITUSSIN DM) 100-10 MG/5ML syrup     Meds ordered this encounter  Medications  . fluticasone (FLONASE) 50 MCG/ACT nasal spray    Sig: Place 2 sprays into both nostrils daily.    Dispense:  16 g    Refill:  0  . benzonatate (TESSALON) 100 MG capsule    Sig: Take 1 capsule (100 mg total) by mouth  2 (two) times daily as needed for cough.    Dispense:  20 capsule    Refill:  0  . guaiFENesin-dextromethorphan (ROBITUSSIN DM) 100-10 MG/5ML syrup    Sig: Take 5 mLs by mouth every 4 (four) hours as needed for cough.    Dispense:  118 mL    Refill:  0    Return if symptoms worsen or fail to improve, for annual physical.  I, Sallee Provencal, FNP, have reviewed all documentation for this visit. The documentation on 07/03/23 for the exam, diagnosis, procedures, and orders are all accurate and complete.   Sallee Provencal, FNP

## 2023-07-24 ENCOUNTER — Inpatient Hospital Stay: Payer: 59 | Attending: Internal Medicine

## 2023-07-24 DIAGNOSIS — D473 Essential (hemorrhagic) thrombocythemia: Secondary | ICD-10-CM | POA: Diagnosis present

## 2023-07-24 LAB — CBC WITH DIFFERENTIAL (CANCER CENTER ONLY)
Abs Immature Granulocytes: 0.08 10*3/uL — ABNORMAL HIGH (ref 0.00–0.07)
Basophils Absolute: 0 10*3/uL (ref 0.0–0.1)
Basophils Relative: 0 %
Eosinophils Absolute: 0 10*3/uL (ref 0.0–0.5)
Eosinophils Relative: 1 %
HCT: 41.7 % (ref 39.0–52.0)
Hemoglobin: 13.3 g/dL (ref 13.0–17.0)
Immature Granulocytes: 1 %
Lymphocytes Relative: 27 %
Lymphs Abs: 1.7 10*3/uL (ref 0.7–4.0)
MCH: 31.1 pg (ref 26.0–34.0)
MCHC: 31.9 g/dL (ref 30.0–36.0)
MCV: 97.4 fL (ref 80.0–100.0)
Monocytes Absolute: 0.7 10*3/uL (ref 0.1–1.0)
Monocytes Relative: 11 %
Neutro Abs: 4 10*3/uL (ref 1.7–7.7)
Neutrophils Relative %: 60 %
Platelet Count: 160 10*3/uL (ref 150–400)
RBC: 4.28 MIL/uL (ref 4.22–5.81)
RDW: 14.6 % (ref 11.5–15.5)
WBC Count: 6.5 10*3/uL (ref 4.0–10.5)
nRBC: 0 % (ref 0.0–0.2)

## 2023-07-24 LAB — CMP (CANCER CENTER ONLY)
ALT: 27 U/L (ref 0–44)
AST: 29 U/L (ref 15–41)
Albumin: 3.9 g/dL (ref 3.5–5.0)
Alkaline Phosphatase: 63 U/L (ref 38–126)
Anion gap: 7 (ref 5–15)
BUN: 20 mg/dL (ref 8–23)
CO2: 28 mmol/L (ref 22–32)
Calcium: 9.3 mg/dL (ref 8.9–10.3)
Chloride: 104 mmol/L (ref 98–111)
Creatinine: 0.87 mg/dL (ref 0.61–1.24)
GFR, Estimated: >60 mL/min (ref >60)
Glucose, Bld: 84 mg/dL (ref 70–99)
Potassium: 5.5 mmol/L — ABNORMAL HIGH (ref 3.5–5.1)
Sodium: 139 mmol/L (ref 135–145)
Total Bilirubin: 0.9 mg/dL (ref 0.0–1.2)
Total Protein: 6.8 g/dL (ref 6.5–8.1)

## 2023-08-09 ENCOUNTER — Ambulatory Visit: Payer: 59 | Admitting: Dermatology

## 2023-08-24 ENCOUNTER — Inpatient Hospital Stay: Payer: 59 | Attending: Internal Medicine

## 2023-08-24 DIAGNOSIS — D473 Essential (hemorrhagic) thrombocythemia: Secondary | ICD-10-CM | POA: Diagnosis present

## 2023-08-24 LAB — CMP (CANCER CENTER ONLY)
ALT: 36 U/L (ref 0–44)
AST: 38 U/L (ref 15–41)
Albumin: 4.2 g/dL (ref 3.5–5.0)
Alkaline Phosphatase: 62 U/L (ref 38–126)
Anion gap: 8 (ref 5–15)
BUN: 21 mg/dL (ref 8–23)
CO2: 28 mmol/L (ref 22–32)
Calcium: 9.3 mg/dL (ref 8.9–10.3)
Chloride: 101 mmol/L (ref 98–111)
Creatinine: 1.02 mg/dL (ref 0.61–1.24)
GFR, Estimated: >60 mL/min (ref >60)
Glucose, Bld: 94 mg/dL (ref 70–99)
Potassium: 4.9 mmol/L (ref 3.5–5.1)
Sodium: 137 mmol/L (ref 135–145)
Total Bilirubin: 0.8 mg/dL (ref 0.0–1.2)
Total Protein: 7 g/dL (ref 6.5–8.1)

## 2023-08-24 LAB — CBC WITH DIFFERENTIAL (CANCER CENTER ONLY)
Abs Immature Granulocytes: 0.06 10*3/uL (ref 0.00–0.07)
Basophils Absolute: 0 10*3/uL (ref 0.0–0.1)
Basophils Relative: 0 %
Eosinophils Absolute: 0 10*3/uL (ref 0.0–0.5)
Eosinophils Relative: 0 %
HCT: 42.2 % (ref 39.0–52.0)
Hemoglobin: 13.6 g/dL (ref 13.0–17.0)
Immature Granulocytes: 1 %
Lymphocytes Relative: 22 %
Lymphs Abs: 1.5 10*3/uL (ref 0.7–4.0)
MCH: 31.3 pg (ref 26.0–34.0)
MCHC: 32.2 g/dL (ref 30.0–36.0)
MCV: 97 fL (ref 80.0–100.0)
Monocytes Absolute: 1.2 10*3/uL — ABNORMAL HIGH (ref 0.1–1.0)
Monocytes Relative: 18 %
Neutro Abs: 4.2 10*3/uL (ref 1.7–7.7)
Neutrophils Relative %: 59 %
Platelet Count: 189 10*3/uL (ref 150–400)
RBC: 4.35 MIL/uL (ref 4.22–5.81)
RDW: 14.7 % (ref 11.5–15.5)
WBC Count: 7 10*3/uL (ref 4.0–10.5)
nRBC: 0 % (ref 0.0–0.2)

## 2023-09-21 ENCOUNTER — Inpatient Hospital Stay: Payer: Self-pay | Attending: Internal Medicine

## 2023-09-21 DIAGNOSIS — Z7982 Long term (current) use of aspirin: Secondary | ICD-10-CM | POA: Diagnosis not present

## 2023-09-21 DIAGNOSIS — D473 Essential (hemorrhagic) thrombocythemia: Secondary | ICD-10-CM | POA: Diagnosis present

## 2023-09-21 DIAGNOSIS — Z79899 Other long term (current) drug therapy: Secondary | ICD-10-CM | POA: Diagnosis not present

## 2023-09-21 LAB — CBC WITH DIFFERENTIAL (CANCER CENTER ONLY)
Abs Immature Granulocytes: 0.06 10*3/uL (ref 0.00–0.07)
Basophils Absolute: 0 10*3/uL (ref 0.0–0.1)
Basophils Relative: 0 %
Eosinophils Absolute: 0 10*3/uL (ref 0.0–0.5)
Eosinophils Relative: 1 %
HCT: 41.8 % (ref 39.0–52.0)
Hemoglobin: 13.4 g/dL (ref 13.0–17.0)
Immature Granulocytes: 1 %
Lymphocytes Relative: 22 %
Lymphs Abs: 1.4 10*3/uL (ref 0.7–4.0)
MCH: 31.3 pg (ref 26.0–34.0)
MCHC: 32.1 g/dL (ref 30.0–36.0)
MCV: 97.7 fL (ref 80.0–100.0)
Monocytes Absolute: 0.8 10*3/uL (ref 0.1–1.0)
Monocytes Relative: 13 %
Neutro Abs: 3.9 10*3/uL (ref 1.7–7.7)
Neutrophils Relative %: 63 %
Platelet Count: 210 10*3/uL (ref 150–400)
RBC: 4.28 MIL/uL (ref 4.22–5.81)
RDW: 14.6 % (ref 11.5–15.5)
WBC Count: 6.1 10*3/uL (ref 4.0–10.5)
nRBC: 0 % (ref 0.0–0.2)

## 2023-09-21 LAB — CMP (CANCER CENTER ONLY)
ALT: 32 U/L (ref 0–44)
AST: 34 U/L (ref 15–41)
Albumin: 4 g/dL (ref 3.5–5.0)
Alkaline Phosphatase: 61 U/L (ref 38–126)
Anion gap: 10 (ref 5–15)
BUN: 21 mg/dL (ref 8–23)
CO2: 26 mmol/L (ref 22–32)
Calcium: 9.2 mg/dL (ref 8.9–10.3)
Chloride: 101 mmol/L (ref 98–111)
Creatinine: 1.01 mg/dL (ref 0.61–1.24)
GFR, Estimated: >60 mL/min (ref >60)
Glucose, Bld: 89 mg/dL (ref 70–99)
Potassium: 4.5 mmol/L (ref 3.5–5.1)
Sodium: 137 mmol/L (ref 135–145)
Total Bilirubin: 0.7 mg/dL (ref 0.0–1.2)
Total Protein: 7.2 g/dL (ref 6.5–8.1)

## 2023-09-22 ENCOUNTER — Inpatient Hospital Stay: Payer: Self-pay

## 2023-09-28 ENCOUNTER — Encounter: Payer: Self-pay | Admitting: Dermatology

## 2023-09-28 ENCOUNTER — Ambulatory Visit: Payer: 59 | Admitting: Dermatology

## 2023-09-28 DIAGNOSIS — D492 Neoplasm of unspecified behavior of bone, soft tissue, and skin: Secondary | ICD-10-CM | POA: Diagnosis not present

## 2023-09-28 DIAGNOSIS — L821 Other seborrheic keratosis: Secondary | ICD-10-CM

## 2023-09-28 DIAGNOSIS — W908XXA Exposure to other nonionizing radiation, initial encounter: Secondary | ICD-10-CM

## 2023-09-28 DIAGNOSIS — Z86018 Personal history of other benign neoplasm: Secondary | ICD-10-CM

## 2023-09-28 DIAGNOSIS — D489 Neoplasm of uncertain behavior, unspecified: Secondary | ICD-10-CM

## 2023-09-28 DIAGNOSIS — Z1283 Encounter for screening for malignant neoplasm of skin: Secondary | ICD-10-CM | POA: Diagnosis not present

## 2023-09-28 DIAGNOSIS — L578 Other skin changes due to chronic exposure to nonionizing radiation: Secondary | ICD-10-CM | POA: Diagnosis not present

## 2023-09-28 DIAGNOSIS — L820 Inflamed seborrheic keratosis: Secondary | ICD-10-CM

## 2023-09-28 DIAGNOSIS — Z7189 Other specified counseling: Secondary | ICD-10-CM

## 2023-09-28 DIAGNOSIS — Z872 Personal history of diseases of the skin and subcutaneous tissue: Secondary | ICD-10-CM

## 2023-09-28 DIAGNOSIS — L814 Other melanin hyperpigmentation: Secondary | ICD-10-CM

## 2023-09-28 DIAGNOSIS — D229 Melanocytic nevi, unspecified: Secondary | ICD-10-CM

## 2023-09-28 DIAGNOSIS — L739 Follicular disorder, unspecified: Secondary | ICD-10-CM

## 2023-09-28 DIAGNOSIS — Z85828 Personal history of other malignant neoplasm of skin: Secondary | ICD-10-CM

## 2023-09-28 DIAGNOSIS — D1801 Hemangioma of skin and subcutaneous tissue: Secondary | ICD-10-CM

## 2023-09-28 NOTE — Patient Instructions (Signed)
 Cryotherapy Aftercare  Wash gently with soap and water everyday.   Apply Vaseline and Band-Aid daily until healed.    Seborrheic Keratosis  What causes seborrheic keratoses? Seborrheic keratoses are harmless, common skin growths that first appear during adult life.  As time goes by, more growths appear.  Some people may develop a large number of them.  Seborrheic keratoses appear on both covered and uncovered body parts.  They are not caused by sunlight.  The tendency to develop seborrheic keratoses can be inherited.  They vary in color from skin-colored to gray, brown, or even black.  They can be either smooth or have a rough, warty surface.   Seborrheic keratoses are superficial and look as if they were stuck on the skin.  Under the microscope this type of keratosis looks like layers upon layers of skin.  That is why at times the top layer may seem to fall off, but the rest of the growth remains and re-grows.    Treatment Seborrheic keratoses do not need to be treated, but can easily be removed in the office.  Seborrheic keratoses often cause symptoms when they rub on clothing or jewelry.  Lesions can be in the way of shaving.  If they become inflamed, they can cause itching, soreness, or burning.  Removal of a seborrheic keratosis can be accomplished by freezing, burning, or surgery. If any spot bleeds, scabs, or grows rapidly, please return to have it checked, as these can be an indication of a skin cancer.   Melanoma ABCDEs  Melanoma is the most dangerous type of skin cancer, and is the leading cause of death from skin disease.  You are more likely to develop melanoma if you: Have light-colored skin, light-colored eyes, or red or blond hair Spend a lot of time in the sun Tan regularly, either outdoors or in a tanning bed Have had blistering sunburns, especially during childhood Have a close family member who has had a melanoma Have atypical moles or large birthmarks  Early detection  of melanoma is key since treatment is typically straightforward and cure rates are extremely high if we catch it early.   The first sign of melanoma is often a change in a mole or a new dark spot.  The ABCDE system is a way of remembering the signs of melanoma.  A for asymmetry:  The two halves do not match. B for border:  The edges of the growth are irregular. C for color:  A mixture of colors are present instead of an even brown color. D for diameter:  Melanomas are usually (but not always) greater than 6mm - the size of a pencil eraser. E for evolution:  The spot keeps changing in size, shape, and color.  Please check your skin once per month between visits. You can use a small mirror in front and a large mirror behind you to keep an eye on the back side or your body.   If you see any new or changing lesions before your next follow-up, please call to schedule a visit.  Please continue daily skin protection including broad spectrum sunscreen SPF 30+ to sun-exposed areas, reapplying every 2 hours as needed when you're outdoors.   Staying in the shade or wearing long sleeves, sun glasses (UVA+UVB protection) and wide brim hats (4-inch brim around the entire circumference of the hat) are also recommended for sun protection.    Due to recent changes in healthcare laws, you may see results of your pathology and/or  laboratory studies on MyChart before the doctors have had a chance to review them. We understand that in some cases there may be results that are confusing or concerning to you. Please understand that not all results are received at the same time and often the doctors may need to interpret multiple results in order to provide you with the best plan of care or course of treatment. Therefore, we ask that you please give [place] 2 business days to thoroughly review all your results before contacting the office for clarification. Should we see a critical lab result, you will be contacted  sooner.   If You Need Anything After Your Visit  If you have any questions or concerns for your doctor, please call our main line at [phone] and press option 4 to reach your doctor's medical assistant. If no one answers, please leave a voicemail as directed and we will return your call as soon as possible. Messages left after 4 pm will be answered the following business day.   You may also send [place] a message via MyChart. We typically respond to MyChart messages within 1-2 business days.  For prescription refills, please ask your pharmacy to contact our office. Our fax number is [phone].  If you have an urgent issue when the clinic is closed that cannot wait until the next business day, you can page your doctor at the number below.    Please note that while we do our best to be available for urgent issues outside of office hours, we are not available 24/7.   If you have an urgent issue and are unable to reach [place], you may choose to seek medical care at your doctor's office, retail clinic, urgent care center, or emergency room.  If you have a medical emergency, please immediately call 911 or go to the emergency department.  Pager Numbers  - Dr. Gwen Pounds: [phone]  - Dr. Roseanne Reno: [phone]  - Dr. Katrinka Blazing: [phone]   In the event of inclement weather, please call our main line at [phone] for an update on the status of any delays or closures.  Dermatology Medication Tips: Please keep the boxes that topical medications come in in order to help keep track of the instructions about where and how to use these. Pharmacies typically print the medication instructions only on the boxes and not directly on the medication tubes.   If your medication is too expensive, please contact our office at [phone] option 4 or send [place] a message through MyChart.   We are unable to tell what your co-pay for medications will be in advance as this is different depending on your  insurance coverage. However, we may be able to find a substitute medication at lower cost or fill out paperwork to get insurance to cover a needed medication.   If a prior authorization is required to get your medication covered by your insurance company, please allow [place] 1-2 business days to complete this process.  Drug prices often vary depending on where the prescription is filled and some pharmacies may offer cheaper prices.  The website www.goodrx.com contains coupons for medications through different pharmacies. The prices here do not account for what the cost may be with help from insurance (it may be cheaper with your insurance), but the website can give you the price if you did not use any insurance.  - You can print the associated coupon and take it with your prescription to the pharmacy.  - You may also stop by our  office during regular business hours and pick up a GoodRx coupon card.  - If you need your prescription sent electronically to a different pharmacy, notify our office through [facility] or by phone at [phone] option 4.     Si Usted Necesita Algo Despus de Su Visita  Tambin puede enviarnos un mensaje a travs de Clinical cytogeneticist. Por lo general respondemos a los mensajes de MyChart en el transcurso de 1 a 2 das hbiles.  Para renovar recetas, por favor pida a su farmacia que se ponga en contacto con nuestra oficina. Annie Sable de fax es [place] [phone].  Si tiene un asunto urgente cuando la clnica est cerrada y que no puede esperar hasta el siguiente da hbil, puede llamar/localizar a su doctor(a) al nmero que aparece a continuacin.   Por favor, tenga en cuenta que aunque hacemos todo lo posible para estar disponibles para asuntos urgentes fuera del horario de [place], no estamos disponibles las 24 horas del da, los 7 [address]  [address] de la [place].   Si tiene un problema urgente y no puede comunicarse con nosotros, puede optar por buscar atencin mdica  en el  consultorio de su doctor(a), en una clnica privada, en un centro de atencin urgente o en una sala de emergencias.  Si tiene Engineer, drilling, por favor llame inmediatamente al 911 o vaya a la sala de emergencias.  Nmeros de bper  - Dr. Gwen Pounds: [phone]  - Dra. Roseanne Reno: [phone]  - Dr. Katrinka Blazing: [phone]   En caso de inclemencias del tiempo, por favor llame a Lacy Duverney principal al [phone] para una actualizacin sobre el [place] de cualquier retraso o cierre.  Consejos para la medicacin en dermatologa: Por favor, guarde las cajas en las que vienen los medicamentos de uso tpico para ayudarle a seguir las instrucciones sobre dnde y cmo usarlos. Las farmacias generalmente imprimen las instrucciones del medicamento slo en las cajas y no directamente en los tubos del [place].   Si su medicamento es muy caro, por favor, pngase en contacto con Rolm Gala llamando al [phone] y presione la opcin 4 o envenos un mensaje a travs de Clinical cytogeneticist.   No podemos decirle cul ser su copago por los medicamentos por adelantado ya que esto es diferente dependiendo de la cobertura de su seguro. Sin embargo, es posible que podamos encontrar un medicamento sustituto a Audiological scientist un formulario para que el seguro cubra el medicamento que se considera necesario.   Si se requiere una autorizacin previa para que su compaa de seguros [place] su medicamento, por favor permtanos de 1 a 2 das hbiles para completar [address]  Los precios de los medicamentos varan con frecuencia dependiendo del Environmental consultant de dnde se surte la receta y alguna farmacias pueden ofrecer precios ms baratos.  El sitio web www.goodrx.com tiene cupones para medicamentos de Health and safety inspector. Los precios aqu no tienen en cuenta lo que podra costar con la ayuda del seguro (puede ser ms barato con su seguro), pero el sitio web puede darle el precio si no utiliz Tourist information centre manager.  -  Puede imprimir el cupn correspondiente y llevarlo con su receta a la farmacia.  - Tambin puede pasar por nuestra oficina durante el horario de atencin regular y Education officer, museum una tarjeta de cupones de GoodRx.  - Si necesita que su receta se enve electrnicamente a una farmacia diferente, informe a nuestra oficina a travs de MyChart de [place] o por telfono llamando al [phone] y presione la opcin 4.

## 2023-09-28 NOTE — Progress Notes (Signed)
 Follow-Up Visit   Subjective  Anthony Graves is a 63 y.o. male who presents for the following: Skin Cancer Screening and Full Body Skin Exam Hx of isks, hx of bcc Spot at scalp , spot in front of left ear   The patient presents for Total-Body Skin Exam (TBSE) for skin cancer screening and mole check. The patient has spots, moles and lesions to be evaluated, some may be new or changing and the patient may have concern these could be cancer.  The following portions of the chart were reviewed this encounter and updated as appropriate: medications, allergies, medical history  Review of Systems:  No other skin or systemic complaints except as noted in HPI or Assessment and Plan.  Objective  Well appearing patient in no apparent distress; mood and affect are within normal limits.  A full examination was performed including scalp, head, eyes, ears, nose, lips, neck, chest, axillae, abdomen, back, buttocks, bilateral upper extremities, bilateral lower extremities, hands, feet, fingers, toes, fingernails, and toenails. All findings within normal limits unless otherwise noted below.   Relevant physical exam findings are noted in the Assessment and Plan.  back x 1, scalp, face, and neck > 15,  right upper lateral eyelid margin x 1, right lower medial eyelid margin x 1, (18) Erythematous stuck-on, waxy papule or plaque right lateral infrapectoral 1.1 cm keratotic papule    Assessment & Plan   SKIN CANCER SCREENING PERFORMED TODAY.  ACTINIC DAMAGE - Chronic condition, secondary to cumulative UV/sun exposure - diffuse scaly erythematous macules with underlying dyspigmentation - Recommend daily broad spectrum sunscreen SPF 30+ to sun-exposed areas, reapply every 2 hours as needed.  - Staying in the shade or wearing long sleeves, sun glasses (UVA+UVB protection) and wide brim hats (4-inch brim around the entire circumference of the hat) are also recommended for sun protection.  - Call for new or  changing lesions.  LENTIGINES, SEBORRHEIC KERATOSES, HEMANGIOMAS - Benign normal skin lesions - Benign-appearing - Call for any changes  MELANOCYTIC NEVI - Tan-brown and/or pink-flesh-colored symmetric macules and papules - Benign appearing on exam today - Observation - Call clinic for new or changing moles - Recommend daily use of broad spectrum spf 30+ sunscreen to sun-exposed areas.   HISTORY OF BASAL CELL CARCINOMA OF THE SKIN Multiple locations see history  - No evidence of recurrence today - Recommend regular full body skin exams - Recommend daily broad spectrum sunscreen SPF 30+ to sun-exposed areas, reapply every 2 hours as needed.  - Call if any new or changing lesions are noted between office visits  HISTORY OF DYSPLASTIC NEVUS Multiple locations see history  No evidence of recurrence today Recommend regular full body skin exams Recommend daily broad spectrum sunscreen SPF 30+ to sun-exposed areas, reapply every 2 hours as needed.  Call if any new or changing lesions are noted between office visits  FOLLICULITIS (Sterile) - possibly friction related on thighs Exam: Perifollicular erythematous papules and pustules  Folliculitis occurs due to inflammation of the superficial hair follicle (pore), resulting in acne-like lesions (pus bumps). It can be infectious (bacterial, fungal) or noninfectious (shaving, tight clothing, heat/sweat, medications).  Folliculitis can be acute or chronic and recommended treatment depends on the underlying cause of folliculitis. Treatment Plan: Discussed topical clindamycin to affected areas daily as needed  Patient deferred treatment at this time  INFLAMED SEBORRHEIC KERATOSIS (18) back x 1, scalp, face, and neck > 15,  right upper lateral eyelid margin x 1, right lower medial eyelid  margin x 1, (18) Symptomatic, irritating, patient would like treated. Destruction of lesion - back x 1, scalp, face, and neck > 15,  right upper lateral eyelid  margin x 1, right lower medial eyelid margin x 1, (18) Complexity: simple   Destruction method: cryotherapy   Informed consent: discussed and consent obtained   Timeout:  patient name, date of birth, surgical site, and procedure verified Lesion destroyed using liquid nitrogen: Yes   Region frozen until ice ball extended beyond lesion: Yes   Outcome: patient tolerated procedure well with no complications   Post-procedure details: wound care instructions given   NEOPLASM OF UNCERTAIN BEHAVIOR right lateral infrapectoral Epidermal / dermal shaving  Lesion diameter (cm):  1.1 Informed consent: discussed and consent obtained   Timeout: patient name, date of birth, surgical site, and procedure verified   Procedure prep:  Patient was prepped and draped in usual sterile fashion Prep type:  Isopropyl alcohol Anesthesia: the lesion was anesthetized in a standard fashion   Anesthetic:  1% lidocaine w/ epinephrine 1-100,000 buffered w/ 8.4% NaHCO3 Instrument used: flexible razor blade   Hemostasis achieved with: pressure, aluminum chloride and electrodesiccation   Outcome: patient tolerated procedure well   Post-procedure details: sterile dressing applied and wound care instructions given   Dressing type: bandage and petrolatum   Specimen 1 - Surgical pathology Differential Diagnosis: Isk r/o dysplasia   Check Margins: No Isk r/o dysplasia  Return in about 1 year (around 09/27/2024) for TBSE.  IAsher Muir, CMA, am acting as scribe for Armida Sans, MD.   Documentation: I have reviewed the above documentation for accuracy and completeness, and I agree with the above.  Armida Sans, MD

## 2023-10-02 ENCOUNTER — Telehealth: Payer: Self-pay

## 2023-10-02 ENCOUNTER — Encounter: Payer: Self-pay | Admitting: Dermatology

## 2023-10-02 LAB — SURGICAL PATHOLOGY

## 2023-10-02 NOTE — Telephone Encounter (Signed)
-----   Message from Armida Sans sent at 10/02/2023  5:41 PM EDT ----- FINAL DIAGNOSIS        1. Skin, right lateral infrapectoral :       SEBORRHEIC KERATOSIS, INFLAMED   Benign inflamed keratosis As suspected No further treatment needed

## 2023-10-02 NOTE — Telephone Encounter (Signed)
 Patient informed of pathology results

## 2023-10-12 ENCOUNTER — Inpatient Hospital Stay: Payer: Self-pay

## 2023-10-12 ENCOUNTER — Inpatient Hospital Stay (HOSPITAL_BASED_OUTPATIENT_CLINIC_OR_DEPARTMENT_OTHER): Payer: Self-pay | Admitting: Internal Medicine

## 2023-10-12 ENCOUNTER — Encounter: Payer: Self-pay | Admitting: Internal Medicine

## 2023-10-12 VITALS — BP 122/69 | HR 61 | Temp 96.9°F | Resp 16 | Ht 70.0 in | Wt 173.8 lb

## 2023-10-12 DIAGNOSIS — D473 Essential (hemorrhagic) thrombocythemia: Secondary | ICD-10-CM | POA: Diagnosis not present

## 2023-10-12 LAB — CMP (CANCER CENTER ONLY)
ALT: 23 U/L (ref 0–44)
AST: 31 U/L (ref 15–41)
Albumin: 4 g/dL (ref 3.5–5.0)
Alkaline Phosphatase: 61 U/L (ref 38–126)
Anion gap: 11 (ref 5–15)
BUN: 21 mg/dL (ref 8–23)
CO2: 28 mmol/L (ref 22–32)
Calcium: 9.3 mg/dL (ref 8.9–10.3)
Chloride: 99 mmol/L (ref 98–111)
Creatinine: 1 mg/dL (ref 0.61–1.24)
GFR, Estimated: >60 mL/min (ref >60)
Glucose, Bld: 112 mg/dL — ABNORMAL HIGH (ref 70–99)
Potassium: 5 mmol/L (ref 3.5–5.1)
Sodium: 138 mmol/L (ref 135–145)
Total Bilirubin: 0.8 mg/dL (ref 0.0–1.2)
Total Protein: 6.9 g/dL (ref 6.5–8.1)

## 2023-10-12 LAB — CBC WITH DIFFERENTIAL (CANCER CENTER ONLY)
Abs Immature Granulocytes: 0.1 10*3/uL — ABNORMAL HIGH (ref 0.00–0.07)
Basophils Absolute: 0 10*3/uL (ref 0.0–0.1)
Basophils Relative: 0 %
Eosinophils Absolute: 0.1 10*3/uL (ref 0.0–0.5)
Eosinophils Relative: 1 %
HCT: 42.8 % (ref 39.0–52.0)
Hemoglobin: 13.6 g/dL (ref 13.0–17.0)
Immature Granulocytes: 1 %
Lymphocytes Relative: 17 %
Lymphs Abs: 1.3 10*3/uL (ref 0.7–4.0)
MCH: 31.2 pg (ref 26.0–34.0)
MCHC: 31.8 g/dL (ref 30.0–36.0)
MCV: 98.2 fL (ref 80.0–100.0)
Monocytes Absolute: 1 10*3/uL (ref 0.1–1.0)
Monocytes Relative: 13 %
Neutro Abs: 5 10*3/uL (ref 1.7–7.7)
Neutrophils Relative %: 68 %
Platelet Count: 218 10*3/uL (ref 150–400)
RBC: 4.36 MIL/uL (ref 4.22–5.81)
RDW: 14.5 % (ref 11.5–15.5)
WBC Count: 7.4 10*3/uL (ref 4.0–10.5)
nRBC: 0 % (ref 0.0–0.2)

## 2023-10-12 LAB — LACTATE DEHYDROGENASE: LDH: 201 U/L — ABNORMAL HIGH (ref 98–192)

## 2023-10-12 LAB — URIC ACID: Uric Acid, Serum: 5.1 mg/dL (ref 3.7–8.6)

## 2023-10-12 NOTE — Progress Notes (Signed)
 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  Patient continues to be asymptomatic.  LDH slightly elevated.    # MAY 2023-second opinion-[facility] Dr. Anise Salvo; DISCONTINUED anagrelide [secondary to concern for bone marrow fibrosis]; rising LDH; on aspirin only   DIAGNOSIS: ET    Essential thrombocytosis (HCC)   INTERVAL HISTORY: Ambulating independently.  Alone.  Patient 1 very pleasant male who returns to clinic for follow-up of essential thrombocytosis, CALR mutation, with concerns for myelofibrosis-currently on aspirin and peg interfon alfa [Dr.Reeves; [facility]] lone is here for follow-up.  Patient is doing well in the interim. No side effects from any medications. Appetite and energy are normal. Denies any pain..   Patient denies any bleeding. Patient denies any fatigue.  Denies any myalgias.  Denies any shortness of breath or weight loss or night sweats.   Review of Systems  Constitutional:  Negative for chills, diaphoresis, fever, malaise/fatigue and weight loss.  HENT:  Negative for nosebleeds and sore throat.   Eyes:  Negative for double vision.  Respiratory:  Negative for cough, hemoptysis, sputum  production, shortness of breath and wheezing.   Cardiovascular:  Negative for chest pain, palpitations, orthopnea and leg swelling.  Gastrointestinal:  Negative for abdominal pain, blood in stool, constipation, diarrhea, heartburn, melena, nausea and vomiting.  Genitourinary:  Negative for dysuria, frequency and urgency.  Musculoskeletal:  Negative for back pain and joint pain.  Skin: Negative.  Negative for itching and rash.  Neurological:  Negative for dizziness, tingling, focal weakness, weakness and headaches.  Endo/Heme/Allergies:  Does not bruise/bleed easily.  Psychiatric/Behavioral:  Negative for depression. The patient is not nervous/anxious and does not have insomnia.    PAST MEDICAL HISTORY :  Past Medical History:  Diagnosis Date   Arthritis    osteoartiritis   Basal cell carcinoma 10/12/2009   Left post. shoulder sup. lateral scapula, 3cm med. to hemangioma.    Basal cell carcinoma 02/26/2015   Right temple. Nodular.   Basal cell carcinoma 03/21/2019   Right inf. med. pectoral. Nodular. EDC   Basal cell carcinoma 03/21/2019   Left lat. abdomen parallel lat. to umbilicus. Micronodular. EDC.   Dysplastic nevus 01/28/2008   Left upper back medial sup scapula. Moderate to severe atypia, margins involved. Excised 03/04/2008, persistent DN, close to margin.   Dysplastic nevus 07/01/2010   Left low back paraspinal lat. Mild atypiua, margins involved.    Dysplastic nevus 07/01/2010   Left low back paraspinal me. Mild atypia, margins involved.    Gout    High platelet count    Prostate cancer (HCC)    per pt    PAST SURGICAL HISTORY :   Past Surgical History:  Procedure Laterality Date   BONE MARROW BIOPSY     COLONOSCOPY  2016   CYSTECTOMY  years ago   pilonidal cyst removed   KNEE SURGERY Right 20 years ago  arthrocscopy   TOTAL KNEE ARTHROPLASTY Right 08/15/2016   Procedure: RIGHT TOTAL KNEE ARTHROPLASTY;  Surgeon: Ollen Gross, MD;  Location: [facility];  Service:  Orthopedics;  Laterality: Right;   FAMILY HISTORY :  History reviewed. No pertinent family history.  SOCIAL HISTORY:   Social History   Tobacco Use   Smoking status: Never   Smokeless tobacco: Never  Vaping Use   Vaping status: Never Used  Substance Use Topics   Alcohol use: Yes    Alcohol/week: 3.0 - 7.0 standard drinks of alcohol    Types: 3 - 7 Glasses of wine per week   Drug use: No    ALLERGIES:  is allergic to penicillins.  MEDICATIONS:  Current Outpatient Medications  Medication Sig Dispense Refill   allopurinol (ZYLOPRIM) 100 MG tablet Take 300 mg by mouth daily.     aspirin EC 81 MG tablet Take 1 tablet by mouth daily.     peginterferon alfa-2a (PEGASYS) 180 MCG/ML injection Inject 180 mcg into the skin every 14 (fourteen) days.     tamsulosin (FLOMAX) 0.4 MG CAPS capsule TAKE 1 CAPSULE BY MOUTH EVERY DAY 90 capsule 3   No current facility-administered medications for this visit.    PHYSICAL EXAMINATION: ECOG PERFORMANCE STATUS: 0 - Asymptomatic  BP 122/69 (BP Location: Left Arm, Patient Position: Sitting, Cuff Size: Normal)   Pulse 61   Temp (!) 96.9 F (36.1 C) (Tympanic)   Resp 16   Ht 5\' 10"  (1.778 m)   Wt 173 lb 12.8 oz (78.8 kg)   SpO2 100%   BMI 24.94 kg/m   Filed Weights   10/12/23 0941  Weight: 173 lb 12.8 oz (78.8 kg)      Physical Exam Constitutional:      Appearance: He is not ill-appearing.  Eyes:     General: No scleral icterus.    Conjunctiva/sclera: Conjunctivae normal.  Cardiovascular:     Rate and Rhythm: Normal rate and regular rhythm.  Abdominal:     General: There is no distension.     Palpations: Abdomen is soft.     Tenderness: There is no abdominal tenderness. There is no guarding.  Musculoskeletal:        General: No deformity.     Right lower leg: No edema.     Left lower leg: No edema.  Lymphadenopathy:     Cervical: No cervical adenopathy.  Skin:    General: Skin is warm and dry.  Neurological:     Mental  Status: He is alert and oriented to person, place, and time. Mental status is at baseline.  Psychiatric:        Mood and Affect: Mood normal.        Behavior: Behavior normal.     LABORATORY DATA:  I have reviewed the data as listed    Component Value Date/Time   NA 138 10/12/2023 0947   NA 142 07/27/2022 0854   K 5.0 10/12/2023 0947   CL 99 10/12/2023 0947   CO2 28 10/12/2023 0947   GLUCOSE 112 (H) 10/12/2023 0947   BUN 21 10/12/2023 0947   BUN 7 (L) 07/27/2022 0854   CREATININE 1.00 10/12/2023 0947   CREATININE 1.10 04/25/2014 0823   CALCIUM 9.3 10/12/2023 0947   PROT 6.9 10/12/2023 0947   PROT 6.2 07/27/2022 0854   PROT 6.7 04/25/2014 0823   ALBUMIN 4.0 10/12/2023 0947   ALBUMIN 4.2 07/27/2022 0854   ALBUMIN 3.8 04/25/2014 0823   AST 31 10/12/2023 0947  ALT 23 10/12/2023 0947   ALT 26 04/25/2014 0823   ALKPHOS 61 10/12/2023 0947   ALKPHOS 44 (L) 04/25/2014 0823   BILITOT 0.8 10/12/2023 0947   GFRNONAA >60 10/12/2023 0947   GFRNONAA >60 04/25/2014 0823   GFRNONAA >60 02/04/2014 0830   GFRAA >60 12/25/2019 1324   GFRAA >60 04/25/2014 0823   GFRAA >60 02/04/2014 0830   No results found for: "SPEP", "UPEP"  Lab Results  Component Value Date   WBC 7.4 10/12/2023   NEUTROABS 5.0 10/12/2023   HGB 13.6 10/12/2023   HCT 42.8 10/12/2023   MCV 98.2 10/12/2023   PLT 218 10/12/2023    ASSESSMENT & PLAN:   Essential thrombocytosis (HCC) #1 Essential thrombocythemia,  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 FEB 2023-bone marrow biopsy shows 80% hypercellularity; with fibrosis grade 2; no evidence of any blasts or concerns for progression obvious myelofibrosis.  DISCONTINUED anagrelide sec to concerns of myelofibrosis.  # Patient currently on pegylated interferon [[facility]; dr.Reeves; March 2024]-on currently every 2 week basis [since JULY 2024].  Platelet counts consistently in the 1 60-170 range.  Hemoglobin 12 white count normal.  Continue aspirin 81mg  po daily.  Defer to [facility] regarding titration of pegylated interferon/ and repeating bone marrow biopsy. Awaiting re-evaluation with [facility]-   # Hypocalcemia: 9.0 - recommend ca+ vit D OTC- stable.  # History of gout- left big toe-allopurinol [s/p rheumatology].  Stable.  # Elevated PSA/prostate cancer ~ [Dr.Stoiff]- MRI/[facility]- currently on surveillance-slow rise noted May 2024 PSA 9. S/p  urology next week- [facility]- Stable.  # DISPOSITION:  # labs- CBC/CMP q 4 weeks x 6. # Follow-up in  6 months; MD; labs-CBC CMP/LDH/IURIC acid-Dr.B  Cc:   Dr. Sullivan Lone.       Earna Coder, MD 10/12/2023 10:36 AM

## 2023-10-12 NOTE — Progress Notes (Signed)
 Fatigue: NO Headaches:  NO Joint pain: NO Bleeding from gums/nose:  NO

## 2023-10-12 NOTE — Assessment & Plan Note (Addendum)
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 FEB 2023-bone marrow biopsy shows 80% hypercellularity; with fibrosis grade 2; no evidence of any blasts or concerns for progression obvious myelofibrosis.  DISCONTINUED anagrelide sec to concerns of myelofibrosis.  # Patient currently on pegylated interferon [[facility]; dr.Reeves; March 2024]-on currently every 2 week basis [since JULY 2024].  Platelet counts consistently in the 1 60-170 range.  Hemoglobin 12 white count normal. Continue aspirin 81mg  po daily.  Defer to [facility] regarding titration of pegylated interferon/ and repeating bone marrow biopsy. Awaiting re-evaluation with [facility]-   # Hypocalcemia: 9.0 - recommend ca+ vit D OTC- stable.  # History of gout- left big toe-allopurinol [s/p rheumatology].  Stable.  # Elevated PSA/prostate cancer ~ [Dr.Stoiff]- MRI/[facility]- currently on surveillance-slow rise noted May 2024 PSA 9. S/p  urology next week- [facility]- Stable.  # DISPOSITION:  # labs- CBC/CMP q 4 weeks x 6. # Follow-up in  6 months; MD; labs-CBC CMP/LDH/IURIC acid-Dr.B  Cc:   Dr. Sullivan Lone.

## 2023-11-08 ENCOUNTER — Inpatient Hospital Stay: Attending: Internal Medicine

## 2023-11-08 DIAGNOSIS — D473 Essential (hemorrhagic) thrombocythemia: Secondary | ICD-10-CM | POA: Insufficient documentation

## 2023-11-08 LAB — CBC WITH DIFFERENTIAL (CANCER CENTER ONLY)
Abs Immature Granulocytes: 0.06 10*3/uL (ref 0.00–0.07)
Basophils Absolute: 0 10*3/uL (ref 0.0–0.1)
Basophils Relative: 0 %
Eosinophils Absolute: 0 10*3/uL (ref 0.0–0.5)
Eosinophils Relative: 1 %
HCT: 38.8 % — ABNORMAL LOW (ref 39.0–52.0)
Hemoglobin: 12.5 g/dL — ABNORMAL LOW (ref 13.0–17.0)
Immature Granulocytes: 1 %
Lymphocytes Relative: 27 %
Lymphs Abs: 1.4 10*3/uL (ref 0.7–4.0)
MCH: 30.9 pg (ref 26.0–34.0)
MCHC: 32.2 g/dL (ref 30.0–36.0)
MCV: 96 fL (ref 80.0–100.0)
Monocytes Absolute: 0.7 10*3/uL (ref 0.1–1.0)
Monocytes Relative: 12 %
Neutro Abs: 3.1 10*3/uL (ref 1.7–7.7)
Neutrophils Relative %: 59 %
Platelet Count: 174 10*3/uL (ref 150–400)
RBC: 4.04 MIL/uL — ABNORMAL LOW (ref 4.22–5.81)
RDW: 14.2 % (ref 11.5–15.5)
WBC Count: 5.3 10*3/uL (ref 4.0–10.5)
nRBC: 0 % (ref 0.0–0.2)

## 2023-11-08 LAB — CMP (CANCER CENTER ONLY)
ALT: 25 U/L (ref 0–44)
AST: 30 U/L (ref 15–41)
Albumin: 3.6 g/dL (ref 3.5–5.0)
Alkaline Phosphatase: 62 U/L (ref 38–126)
Anion gap: 8 (ref 5–15)
BUN: 19 mg/dL (ref 8–23)
CO2: 24 mmol/L (ref 22–32)
Calcium: 8.4 mg/dL — ABNORMAL LOW (ref 8.9–10.3)
Chloride: 103 mmol/L (ref 98–111)
Creatinine: 0.91 mg/dL (ref 0.61–1.24)
GFR, Estimated: >60 mL/min (ref >60)
Glucose, Bld: 118 mg/dL — ABNORMAL HIGH (ref 70–99)
Potassium: 4.1 mmol/L (ref 3.5–5.1)
Sodium: 135 mmol/L (ref 135–145)
Total Bilirubin: 0.8 mg/dL (ref 0.0–1.2)
Total Protein: 6.4 g/dL — ABNORMAL LOW (ref 6.5–8.1)

## 2023-11-09 ENCOUNTER — Inpatient Hospital Stay

## 2023-12-05 ENCOUNTER — Other Ambulatory Visit: Payer: Self-pay | Admitting: *Deleted

## 2023-12-05 DIAGNOSIS — D473 Essential (hemorrhagic) thrombocythemia: Secondary | ICD-10-CM

## 2023-12-06 ENCOUNTER — Inpatient Hospital Stay: Attending: Internal Medicine

## 2023-12-06 DIAGNOSIS — D473 Essential (hemorrhagic) thrombocythemia: Secondary | ICD-10-CM | POA: Diagnosis present

## 2023-12-06 LAB — CBC WITH DIFFERENTIAL/PLATELET
Abs Immature Granulocytes: 0.08 10*3/uL — ABNORMAL HIGH (ref 0.00–0.07)
Basophils Absolute: 0 10*3/uL (ref 0.0–0.1)
Basophils Relative: 0 %
Eosinophils Absolute: 0.1 10*3/uL (ref 0.0–0.5)
Eosinophils Relative: 2 %
HCT: 39.4 % (ref 39.0–52.0)
Hemoglobin: 12.7 g/dL — ABNORMAL LOW (ref 13.0–17.0)
Immature Granulocytes: 1 %
Lymphocytes Relative: 22 %
Lymphs Abs: 1.4 10*3/uL (ref 0.7–4.0)
MCH: 31.1 pg (ref 26.0–34.0)
MCHC: 32.2 g/dL (ref 30.0–36.0)
MCV: 96.6 fL (ref 80.0–100.0)
Monocytes Absolute: 0.9 10*3/uL (ref 0.1–1.0)
Monocytes Relative: 14 %
Neutro Abs: 3.7 10*3/uL (ref 1.7–7.7)
Neutrophils Relative %: 61 %
Platelets: 184 10*3/uL (ref 150–400)
RBC: 4.08 MIL/uL — ABNORMAL LOW (ref 4.22–5.81)
RDW: 14.3 % (ref 11.5–15.5)
WBC: 6.1 10*3/uL (ref 4.0–10.5)
nRBC: 0 % (ref 0.0–0.2)

## 2023-12-06 LAB — CMP (CANCER CENTER ONLY)
ALT: 22 U/L (ref 0–44)
AST: 27 U/L (ref 15–41)
Albumin: 3.6 g/dL (ref 3.5–5.0)
Alkaline Phosphatase: 63 U/L (ref 38–126)
Anion gap: 7 (ref 5–15)
BUN: 19 mg/dL (ref 8–23)
CO2: 26 mmol/L (ref 22–32)
Calcium: 8.3 mg/dL — ABNORMAL LOW (ref 8.9–10.3)
Chloride: 103 mmol/L (ref 98–111)
Creatinine: 0.94 mg/dL (ref 0.61–1.24)
GFR, Estimated: >60 mL/min (ref >60)
Glucose, Bld: 114 mg/dL — ABNORMAL HIGH (ref 70–99)
Potassium: 4.4 mmol/L (ref 3.5–5.1)
Sodium: 136 mmol/L (ref 135–145)
Total Bilirubin: 0.7 mg/dL (ref 0.0–1.2)
Total Protein: 6.5 g/dL (ref 6.5–8.1)

## 2023-12-07 ENCOUNTER — Inpatient Hospital Stay

## 2024-01-03 ENCOUNTER — Other Ambulatory Visit: Payer: Self-pay

## 2024-01-03 DIAGNOSIS — D473 Essential (hemorrhagic) thrombocythemia: Secondary | ICD-10-CM

## 2024-01-04 ENCOUNTER — Inpatient Hospital Stay: Attending: Internal Medicine

## 2024-01-04 DIAGNOSIS — D473 Essential (hemorrhagic) thrombocythemia: Secondary | ICD-10-CM | POA: Insufficient documentation

## 2024-01-04 LAB — CBC WITH DIFFERENTIAL (CANCER CENTER ONLY)
Abs Immature Granulocytes: 0.42 10*3/uL — ABNORMAL HIGH (ref 0.00–0.07)
Basophils Absolute: 0.1 10*3/uL (ref 0.0–0.1)
Basophils Relative: 1 %
Eosinophils Absolute: 0.1 10*3/uL (ref 0.0–0.5)
Eosinophils Relative: 1 %
HCT: 40 % (ref 39.0–52.0)
Hemoglobin: 12.7 g/dL — ABNORMAL LOW (ref 13.0–17.0)
Immature Granulocytes: 4 %
Lymphocytes Relative: 14 %
Lymphs Abs: 1.4 10*3/uL (ref 0.7–4.0)
MCH: 31.1 pg (ref 26.0–34.0)
MCHC: 31.8 g/dL (ref 30.0–36.0)
MCV: 98 fL (ref 80.0–100.0)
Monocytes Absolute: 1.4 10*3/uL — ABNORMAL HIGH (ref 0.1–1.0)
Monocytes Relative: 14 %
Neutro Abs: 6.6 10*3/uL (ref 1.7–7.7)
Neutrophils Relative %: 66 %
Platelet Count: 249 10*3/uL (ref 150–400)
RBC: 4.08 MIL/uL — ABNORMAL LOW (ref 4.22–5.81)
RDW: 14.6 % (ref 11.5–15.5)
WBC Count: 10 10*3/uL (ref 4.0–10.5)
nRBC: 0 % (ref 0.0–0.2)

## 2024-01-04 LAB — CMP (CANCER CENTER ONLY)
ALT: 34 U/L (ref 0–44)
AST: 36 U/L (ref 15–41)
Albumin: 3.6 g/dL (ref 3.5–5.0)
Alkaline Phosphatase: 68 U/L (ref 38–126)
Anion gap: 7 (ref 5–15)
BUN: 20 mg/dL (ref 8–23)
CO2: 25 mmol/L (ref 22–32)
Calcium: 8.5 mg/dL — ABNORMAL LOW (ref 8.9–10.3)
Chloride: 104 mmol/L (ref 98–111)
Creatinine: 1.01 mg/dL (ref 0.61–1.24)
GFR, Estimated: >60 mL/min (ref >60)
Glucose, Bld: 147 mg/dL — ABNORMAL HIGH (ref 70–99)
Potassium: 4.2 mmol/L (ref 3.5–5.1)
Sodium: 136 mmol/L (ref 135–145)
Total Bilirubin: 0.8 mg/dL (ref 0.0–1.2)
Total Protein: 6.5 g/dL (ref 6.5–8.1)

## 2024-01-10 ENCOUNTER — Ambulatory Visit: Payer: Self-pay

## 2024-01-10 NOTE — Telephone Encounter (Signed)
 Noted

## 2024-01-10 NOTE — Telephone Encounter (Signed)
 FYI Only or Action Required?: FYI only for provider.  Patient was last seen in primary care on 07/03/2023 by Wellington Curtis LABOR, FNP. Called Nurse Triage reporting Sore Throat. Symptoms began a week ago. Interventions attempted: OTC medications: Tylenol  and Rest, hydration, or home remedies. Symptoms are: stable.  Triage Disposition: See PCP When Office is Open (Within 3 Days)  Patient/caregiver understands and will follow disposition?: No, refuses disposition  Copied from CRM [id number]. Topic: Clinical - Red Word Triage >> Jan 10, 2024  3:24 PM Tiffini S wrote: [facility] that prompted transfer to Nurse Triage: Patient had a cough for a week in a half and now have a sore throat. Reason for Disposition  [1] Sore throat with cough/cold symptoms AND [2] present > 5 days  Answer Assessment - Initial Assessment Questions 1. ONSET: When did the throat start hurting? (Hours or days ago)      1.5 weeks  2. SEVERITY: How bad is the sore throat? (Scale 1-10; mild, moderate or severe)   - MILD (1-3):  Doesn't interfere with eating or normal activities.   - MODERATE (4-7): Interferes with eating some solids and normal activities.   - SEVERE (8-10):  Excruciating pain, interferes with most normal activities.   - SEVERE WITH DYSPHAGIA (10): Can't swallow liquids, drooling.     Mild  3. STREP EXPOSURE: Has there been any exposure to strep within the past week? If Yes, ask: What type of contact occurred?      No 4.  VIRAL SYMPTOMS: Are there any symptoms of a cold, such as a runny nose, cough, hoarse voice or red eyes?      Cough that is resolving  5. FEVER: Do you have a fever? If Yes, ask: What is your temperature, how was it measured, and when did it start?     Denies  6. PUS ON THE TONSILS: Is there pus on the tonsils in the back of your throat?     Denies  7. OTHER SYMPTOMS: Do you have any other symptoms? (e.g., difficulty breathing, headache, rash) Denies  Additional info:  attempted to schedule patient for acute visit but was restricted from scheduling, called to [facility] and spoke with [place] who informed this Clinical research associate that patients pcp Kelly Cedar, FNP has left practice and patient did not respond if he wanted to [facility] to another provider at [facility] or would be going elsewhere, he also has not been in office since December, this writer was advised to see if he would like TOC and then schedule with available provider. Patient stated he was already [facility] from his first PCP with [facility] and was not aware the second provider had already left the practice, he elects TOC. This Clinical research associate was unable to open appointments for [facility], called [facility] again for scheduling assistance, placed on hold, checked in with patient to let him know appointment being worked on and first available is not until Monday for acute visit, patient was frustrated by this and states nevermind, talked about urgent care for acute symptoms, patient states he may go to urgent care or will call back next week if symptoms still persisting. Caller disconnected before care advice could be provided. No TOC appointment scheduled at this time.  Protocols used: Sore Throat-A-AH

## 2024-02-01 ENCOUNTER — Inpatient Hospital Stay: Attending: Internal Medicine

## 2024-02-01 DIAGNOSIS — D473 Essential (hemorrhagic) thrombocythemia: Secondary | ICD-10-CM | POA: Diagnosis present

## 2024-02-01 LAB — CBC WITH DIFFERENTIAL (CANCER CENTER ONLY)
Abs Immature Granulocytes: 0.11 K/uL — ABNORMAL HIGH (ref 0.00–0.07)
Basophils Absolute: 0 K/uL (ref 0.0–0.1)
Basophils Relative: 0 %
Eosinophils Absolute: 0 K/uL (ref 0.0–0.5)
Eosinophils Relative: 1 %
HCT: 40.3 % (ref 39.0–52.0)
Hemoglobin: 12.7 g/dL — ABNORMAL LOW (ref 13.0–17.0)
Immature Granulocytes: 1 %
Lymphocytes Relative: 22 %
Lymphs Abs: 1.7 K/uL (ref 0.7–4.0)
MCH: 30.4 pg (ref 26.0–34.0)
MCHC: 31.5 g/dL (ref 30.0–36.0)
MCV: 96.4 fL (ref 80.0–100.0)
Monocytes Absolute: 1.3 K/uL — ABNORMAL HIGH (ref 0.1–1.0)
Monocytes Relative: 17 %
Neutro Abs: 4.5 K/uL (ref 1.7–7.7)
Neutrophils Relative %: 59 %
Platelet Count: 243 K/uL (ref 150–400)
RBC: 4.18 MIL/uL — ABNORMAL LOW (ref 4.22–5.81)
RDW: 14.7 % (ref 11.5–15.5)
WBC Count: 7.7 K/uL (ref 4.0–10.5)
nRBC: 0 % (ref 0.0–0.2)

## 2024-02-01 LAB — CMP (CANCER CENTER ONLY)
ALT: 21 U/L (ref 0–44)
AST: 27 U/L (ref 15–41)
Albumin: 3.5 g/dL (ref 3.5–5.0)
Alkaline Phosphatase: 59 U/L (ref 38–126)
Anion gap: 9 (ref 5–15)
BUN: 22 mg/dL (ref 8–23)
CO2: 26 mmol/L (ref 22–32)
Calcium: 8.8 mg/dL — ABNORMAL LOW (ref 8.9–10.3)
Chloride: 103 mmol/L (ref 98–111)
Creatinine: 0.98 mg/dL (ref 0.61–1.24)
GFR, Estimated: >60 mL/min (ref >60)
Glucose, Bld: 124 mg/dL — ABNORMAL HIGH (ref 70–99)
Potassium: 4.6 mmol/L (ref 3.5–5.1)
Sodium: 138 mmol/L (ref 135–145)
Total Bilirubin: 0.6 mg/dL (ref 0.0–1.2)
Total Protein: 6.3 g/dL — ABNORMAL LOW (ref 6.5–8.1)

## 2024-02-29 ENCOUNTER — Inpatient Hospital Stay: Attending: Internal Medicine

## 2024-02-29 DIAGNOSIS — D473 Essential (hemorrhagic) thrombocythemia: Secondary | ICD-10-CM | POA: Diagnosis present

## 2024-02-29 LAB — CBC WITH DIFFERENTIAL (CANCER CENTER ONLY)
Abs Immature Granulocytes: 0.23 K/uL — ABNORMAL HIGH (ref 0.00–0.07)
Basophils Absolute: 0 K/uL (ref 0.0–0.1)
Basophils Relative: 0 %
Eosinophils Absolute: 0 K/uL (ref 0.0–0.5)
Eosinophils Relative: 1 %
HCT: 39.6 % (ref 39.0–52.0)
Hemoglobin: 12.5 g/dL — ABNORMAL LOW (ref 13.0–17.0)
Immature Granulocytes: 3 %
Lymphocytes Relative: 21 %
Lymphs Abs: 1.7 K/uL (ref 0.7–4.0)
MCH: 30.7 pg (ref 26.0–34.0)
MCHC: 31.6 g/dL (ref 30.0–36.0)
MCV: 97.3 fL (ref 80.0–100.0)
Monocytes Absolute: 1.1 K/uL — ABNORMAL HIGH (ref 0.1–1.0)
Monocytes Relative: 13 %
Neutro Abs: 5 K/uL (ref 1.7–7.7)
Neutrophils Relative %: 62 %
Platelet Count: 268 K/uL (ref 150–400)
RBC: 4.07 MIL/uL — ABNORMAL LOW (ref 4.22–5.81)
RDW: 14.6 % (ref 11.5–15.5)
WBC Count: 8.1 K/uL (ref 4.0–10.5)
nRBC: 0 % (ref 0.0–0.2)

## 2024-02-29 LAB — CMP (CANCER CENTER ONLY)
ALT: 18 U/L (ref 0–44)
AST: 26 U/L (ref 15–41)
Albumin: 3.7 g/dL (ref 3.5–5.0)
Alkaline Phosphatase: 61 U/L (ref 38–126)
Anion gap: 8 (ref 5–15)
BUN: 20 mg/dL (ref 8–23)
CO2: 25 mmol/L (ref 22–32)
Calcium: 8.8 mg/dL — ABNORMAL LOW (ref 8.9–10.3)
Chloride: 104 mmol/L (ref 98–111)
Creatinine: 0.99 mg/dL (ref 0.61–1.24)
GFR, Estimated: >60 mL/min (ref >60)
Glucose, Bld: 113 mg/dL — ABNORMAL HIGH (ref 70–99)
Potassium: 4.6 mmol/L (ref 3.5–5.1)
Sodium: 137 mmol/L (ref 135–145)
Total Bilirubin: 0.7 mg/dL (ref 0.0–1.2)
Total Protein: 6.6 g/dL (ref 6.5–8.1)

## 2024-03-14 ENCOUNTER — Encounter: Payer: Self-pay | Admitting: Dermatology

## 2024-03-14 ENCOUNTER — Ambulatory Visit: Admitting: Dermatology

## 2024-03-14 DIAGNOSIS — D489 Neoplasm of uncertain behavior, unspecified: Secondary | ICD-10-CM

## 2024-03-14 DIAGNOSIS — L820 Inflamed seborrheic keratosis: Secondary | ICD-10-CM | POA: Diagnosis not present

## 2024-03-14 DIAGNOSIS — L821 Other seborrheic keratosis: Secondary | ICD-10-CM

## 2024-03-14 DIAGNOSIS — L578 Other skin changes due to chronic exposure to nonionizing radiation: Secondary | ICD-10-CM

## 2024-03-14 DIAGNOSIS — C4441 Basal cell carcinoma of skin of scalp and neck: Secondary | ICD-10-CM | POA: Diagnosis not present

## 2024-03-14 DIAGNOSIS — W908XXA Exposure to other nonionizing radiation, initial encounter: Secondary | ICD-10-CM | POA: Diagnosis not present

## 2024-03-14 NOTE — Patient Instructions (Addendum)
 Electrodesiccation and Curettage ("Scrape and Burn") Wound Care Instructions  Leave the original bandage on for 24 hours if possible.  If the bandage becomes soaked or soiled before that time, it is OK to remove it and examine the wound.  A small amount of post-operative bleeding is normal.  If excessive bleeding occurs, remove the bandage, place gauze over the site and apply continuous pressure (no peeking) over the area for 30 minutes. If this does not work, please call our clinic as soon as possible or page your doctor if it is after hours.   Once a day, cleanse the wound with soap and water. It is fine to shower. If a thick crust develops you may use a Q-tip dipped into dilute hydrogen peroxide (mix 1:1 with water) to dissolve it.  Hydrogen peroxide can slow the healing process, so use it only as needed.    After washing, apply petroleum jelly (Vaseline) or an antibiotic ointment if your doctor prescribed one for you, followed by a bandage.    For best healing, the wound should be covered with a layer of ointment at all times. If you are not able to keep the area covered with a bandage to hold the ointment in place, this may mean re-applying the ointment several times a day.  Continue this wound care until the wound has healed and is no longer open. It may take several weeks for the wound to heal and close.  Itching and mild discomfort is normal during the healing process.  If you have any discomfort, you can take Tylenol  (acetaminophen ) or ibuprofen as directed on the bottle. (Please do not take these if you have an allergy to them or cannot take them for another reason).  Some redness, tenderness and white or yellow material in the wound is normal healing.  If the area becomes very sore and red, or develops a thick yellow-green material (pus), it may be infected; please notify us .    Wound healing continues for up to one year following surgery. It is not unusual to experience pain in the scar  from time to time during the interval.  If the pain becomes severe or the scar thickens, you should notify the office.    A slight amount of redness in a scar is expected for the first six months.  After six months, the redness will fade and the scar will soften and fade.  The color difference becomes less noticeable with time.  If there are any problems, return for a post-op surgery check at your earliest convenience.  To improve the appearance of the scar, you can use silicone scar gel, cream, or sheets (such as Mederma or Serica) every night for up to one year. These are available over the counter (without a prescription).  Please call our office at [phone] for any questions or concerns.  Biopsy Wound Care Instructions  Leave the original bandage on for 24 hours if possible.  If the bandage becomes soaked or soiled before that time, it is OK to remove it and examine the wound.  A small amount of post-operative bleeding is normal.  If excessive bleeding occurs, remove the bandage, place gauze over the site and apply continuous pressure (no peeking) over the area for 30 minutes. If this does not work, please call our clinic as soon as possible or page your doctor if it is after hours.   Once a day, cleanse the wound with soap and water. It is fine to shower. If  a thick crust develops you may use a Q-tip dipped into dilute hydrogen peroxide (mix 1:1 with water) to dissolve it.  Hydrogen peroxide can slow the healing process, so use it only as needed.    After washing, apply petroleum jelly (Vaseline) or an antibiotic ointment if your doctor prescribed one for you, followed by a bandage.    For best healing, the wound should be covered with a layer of ointment at all times. If you are not able to keep the area covered with a bandage to hold the ointment in place, this may mean re-applying the ointment several times a day.  Continue this wound care until the wound has healed and is no longer  open.   Itching and mild discomfort is normal during the healing process. However, if you develop pain or severe itching, please call our office.   If you have any discomfort, you can take Tylenol  (acetaminophen ) or ibuprofen as directed on the bottle. (Please do not take these if you have an allergy to them or cannot take them for another reason).  Some redness, tenderness and white or yellow material in the wound is normal healing.  If the area becomes very sore and red, or develops a thick yellow-green material (pus), it may be infected; please notify us .    If you have stitches, return to clinic as directed to have the stitches removed. You will continue wound care for 2-3 days after the stitches are removed.   Wound healing continues for up to one year following surgery. It is not unusual to experience pain in the scar from time to time during the interval.  If the pain becomes severe or the scar thickens, you should notify the office.    A slight amount of redness in a scar is expected for the first six months.  After six months, the redness will fade and the scar will soften and fade.  The color difference becomes less noticeable with time.  If there are any problems, return for a post-op surgery check at your earliest convenience.  To improve the appearance of the scar, you can use silicone scar gel, cream, or sheets (such as Mederma or Serica) every night for up to one year. These are available over the counter (without a prescription).  Please call our office at [phone] for any questions or concerns.      Seborrheic Keratosis  What causes seborrheic keratoses? Seborrheic keratoses are harmless, common skin growths that first appear during adult life.  As time goes by, more growths appear.  Some people may develop a large number of them.  Seborrheic keratoses appear on both covered and uncovered body parts.  They are not caused by sunlight.  The tendency to develop seborrheic  keratoses can be inherited.  They vary in color from skin-colored to gray, brown, or even black.  They can be either smooth or have a rough, warty surface.   Seborrheic keratoses are superficial and look as if they were stuck on the skin.  Under the microscope this type of keratosis looks like layers upon layers of skin.  That is why at times the top layer may seem to fall off, but the rest of the growth remains and re-grows.    Treatment Seborrheic keratoses do not need to be treated, but can easily be removed in the office.  Seborrheic keratoses often cause symptoms when they rub on clothing or jewelry.  Lesions can be in the way of shaving.  If  they become inflamed, they can cause itching, soreness, or burning.  Removal of a seborrheic keratosis can be accomplished by freezing, burning, or surgery. If any spot bleeds, scabs, or grows rapidly, please return to have it checked, as these can be an indication of a skin cancer.   Cryotherapy Aftercare  Wash gently with soap and water everyday.   Apply Vaseline and Band-Aid daily until healed.      Due to recent changes in healthcare laws, you may see results of your pathology and/or laboratory studies on MyChart before the doctors have had a chance to review them. We understand that in some cases there may be results that are confusing or concerning to you. Please understand that not all results are received at the same time and often the doctors may need to interpret multiple results in order to provide you with the best plan of care or course of treatment. Therefore, we ask that you please give us  2 business days to thoroughly review all your results before contacting the office for clarification. Should we see a critical lab result, you will be contacted sooner.   If You Need Anything After Your Visit  If you have any questions or concerns for your doctor, please call our main line at [phone] and press option 4 to reach your doctor's  medical assistant. If no one answers, please leave a voicemail as directed and we will return your call as soon as possible. Messages left after 4 pm will be answered the following business day.   You may also send us  a message via MyChart. We typically respond to MyChart messages within 1-2 business days.  For prescription refills, please ask your pharmacy to contact our office. Our fax number is [phone].  If you have an urgent issue when the clinic is closed that cannot wait until the next business day, you can page your doctor at the number below.    Please note that while we do our best to be available for urgent issues outside of office hours, we are not available 24/7.   If you have an urgent issue and are unable to reach us , you may choose to seek medical care at your doctor's office, retail clinic, urgent care center, or emergency room.  If you have a medical emergency, please immediately call 911 or go to the emergency department.  Pager Numbers  - Dr. Hester: [phone]  - Dr. Jackquline: [phone]  - Dr. Claudene: [phone]   - Dr. Raymund: [phone]  In the event of inclement weather, please call our main line at [phone] for an update on the status of any delays or closures.  Dermatology Medication Tips: Please keep the boxes that topical medications come in in order to help keep track of the instructions about where and how to use these. Pharmacies typically print the medication instructions only on the boxes and not directly on the medication tubes.   If your medication is too expensive, please contact our office at [phone] option 4 or send us  a message through MyChart.   We are unable to tell what your co-pay for medications will be in advance as this is different depending on your insurance coverage. However, we may be able to find a substitute medication at lower cost or fill out paperwork to get insurance to cover a needed medication.   If a  prior authorization is required to get your medication covered by your insurance company, please allow us  1-2 business days to complete this  process.  Drug prices often vary depending on where the prescription is filled and some pharmacies may offer cheaper prices.  The website www.goodrx.com contains coupons for medications through different pharmacies. The prices here do not account for what the cost may be with help from insurance (it may be cheaper with your insurance), but the website can give you the price if you did not use any insurance.  - You can print the associated coupon and take it with your prescription to the pharmacy.  - You may also stop by our office during regular business hours and pick up a GoodRx coupon card.  - If you need your prescription sent electronically to a different pharmacy, notify our office through [facility] or by phone at [phone] option 4.     Si Usted Necesita Algo Despus de Su Visita  Tambin puede enviarnos un mensaje a travs de Clinical cytogeneticist. Por lo general respondemos a los mensajes de MyChart en el transcurso de 1 a 2 das hbiles.  Para renovar recetas, por favor pida a su farmacia que se ponga en contacto con nuestra oficina. Randi lakes de fax es [place] [phone].  Si tiene un asunto urgente cuando la clnica est cerrada y que no puede esperar hasta el siguiente da hbil, puede llamar/localizar a su doctor(a) al nmero que aparece a continuacin.   Por favor, tenga en cuenta que aunque hacemos todo lo posible para estar disponibles para asuntos urgentes fuera del horario de [place], no estamos disponibles las 24 horas del da, los 7 [address]  [address] de la [place].   Si tiene un problema urgente y no puede comunicarse con nosotros, puede optar por buscar atencin mdica  en el consultorio de su doctor(a), en una clnica privada, en un centro de atencin urgente o en una sala de emergencias.  Si tiene Engineer, drilling, por favor llame  inmediatamente al 911 o vaya a la sala de emergencias.  Nmeros de bper  - Dr. Hester: [phone]  - Dra. Jackquline: [phone]  - Dr. Claudene: [phone]  - Dra. Kitts: [phone]  En caso de inclemencias del [place], por favor llame a nuestra lnea principal al [phone] para una actualizacin sobre el estado de cualquier retraso o cierre.  Consejos para la medicacin en dermatologa: Por favor, guarde las cajas en las que vienen los medicamentos de uso tpico para ayudarle a seguir las instrucciones sobre dnde y cmo usarlos. Las farmacias generalmente imprimen las instrucciones del medicamento slo en las cajas y no directamente en los tubos del [place].   Si su medicamento es muy caro, por favor, pngase en contacto con landry rieger llamando al [phone] y presione la opcin 4 o envenos un mensaje a travs de Clinical cytogeneticist.   No podemos decirle cul ser su copago por los medicamentos por adelantado ya que esto es diferente dependiendo de la cobertura de su seguro. Sin embargo, es posible que podamos encontrar un medicamento sustituto a Audiological scientist un formulario para que el seguro cubra el medicamento que se considera necesario.   Si se requiere una autorizacin previa para que su compaa de seguros malta su medicamento, por favor permtanos de 1 a 2 das hbiles para completar este proceso.  Los precios de los medicamentos varan con frecuencia dependiendo del Environmental consultant de dnde se surte la receta y alguna farmacias pueden ofrecer precios ms baratos.  El sitio web www.goodrx.com tiene cupones para medicamentos de Health and safety inspector. Los precios aqu no tienen en cuenta lo que podra costar con la  ayuda del seguro (puede ser ms barato con su seguro), pero el sitio web puede darle el precio si no Visual merchandiser.  - Puede imprimir el cupn correspondiente y llevarlo con su receta a la farmacia.  - Tambin puede pasar por nuestra oficina durante el horario  de atencin regular y Education officer, museum una tarjeta de cupones de GoodRx.  - Si necesita que su receta se enve electrnicamente a una farmacia diferente, informe a nuestra oficina a travs de MyChart de [place] o por telfono llamando al [phone] y presione la opcin 4.

## 2024-03-14 NOTE — Progress Notes (Signed)
 Follow-Up Visit   Subjective  Anthony Graves is a 63 y.o. male who presents for the following:  a spot at right neck that bleeds when shaving  Also another spot on front of neck he would like checked.  The patient has spots, moles and lesions to be evaluated, some may be new or changing and the patient may have concern these could be cancer.  The patient has spots, moles and lesions to be evaluated, some may be new or changing and the patient may have concern these could be cancer.  The following portions of the chart were reviewed this encounter and updated as appropriate: medications, allergies, medical history  Review of Systems:  No other skin or systemic complaints except as noted in HPI or Assessment and Plan.  Objective  Well appearing patient in no apparent distress; mood and affect are within normal limits.  A focused examination was performed of the following areas: Right neck, anterior neck  Relevant exam findings are noted in the Assessment and Plan.  right lateral neck x 1 Erythematous stuck-on, waxy papule or plaque right anterior base of neck 0.7 cm red papule    Assessment & Plan   SEBORRHEIC KERATOSIS - Stuck-on, waxy, tan-brown papules and/or plaques  - Benign-appearing - Discussed benign etiology and prognosis. - Observe - Call for any changes  ACTINIC DAMAGE - chronic, secondary to cumulative UV radiation exposure/sun exposure over time - diffuse scaly erythematous macules with underlying dyspigmentation - Recommend daily broad spectrum sunscreen SPF 30+ to sun-exposed areas, reapply every 2 hours as needed.  - Recommend staying in the shade or wearing long sleeves, sun glasses (UVA+UVB protection) and wide brim hats (4-inch brim around the entire circumference of the hat). - Call for new or changing lesions.  INFLAMED SEBORRHEIC KERATOSIS right lateral neck x 1 Symptomatic, irritating, patient would like treated. Destruction of lesion - right  lateral neck x 1 Complexity: simple   Destruction method: cryotherapy   Informed consent: discussed and consent obtained   Timeout:  patient name, date of birth, surgical site, and procedure verified Lesion destroyed using liquid nitrogen: Yes   Region frozen until ice ball extended beyond lesion: Yes   Outcome: patient tolerated procedure well with no complications   Post-procedure details: wound care instructions given    NEOPLASM OF UNCERTAIN BEHAVIOR right anterior base of neck Epidermal / dermal shaving  Lesion diameter (cm):  0.7 Informed consent: discussed and consent obtained   Timeout: patient name, date of birth, surgical site, and procedure verified   Procedure prep:  Patient was prepped and draped in usual sterile fashion Prep type:  Isopropyl alcohol Anesthesia: the lesion was anesthetized in a standard fashion   Anesthetic:  1% lidocaine  w/ epinephrine 1-100,000 buffered w/ 8.4% NaHCO3 Instrument used: flexible razor blade   Hemostasis achieved with: pressure, aluminum chloride and electrodesiccation   Outcome: patient tolerated procedure well   Post-procedure details: sterile dressing applied and wound care instructions given   Dressing type: bandage and petrolatum    Destruction of lesion Complexity: extensive   Destruction method: electrodesiccation and curettage   Informed consent: discussed and consent obtained   Timeout:  patient name, date of birth, surgical site, and procedure verified Procedure prep:  Patient was prepped and draped in usual sterile fashion Prep type:  Isopropyl alcohol Anesthesia: the lesion was anesthetized in a standard fashion   Anesthetic:  1% lidocaine  w/ epinephrine 1-100,000 buffered w/ 8.4% NaHCO3 Curettage performed in three different directions: Yes  Electrodesiccation performed over the curetted area: Yes   Lesion length (cm):  0.7 Lesion width (cm):  0.7 Margin per side (cm):  0.2 Final wound size (cm):  1.1 Hemostasis  achieved with:  pressure, aluminum chloride and electrodesiccation Outcome: patient tolerated procedure well with no complications   Post-procedure details: sterile dressing applied and wound care instructions given   Dressing type: bandage and petrolatum    Specimen 1 - Surgical pathology Differential Diagnosis: r/o bcc  ED&C done  Check Margins: No R/o bcc ACTINIC SKIN DAMAGE   SEBORRHEIC KERATOSIS    Return for keep follow up as scheduled .  IEleanor Blush, CMA, am acting as scribe for Alm Rhyme, MD.   Documentation: I have reviewed the above documentation for accuracy and completeness, and I agree with the above.  Alm Rhyme, MD

## 2024-03-19 LAB — SURGICAL PATHOLOGY

## 2024-03-20 ENCOUNTER — Encounter: Payer: Self-pay | Admitting: Dermatology

## 2024-03-20 ENCOUNTER — Ambulatory Visit: Payer: Self-pay | Admitting: Dermatology

## 2024-03-20 NOTE — Telephone Encounter (Signed)
-----   Message from Alm Rhyme sent at 03/20/2024  1:51 PM EDT ----- FINAL DIAGNOSIS        1. Skin, right anterior base of neck :       BASAL CELL CARCINOMA, NODULAR PATTERN   Cancer = BCC Already treated Recheck next visit ----- Message ----- From: Interface, Lab In Three Zero One Sent: 03/19/2024   4:49 PM EDT To: Alm JAYSON Rhyme, MD

## 2024-03-20 NOTE — Telephone Encounter (Signed)
 Advised pt of bx result/sh ?

## 2024-04-11 ENCOUNTER — Inpatient Hospital Stay (HOSPITAL_BASED_OUTPATIENT_CLINIC_OR_DEPARTMENT_OTHER): Admitting: Internal Medicine

## 2024-04-11 ENCOUNTER — Encounter: Payer: Self-pay | Admitting: Internal Medicine

## 2024-04-11 ENCOUNTER — Inpatient Hospital Stay: Attending: Internal Medicine

## 2024-04-11 VITALS — BP 137/66 | HR 69 | Temp 97.1°F | Resp 18 | Ht 70.0 in | Wt 173.6 lb

## 2024-04-11 DIAGNOSIS — Z79899 Other long term (current) drug therapy: Secondary | ICD-10-CM | POA: Insufficient documentation

## 2024-04-11 DIAGNOSIS — D473 Essential (hemorrhagic) thrombocythemia: Secondary | ICD-10-CM | POA: Insufficient documentation

## 2024-04-11 DIAGNOSIS — Z7982 Long term (current) use of aspirin: Secondary | ICD-10-CM | POA: Insufficient documentation

## 2024-04-11 DIAGNOSIS — M109 Gout, unspecified: Secondary | ICD-10-CM | POA: Diagnosis not present

## 2024-04-11 LAB — CBC WITH DIFFERENTIAL (CANCER CENTER ONLY)
Abs Immature Granulocytes: 0.21 K/uL — ABNORMAL HIGH (ref 0.00–0.07)
Basophils Absolute: 0 K/uL (ref 0.0–0.1)
Basophils Relative: 0 %
Eosinophils Absolute: 0.1 K/uL (ref 0.0–0.5)
Eosinophils Relative: 1 %
HCT: 39.7 % (ref 39.0–52.0)
Hemoglobin: 12.8 g/dL — ABNORMAL LOW (ref 13.0–17.0)
Immature Granulocytes: 2 %
Lymphocytes Relative: 14 %
Lymphs Abs: 1.5 K/uL (ref 0.7–4.0)
MCH: 30.5 pg (ref 26.0–34.0)
MCHC: 32.2 g/dL (ref 30.0–36.0)
MCV: 94.5 fL (ref 80.0–100.0)
Monocytes Absolute: 1.5 K/uL — ABNORMAL HIGH (ref 0.1–1.0)
Monocytes Relative: 15 %
Neutro Abs: 7 K/uL (ref 1.7–7.7)
Neutrophils Relative %: 68 %
Platelet Count: 272 K/uL (ref 150–400)
RBC: 4.2 MIL/uL — ABNORMAL LOW (ref 4.22–5.81)
RDW: 15.1 % (ref 11.5–15.5)
WBC Count: 10.3 K/uL (ref 4.0–10.5)
nRBC: 0 % (ref 0.0–0.2)

## 2024-04-11 LAB — CMP (CANCER CENTER ONLY)
ALT: 19 U/L (ref 0–44)
AST: 29 U/L (ref 15–41)
Albumin: 4 g/dL (ref 3.5–5.0)
Alkaline Phosphatase: 65 U/L (ref 38–126)
Anion gap: 8 (ref 5–15)
BUN: 20 mg/dL (ref 8–23)
CO2: 25 mmol/L (ref 22–32)
Calcium: 8.9 mg/dL (ref 8.9–10.3)
Chloride: 103 mmol/L (ref 98–111)
Creatinine: 1.05 mg/dL (ref 0.61–1.24)
GFR, Estimated: >60 mL/min (ref >60)
Glucose, Bld: 98 mg/dL (ref 70–99)
Potassium: 4.6 mmol/L (ref 3.5–5.1)
Sodium: 136 mmol/L (ref 135–145)
Total Bilirubin: 0.8 mg/dL (ref 0.0–1.2)
Total Protein: 6.9 g/dL (ref 6.5–8.1)

## 2024-04-11 LAB — URIC ACID: Uric Acid, Serum: 5.3 mg/dL (ref 3.7–8.6)

## 2024-04-11 LAB — LACTATE DEHYDROGENASE: LDH: 245 U/L — ABNORMAL HIGH (ref 98–192)

## 2024-04-11 NOTE — Progress Notes (Signed)
 Patient has no new or acute concerns at this time.

## 2024-04-11 NOTE — Assessment & Plan Note (Addendum)
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 FEB 2023-bone marrow biopsy shows 80% hypercellularity; with fibrosis grade 2; no evidence of any blasts or concerns for progression obvious myelofibrosis.  DISCONTINUED anagrelide  sec to concerns of myelofibrosis.  # Patient currently on pegylated interferon [[facility]; dr.Reeves; March 2024]-on currently every 2 week basis [since JULY 2024].  Platelet counts consistently in the 160-170 range.  Hemoglobin 12 white count normal. Continue aspirin 81mg  po daily.  Defer to [facility] regarding titration of pegylated interferon/ and repeating bone marrow biopsy. Awaiting re-evaluation with [facility]- appt next in next month.   # Hypocalcemia: 9.0 - recommend ca+ vit D OTC- stable.  # History of gout- left big toe-allopurinol [s/p rheumatology].  Stable.  # Elevated PSA/prostate cancer ~ [Dr.Stoiff]- MRI/[facility]- currently on surveillance-slow rise noted May 2024 PSA 9. S/p  urology next week- [facility]- Stable.  # DISPOSITION:  # labs- CBC/CMP q 4 weeks x 6. # Follow-up in  6 months; MD; labs-CBC CMP/LDH/IURIC acid-Dr.B  Cc:   Dr. Bertrum.

## 2024-04-11 NOTE — Progress Notes (Signed)
 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  Patient continues to be asymptomatic.  LDH slightly elevated.    # MAY 2023-second opinion-[facility] Dr. Jerrye; DISCONTINUED anagrelide  donalynn to concern for bone marrow fibrosis]; rising LDH; on aspirin only   DIAGNOSIS: ET    Essential thrombocytosis (HCC)   INTERVAL HISTORY: Ambulating independently.  Alone.  Patient 31 very pleasant male who returns to clinic for follow-up of essential thrombocytosis, CALR mutation, with concerns for myelofibrosis-currently on aspirin and peg interfon alfa [Dr.Reeves; [facility]] lone is here for follow-up.  Patient has no new or acute concerns at this time   Patient is doing well in the interim. No side effects from any medications. Appetite and energy are normal. Denies any pain..   Patient denies any bleeding. Patient denies any fatigue.  Denies any myalgias.  Denies any shortness of breath or weight loss or night sweats.   Review of Systems  Constitutional:  Negative for chills, diaphoresis, fever, malaise/fatigue and weight loss.  HENT:  Negative for nosebleeds and sore throat.   Eyes:  Negative for double  vision.  Respiratory:  Negative for cough, hemoptysis, sputum production, shortness of breath and wheezing.   Cardiovascular:  Negative for chest pain, palpitations, orthopnea and leg swelling.  Gastrointestinal:  Negative for abdominal pain, blood in stool, constipation, diarrhea, heartburn, melena, nausea and vomiting.  Genitourinary:  Negative for dysuria, frequency and urgency.  Musculoskeletal:  Negative for back pain and joint pain.  Skin: Negative.  Negative for itching and rash.  Neurological:  Negative for dizziness, tingling, focal weakness, weakness and headaches.  Endo/Heme/Allergies:  Does not bruise/bleed easily.  Psychiatric/Behavioral:  Negative for depression. The patient is not nervous/anxious and does not have insomnia.    PAST MEDICAL HISTORY :  Past Medical History:  Diagnosis Date   Arthritis    osteoartiritis   Basal cell carcinoma 10/12/2009   Left post. shoulder sup. lateral scapula, 3cm med. to hemangioma.    Basal cell carcinoma 02/26/2015   Right temple. Nodular.   Basal cell carcinoma 03/21/2019   Right inf. med. pectoral. Nodular. EDC   Basal cell carcinoma 03/21/2019   Left lat. abdomen parallel lat. to umbilicus. Micronodular. EDC.   Basal cell carcinoma 03/14/2024   right anterior base of neck, EDC   Dysplastic nevus 01/28/2008   Left upper back medial sup scapula. Moderate to severe atypia, margins involved. Excised Graves/18/2009, persistent DN, close to margin.   Dysplastic nevus 07/01/2010   Left low back paraspinal lat. Mild atypiua, margins involved.    Dysplastic nevus 07/01/2010   Left low back paraspinal me. Mild atypia, margins involved.    Gout    High platelet count    Prostate cancer (HCC)    per pt    PAST SURGICAL HISTORY :   Past Surgical History:  Procedure Laterality Date   BONE MARROW  BIOPSY     COLONOSCOPY  2016   CYSTECTOMY  years ago   pilonidal cyst removed   KNEE SURGERY Right 20 years ago   arthrocscopy   TOTAL KNEE  ARTHROPLASTY Right 08/15/2016   Procedure: RIGHT TOTAL KNEE ARTHROPLASTY;  Surgeon: Dempsey Moan, Anthony;  Location: [facility];  Service: Orthopedics;  Laterality: Right;   FAMILY HISTORY :  History reviewed. No pertinent family history.  SOCIAL HISTORY:   Social History   Tobacco Use   Smoking status: Never   Smokeless tobacco: Never  Vaping Use   Vaping status: Never Used  Substance Use Topics   Alcohol use: Yes    Alcohol/week: 3.0 - 7.0 standard drinks of alcohol    Types: 3 - 7 Glasses of wine per week   Drug use: No    ALLERGIES:  is allergic to penicillins.  MEDICATIONS:  Current Outpatient Medications  Medication Sig Dispense Refill   allopurinol (ZYLOPRIM) 100 MG tablet Take 300 mg by mouth daily.     aspirin EC 81 MG tablet Take 1 tablet by mouth daily.     peginterferon alfa-2a (PEGASYS) 180 MCG/ML injection Inject 180 mcg into the skin every 14 (fourteen) days.     tamsulosin  (FLOMAX ) 0.4 MG CAPS capsule TAKE 1 CAPSULE BY MOUTH EVERY DAY 90 capsule 3   No current facility-administered medications for this visit.    PHYSICAL EXAMINATION: ECOG PERFORMANCE STATUS: 0 - Asymptomatic  BP 137/66 (BP Location: Left Arm, Patient Position: Sitting)   Pulse 69   Temp (!) 97.1 F (36.2 C)   Resp 18   Ht 5' 10 (1.778 m)   Wt 173 lb 9.6 oz (78.7 kg)   SpO2 100%   BMI 24.91 kg/m   Filed Weights   04/11/24 0938  Weight: 173 lb 9.6 oz (78.7 kg)      Physical Exam Constitutional:      Appearance: He is not ill-appearing.  Eyes:     General: No scleral icterus.    Conjunctiva/sclera: Conjunctivae normal.  Cardiovascular:     Rate and Rhythm: Normal rate and regular rhythm.  Abdominal:     General: There is no distension.     Palpations: Abdomen is soft.     Tenderness: There is no abdominal tenderness. There is no guarding.  Musculoskeletal:        General: No deformity.     Right lower leg: No edema.     Left lower leg: No edema.  Lymphadenopathy:      Cervical: No cervical adenopathy.  Skin:    General: Skin is warm and dry.  Neurological:     Mental Status: He is alert and oriented to person, place, and time. Mental status is at baseline.  Psychiatric:        Mood and Affect: Mood normal.        Behavior: Behavior normal.     LABORATORY DATA:  I have reviewed the data as listed    Component Value Date/Time   NA 136 04/11/2024 0920   NA 142 07/27/2022 0854   K 4.6 04/11/2024 0920   CL 103 04/11/2024 0920   CO2 25 04/11/2024 0920   GLUCOSE 98 04/11/2024 0920   BUN 20 04/11/2024 0920   BUN 7 (L) 07/27/2022 0854   CREATININE 1.05 04/11/2024 0920   CREATININE 1.10 04/25/2014 0823   CALCIUM Graves.9 04/11/2024 0920   PROT 6.9 04/11/2024 0920   PROT 6.2 07/27/2022 0854   PROT 6.7 04/25/2014 0823  ALBUMIN 4.0 04/11/2024 0920   ALBUMIN 4.2 07/27/2022 0854   ALBUMIN 3.Graves 04/25/2014 0823   AST 29 04/11/2024 0920   ALT 19 04/11/2024 0920   ALT 26 04/25/2014 0823   ALKPHOS 65 04/11/2024 0920   ALKPHOS 44 (L) 04/25/2014 0823   BILITOT 0.Graves 04/11/2024 0920   GFRNONAA >60 04/11/2024 0920   GFRNONAA >60 04/25/2014 0823   GFRNONAA >60 02/04/2014 0830   GFRAA >60 12/25/2019 1324   GFRAA >60 04/25/2014 0823   GFRAA >60 02/04/2014 0830   No results found for: SPEP, UPEP  Lab Results  Component Value Date   WBC 10.3 04/11/2024   NEUTROABS 7.0 04/11/2024   HGB 12.Graves (L) 04/11/2024   HCT 39.7 04/11/2024   MCV 94.5 04/11/2024   PLT 272 04/11/2024    ASSESSMENT & PLAN:   Essential thrombocytosis (HCC) #1 Essential thrombocythemia,  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 FEB 2023-bone marrow biopsy shows 80% hypercellularity; with fibrosis grade 2; no evidence of any blasts or concerns for progression obvious myelofibrosis.  DISCONTINUED anagrelide  sec to concerns of myelofibrosis.  # Patient currently on pegylated interferon [[facility]; dr.Reeves; March 2024]-on currently every 2 week basis [since  JULY 2024].  Platelet counts consistently in the 160-170 range.  Hemoglobin 12 white count normal. Continue aspirin 81mg  po daily.  Defer to [facility] regarding titration of pegylated interferon/ and repeating bone marrow biopsy. Awaiting re-evaluation with [facility]- appt next in next month.   # Hypocalcemia: 9.0 - recommend ca+ vit D OTC- stable.  # History of gout- left big toe-allopurinol [s/p rheumatology].  Stable.  # Elevated PSA/prostate cancer ~ [Dr.Stoiff]- MRI/[facility]- currently on surveillance-slow rise noted May 2024 PSA 9. S/p  urology next week- [facility]- Stable.  # DISPOSITION:  # labs- CBC/CMP q 4 weeks x 6. # Follow-up in  6 months; Anthony; labs-CBC CMP/LDH/IURIC acid-Dr.B  Cc:   Dr. Bertrum.       Cindy JONELLE Joe, Anthony 04/11/2024 10:27 AM

## 2024-05-09 ENCOUNTER — Inpatient Hospital Stay

## 2024-05-16 ENCOUNTER — Inpatient Hospital Stay: Attending: Internal Medicine

## 2024-05-27 ENCOUNTER — Other Ambulatory Visit: Payer: Self-pay | Admitting: Urology

## 2024-05-29 ENCOUNTER — Other Ambulatory Visit: Payer: Self-pay | Admitting: Urology

## 2024-05-30 ENCOUNTER — Other Ambulatory Visit: Payer: Self-pay | Admitting: Urology

## 2024-06-06 ENCOUNTER — Inpatient Hospital Stay: Attending: Internal Medicine

## 2024-06-06 DIAGNOSIS — D473 Essential (hemorrhagic) thrombocythemia: Secondary | ICD-10-CM | POA: Insufficient documentation

## 2024-06-06 LAB — CBC WITH DIFFERENTIAL (CANCER CENTER ONLY)
Abs Immature Granulocytes: 0.23 K/uL — ABNORMAL HIGH (ref 0.00–0.07)
Basophils Absolute: 0 K/uL (ref 0.0–0.1)
Basophils Relative: 0 %
Eosinophils Absolute: 0.2 K/uL (ref 0.0–0.5)
Eosinophils Relative: 2 %
HCT: 37.8 % — ABNORMAL LOW (ref 39.0–52.0)
Hemoglobin: 12.3 g/dL — ABNORMAL LOW (ref 13.0–17.0)
Immature Granulocytes: 3 %
Lymphocytes Relative: 18 %
Lymphs Abs: 1.4 K/uL (ref 0.7–4.0)
MCH: 30.6 pg (ref 26.0–34.0)
MCHC: 32.5 g/dL (ref 30.0–36.0)
MCV: 94 fL (ref 80.0–100.0)
Monocytes Absolute: 1.2 K/uL — ABNORMAL HIGH (ref 0.1–1.0)
Monocytes Relative: 15 %
Neutro Abs: 5 K/uL (ref 1.7–7.7)
Neutrophils Relative %: 62 %
Platelet Count: 259 K/uL (ref 150–400)
RBC: 4.02 MIL/uL — ABNORMAL LOW (ref 4.22–5.81)
RDW: 15.2 % (ref 11.5–15.5)
WBC Count: 8.1 K/uL (ref 4.0–10.5)
nRBC: 0 % (ref 0.0–0.2)

## 2024-06-06 LAB — CMP (CANCER CENTER ONLY)
ALT: 18 U/L (ref 0–44)
AST: 26 U/L (ref 15–41)
Albumin: 3.5 g/dL (ref 3.5–5.0)
Alkaline Phosphatase: 56 U/L (ref 38–126)
Anion gap: 8 (ref 5–15)
BUN: 16 mg/dL (ref 8–23)
CO2: 26 mmol/L (ref 22–32)
Calcium: 8.5 mg/dL — ABNORMAL LOW (ref 8.9–10.3)
Chloride: 105 mmol/L (ref 98–111)
Creatinine: 1.03 mg/dL (ref 0.61–1.24)
GFR, Estimated: >60 mL/min (ref >60)
Glucose, Bld: 114 mg/dL — ABNORMAL HIGH (ref 70–99)
Potassium: 4.5 mmol/L (ref 3.5–5.1)
Sodium: 139 mmol/L (ref 135–145)
Total Bilirubin: 0.6 mg/dL (ref 0.0–1.2)
Total Protein: 6.3 g/dL — ABNORMAL LOW (ref 6.5–8.1)

## 2024-07-01 ENCOUNTER — Inpatient Hospital Stay: Attending: Internal Medicine

## 2024-07-01 DIAGNOSIS — D473 Essential (hemorrhagic) thrombocythemia: Secondary | ICD-10-CM | POA: Insufficient documentation

## 2024-07-01 LAB — CMP (CANCER CENTER ONLY)
ALT: 15 U/L (ref 0–44)
AST: 29 U/L (ref 15–41)
Albumin: 4 g/dL (ref 3.5–5.0)
Alkaline Phosphatase: 70 U/L (ref 38–126)
Anion gap: 8 (ref 5–15)
BUN: 15 mg/dL (ref 8–23)
CO2: 29 mmol/L (ref 22–32)
Calcium: 9.5 mg/dL (ref 8.9–10.3)
Chloride: 103 mmol/L (ref 98–111)
Creatinine: 1.02 mg/dL (ref 0.61–1.24)
GFR, Estimated: >60 mL/min (ref >60)
Glucose, Bld: 104 mg/dL — ABNORMAL HIGH (ref 70–99)
Potassium: 4.4 mmol/L (ref 3.5–5.1)
Sodium: 140 mmol/L (ref 135–145)
Total Bilirubin: 0.4 mg/dL (ref 0.0–1.2)
Total Protein: 6.7 g/dL (ref 6.5–8.1)

## 2024-07-01 LAB — CBC WITH DIFFERENTIAL (CANCER CENTER ONLY)
Abs Immature Granulocytes: 0.71 K/uL — ABNORMAL HIGH (ref 0.00–0.07)
Basophils Absolute: 0.1 K/uL (ref 0.0–0.1)
Basophils Relative: 1 %
Eosinophils Absolute: 0.1 K/uL (ref 0.0–0.5)
Eosinophils Relative: 1 %
HCT: 37.9 % — ABNORMAL LOW (ref 39.0–52.0)
Hemoglobin: 12 g/dL — ABNORMAL LOW (ref 13.0–17.0)
Immature Granulocytes: 6 %
Lymphocytes Relative: 15 %
Lymphs Abs: 1.8 K/uL (ref 0.7–4.0)
MCH: 30.5 pg (ref 26.0–34.0)
MCHC: 31.7 g/dL (ref 30.0–36.0)
MCV: 96.4 fL (ref 80.0–100.0)
Monocytes Absolute: 1.3 K/uL — ABNORMAL HIGH (ref 0.1–1.0)
Monocytes Relative: 10 %
Neutro Abs: 8.2 K/uL — ABNORMAL HIGH (ref 1.7–7.7)
Neutrophils Relative %: 67 %
Platelet Count: 504 K/uL — ABNORMAL HIGH (ref 150–400)
RBC: 3.93 MIL/uL — ABNORMAL LOW (ref 4.22–5.81)
RDW: 15.1 % (ref 11.5–15.5)
WBC Count: 12.2 K/uL — ABNORMAL HIGH (ref 4.0–10.5)
nRBC: 0 % (ref 0.0–0.2)

## 2024-07-01 LAB — LACTATE DEHYDROGENASE: LDH: 399 U/L — ABNORMAL HIGH (ref 105–235)

## 2024-07-01 LAB — URIC ACID: Uric Acid, Serum: 4.8 mg/dL (ref 3.7–8.6)

## 2024-07-02 ENCOUNTER — Encounter: Payer: Self-pay | Admitting: Internal Medicine

## 2024-07-03 ENCOUNTER — Telehealth: Payer: Self-pay | Admitting: Internal Medicine

## 2024-07-03 ENCOUNTER — Other Ambulatory Visit: Payer: Self-pay | Admitting: *Deleted

## 2024-07-03 DIAGNOSIS — C61 Malignant neoplasm of prostate: Secondary | ICD-10-CM

## 2024-07-03 DIAGNOSIS — D473 Essential (hemorrhagic) thrombocythemia: Secondary | ICD-10-CM

## 2024-07-03 NOTE — Telephone Encounter (Signed)
 Patient had delay in getting his Pegasys injection-because of insurance reasons.  He is currently getting every 2 weeks  Spoke to patient regarding his concerns of elevated platelets LDH; recommend proceeding with shots as planned-December 29 and January 13-proceed with labs-as scheduled for January 15th.  Make sure LDH is added to the labs on January 15th.   Thank you, GB

## 2024-07-04 ENCOUNTER — Inpatient Hospital Stay

## 2024-08-01 ENCOUNTER — Inpatient Hospital Stay: Attending: Internal Medicine

## 2024-08-01 DIAGNOSIS — D473 Essential (hemorrhagic) thrombocythemia: Secondary | ICD-10-CM | POA: Diagnosis present

## 2024-08-01 DIAGNOSIS — C61 Malignant neoplasm of prostate: Secondary | ICD-10-CM

## 2024-08-01 LAB — CBC WITH DIFFERENTIAL (CANCER CENTER ONLY)
Abs Immature Granulocytes: 0.18 K/uL — ABNORMAL HIGH (ref 0.00–0.07)
Basophils Absolute: 0 K/uL (ref 0.0–0.1)
Basophils Relative: 0 %
Eosinophils Absolute: 0 K/uL (ref 0.0–0.5)
Eosinophils Relative: 1 %
HCT: 39.6 % (ref 39.0–52.0)
Hemoglobin: 12.6 g/dL — ABNORMAL LOW (ref 13.0–17.0)
Immature Granulocytes: 2 %
Lymphocytes Relative: 21 %
Lymphs Abs: 1.8 K/uL (ref 0.7–4.0)
MCH: 30 pg (ref 26.0–34.0)
MCHC: 31.8 g/dL (ref 30.0–36.0)
MCV: 94.3 fL (ref 80.0–100.0)
Monocytes Absolute: 1.5 K/uL — ABNORMAL HIGH (ref 0.1–1.0)
Monocytes Relative: 18 %
Neutro Abs: 4.7 K/uL (ref 1.7–7.7)
Neutrophils Relative %: 58 %
Platelet Count: 327 K/uL (ref 150–400)
RBC: 4.2 MIL/uL — ABNORMAL LOW (ref 4.22–5.81)
RDW: 15.4 % (ref 11.5–15.5)
WBC Count: 8.2 K/uL (ref 4.0–10.5)
nRBC: 0 % (ref 0.0–0.2)

## 2024-08-01 LAB — CMP (CANCER CENTER ONLY)
ALT: 23 U/L (ref 0–44)
AST: 34 U/L (ref 15–41)
Albumin: 4.1 g/dL (ref 3.5–5.0)
Alkaline Phosphatase: 71 U/L (ref 38–126)
Anion gap: 9 (ref 5–15)
BUN: 18 mg/dL (ref 8–23)
CO2: 27 mmol/L (ref 22–32)
Calcium: 9.2 mg/dL (ref 8.9–10.3)
Chloride: 102 mmol/L (ref 98–111)
Creatinine: 1.05 mg/dL (ref 0.61–1.24)
GFR, Estimated: >60 mL/min
Glucose, Bld: 136 mg/dL — ABNORMAL HIGH (ref 70–99)
Potassium: 5.2 mmol/L — ABNORMAL HIGH (ref 3.5–5.1)
Sodium: 138 mmol/L (ref 135–145)
Total Bilirubin: 0.5 mg/dL (ref 0.0–1.2)
Total Protein: 6.5 g/dL (ref 6.5–8.1)

## 2024-08-01 LAB — LACTATE DEHYDROGENASE: LDH: 348 U/L — ABNORMAL HIGH (ref 105–235)

## 2024-08-29 ENCOUNTER — Inpatient Hospital Stay: Attending: Internal Medicine

## 2024-09-12 ENCOUNTER — Ambulatory Visit: Admitting: Dermatology

## 2024-10-09 ENCOUNTER — Other Ambulatory Visit

## 2024-10-09 ENCOUNTER — Ambulatory Visit: Admitting: Internal Medicine
